# Patient Record
Sex: Male | Born: 1980 | Hispanic: No | Marital: Single | Smoking: Never smoker
Health system: Southern US, Community
[De-identification: ages and names within clinical notes are randomized; demographics above are authoritative.]

## PROBLEM LIST (undated history)

## (undated) DIAGNOSIS — F419 Anxiety disorder, unspecified: Secondary | ICD-10-CM

## (undated) DIAGNOSIS — F447 Conversion disorder with mixed symptom presentation: Secondary | ICD-10-CM

## (undated) DIAGNOSIS — S060X9A Concussion with loss of consciousness of unspecified duration, initial encounter: Secondary | ICD-10-CM

## (undated) DIAGNOSIS — E559 Vitamin D deficiency, unspecified: Secondary | ICD-10-CM

## (undated) DIAGNOSIS — F32A Depression, unspecified: Secondary | ICD-10-CM

## (undated) DIAGNOSIS — Z789 Other specified health status: Secondary | ICD-10-CM

## (undated) HISTORY — DX: Conversion disorder with mixed symptom presentation: F44.7

## (undated) HISTORY — DX: Vitamin D deficiency, unspecified: E55.9

## (undated) HISTORY — DX: Anxiety disorder, unspecified: F41.9

## (undated) HISTORY — DX: Depression, unspecified: F32.A

## (undated) HISTORY — PX: NO PAST SURGERIES: SHX2092

---
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
## 2019-01-07 ENCOUNTER — Emergency Department (HOSPITAL_COMMUNITY)
Admission: EM | Admit: 2019-01-07 | Discharge: 2019-01-07 | Disposition: A | Discharge status: Home / Self Care | Payer: BC Managed Care – PPO | Attending: Emergency Medicine | Admitting: Emergency Medicine

## 2019-01-07 ENCOUNTER — Other Ambulatory Visit: Payer: Self-pay

## 2019-01-07 ENCOUNTER — Emergency Department (HOSPITAL_COMMUNITY): Payer: BC Managed Care – PPO

## 2019-01-07 ENCOUNTER — Encounter (HOSPITAL_COMMUNITY): Payer: Self-pay

## 2019-01-07 DIAGNOSIS — Y929 Unspecified place or not applicable: Secondary | ICD-10-CM | POA: Diagnosis not present

## 2019-01-07 DIAGNOSIS — M546 Pain in thoracic spine: Secondary | ICD-10-CM | POA: Insufficient documentation

## 2019-01-07 DIAGNOSIS — R109 Unspecified abdominal pain: Secondary | ICD-10-CM | POA: Insufficient documentation

## 2019-01-07 DIAGNOSIS — T07XXXA Unspecified multiple injuries, initial encounter: Secondary | ICD-10-CM | POA: Diagnosis present

## 2019-01-07 DIAGNOSIS — S20419A Abrasion of unspecified back wall of thorax, initial encounter: Secondary | ICD-10-CM | POA: Insufficient documentation

## 2019-01-07 DIAGNOSIS — S8012XA Contusion of left lower leg, initial encounter: Secondary | ICD-10-CM | POA: Insufficient documentation

## 2019-01-07 DIAGNOSIS — S7001XA Contusion of right hip, initial encounter: Secondary | ICD-10-CM

## 2019-01-07 DIAGNOSIS — S0101XA Laceration without foreign body of scalp, initial encounter: Secondary | ICD-10-CM

## 2019-01-07 DIAGNOSIS — R51 Headache: Secondary | ICD-10-CM | POA: Diagnosis not present

## 2019-01-07 DIAGNOSIS — Y939 Activity, unspecified: Secondary | ICD-10-CM | POA: Insufficient documentation

## 2019-01-07 DIAGNOSIS — Y999 Unspecified external cause status: Secondary | ICD-10-CM | POA: Insufficient documentation

## 2019-01-07 DIAGNOSIS — Z23 Encounter for immunization: Secondary | ICD-10-CM | POA: Diagnosis not present

## 2019-01-07 HISTORY — DX: Other specified health status: Z78.9

## 2019-01-07 LAB — LACTIC ACID, PLASMA: Lactic Acid, Venous: 2.3 mmol/L (ref 0.5–1.9)

## 2019-01-07 LAB — I-STAT CHEM 8, ED
BUN: 14 mg/dL (ref 6–20)
Calcium, Ion: 1.14 mmol/L — ABNORMAL LOW (ref 1.15–1.40)
Chloride: 103 mmol/L (ref 98–111)
Creatinine, Ser: 1.1 mg/dL (ref 0.61–1.24)
Glucose, Bld: 109 mg/dL — ABNORMAL HIGH (ref 70–99)
HCT: 42 % (ref 39.0–52.0)
Hemoglobin: 14.3 g/dL (ref 13.0–17.0)
Potassium: 3.3 mmol/L — ABNORMAL LOW (ref 3.5–5.1)
Sodium: 138 mmol/L (ref 135–145)
TCO2: 27 mmol/L (ref 22–32)

## 2019-01-07 LAB — COMPREHENSIVE METABOLIC PANEL
ALT: 18 U/L (ref 0–44)
AST: 20 U/L (ref 15–41)
Albumin: 3.8 g/dL (ref 3.5–5.0)
Alkaline Phosphatase: 46 U/L (ref 38–126)
Anion gap: 10 (ref 5–15)
BUN: 12 mg/dL (ref 6–20)
CO2: 24 mmol/L (ref 22–32)
Calcium: 9.2 mg/dL (ref 8.9–10.3)
Chloride: 103 mmol/L (ref 98–111)
Creatinine, Ser: 0.94 mg/dL (ref 0.61–1.24)
GFR calc Af Amer: >60 mL/min (ref >60)
GFR calc non Af Amer: >60 mL/min (ref >60)
Glucose, Bld: 111 mg/dL — ABNORMAL HIGH (ref 70–99)
Potassium: 3.3 mmol/L — ABNORMAL LOW (ref 3.5–5.1)
Sodium: 137 mmol/L (ref 135–145)
Total Bilirubin: 0.9 mg/dL (ref 0.3–1.2)
Total Protein: 6.8 g/dL (ref 6.5–8.1)

## 2019-01-07 LAB — CBC
HCT: 42.1 % (ref 39.0–52.0)
Hemoglobin: 14.2 g/dL (ref 13.0–17.0)
MCH: 31.2 pg (ref 26.0–34.0)
MCHC: 33.7 g/dL (ref 30.0–36.0)
MCV: 92.5 fL (ref 80.0–100.0)
Platelets: 200 10*3/uL (ref 150–400)
RBC: 4.55 MIL/uL (ref 4.22–5.81)
RDW: 12.1 % (ref 11.5–15.5)
WBC: 6.3 10*3/uL (ref 4.0–10.5)
nRBC: 0 % (ref 0.0–0.2)

## 2019-01-07 LAB — ETHANOL: Alcohol, Ethyl (B): <10 mg/dL (ref <10)

## 2019-01-07 LAB — PROTIME-INR
INR: 1 (ref 0.8–1.2)
Prothrombin Time: 12.9 seconds (ref 11.4–15.2)

## 2019-01-07 MED ORDER — CEPHALEXIN 500 MG PO CAPS: 500.0000 mg | ORAL_CAPSULE | Freq: Three times a day (TID) | ORAL | 0 refills | Status: DC

## 2019-01-07 MED ORDER — TETANUS-DIPHTH-ACELL PERTUSSIS 5-2.5-18.5 LF-MCG/0.5 IM SUSP
0.5000 mL | Freq: Once | INTRAMUSCULAR | Status: AC
  Administered 2019-01-07: 0.5 mL via INTRAMUSCULAR
  Filled 2019-01-07: qty 0.5

## 2019-01-07 MED ORDER — IOHEXOL 300 MG/ML  SOLN
100.0000 mL | Freq: Once | INTRAMUSCULAR | Status: AC | PRN
  Administered 2019-01-07: 100 mL via INTRAVENOUS

## 2019-01-07 MED ORDER — IBUPROFEN 600 MG PO TABS: 600.0000 mg | ORAL_TABLET | Freq: Four times a day (QID) | ORAL | 0 refills | Status: DC | PRN

## 2019-01-07 MED ORDER — ONDANSETRON HCL 4 MG PO TABS: 4.0000 mg | ORAL_TABLET | Freq: Three times a day (TID) | ORAL | 0 refills | Status: DC | PRN

## 2019-01-07 MED ORDER — CEFAZOLIN SODIUM-DEXTROSE 2-4 GM/100ML-% IV SOLN
2.0000 g | Freq: Once | INTRAVENOUS | Status: AC
  Administered 2019-01-07: 2 g via INTRAVENOUS
  Filled 2019-01-07: qty 100

## 2019-01-07 MED ORDER — FENTANYL CITRATE (PF) 100 MCG/2ML IJ SOLN
50.0000 ug | Freq: Once | INTRAMUSCULAR | Status: AC
  Administered 2019-01-07: 50 ug via INTRAVENOUS
  Filled 2019-01-07: qty 2

## 2019-01-07 MED ORDER — SODIUM CHLORIDE 0.9 % IV BOLUS
1000.0000 mL | Freq: Once | INTRAVENOUS | Status: AC
  Administered 2019-01-07: 1000 mL via INTRAVENOUS

## 2019-01-07 MED ORDER — HYDROCODONE-ACETAMINOPHEN 5-325 MG PO TABS: 1.0000 | ORAL_TABLET | Freq: Four times a day (QID) | ORAL | 0 refills | Status: DC | PRN
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
## 2019-01-07 NOTE — Discharge Instructions (Addendum)
You have a laceration that was stapled. You need staple removal in a week   You have bruised hip and leg.   Take motrin 600 mg every 6 hrs for pain   Take vicodin for severe pain. You can take some zofran if you are nauseated   See your doctor  Return to ER if you have worse headaches, vomiting, trouble walking
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
## 2019-01-08 ENCOUNTER — Encounter (HOSPITAL_COMMUNITY): Payer: Self-pay

## 2019-01-13 ENCOUNTER — Emergency Department (HOSPITAL_COMMUNITY): Payer: BC Managed Care – PPO

## 2019-01-13 ENCOUNTER — Emergency Department (HOSPITAL_COMMUNITY)
Admission: EM | Admit: 2019-01-13 | Discharge: 2019-01-13 | Disposition: A | Discharge status: Home / Self Care | Payer: BC Managed Care – PPO | Attending: Emergency Medicine | Admitting: Emergency Medicine

## 2019-01-13 ENCOUNTER — Other Ambulatory Visit: Payer: Self-pay

## 2019-01-13 ENCOUNTER — Encounter (HOSPITAL_COMMUNITY): Payer: Self-pay

## 2019-01-13 DIAGNOSIS — M62838 Other muscle spasm: Secondary | ICD-10-CM | POA: Insufficient documentation

## 2019-01-13 DIAGNOSIS — S0101XD Laceration without foreign body of scalp, subsequent encounter: Secondary | ICD-10-CM | POA: Diagnosis not present

## 2019-01-13 DIAGNOSIS — M549 Dorsalgia, unspecified: Secondary | ICD-10-CM | POA: Diagnosis not present

## 2019-01-13 DIAGNOSIS — R51 Headache: Secondary | ICD-10-CM | POA: Diagnosis not present

## 2019-01-13 DIAGNOSIS — M25551 Pain in right hip: Secondary | ICD-10-CM | POA: Insufficient documentation

## 2019-01-13 DIAGNOSIS — S70212D Abrasion, left hip, subsequent encounter: Secondary | ICD-10-CM | POA: Diagnosis not present

## 2019-01-13 DIAGNOSIS — M542 Cervicalgia: Secondary | ICD-10-CM | POA: Diagnosis not present

## 2019-01-13 LAB — BASIC METABOLIC PANEL
Anion gap: 10 (ref 5–15)
BUN: 12 mg/dL (ref 6–20)
CO2: 26 mmol/L (ref 22–32)
Calcium: 9.5 mg/dL (ref 8.9–10.3)
Chloride: 99 mmol/L (ref 98–111)
Creatinine, Ser: 0.7 mg/dL (ref 0.61–1.24)
GFR calc Af Amer: >60 mL/min (ref >60)
GFR calc non Af Amer: >60 mL/min (ref >60)
Glucose, Bld: 129 mg/dL — ABNORMAL HIGH (ref 70–99)
Potassium: 3.5 mmol/L (ref 3.5–5.1)
Sodium: 135 mmol/L (ref 135–145)

## 2019-01-13 LAB — CBC
HCT: 41.3 % (ref 39.0–52.0)
Hemoglobin: 14.3 g/dL (ref 13.0–17.0)
MCH: 31.1 pg (ref 26.0–34.0)
MCHC: 34.6 g/dL (ref 30.0–36.0)
MCV: 89.8 fL (ref 80.0–100.0)
Platelets: 221 10*3/uL (ref 150–400)
RBC: 4.6 MIL/uL (ref 4.22–5.81)
RDW: 12.1 % (ref 11.5–15.5)
WBC: 5.8 10*3/uL (ref 4.0–10.5)
nRBC: 0 % (ref 0.0–0.2)

## 2019-01-13 MED ORDER — MELOXICAM 7.5 MG PO TABS: 7.5000 mg | ORAL_TABLET | Freq: Every day | ORAL | 0 refills | Status: AC

## 2019-01-13 MED ORDER — DIAZEPAM 5 MG/ML IJ SOLN: 5.0000 mg | Freq: Once | INTRAMUSCULAR | Status: DC

## 2019-01-13 MED ORDER — DIAZEPAM 5 MG PO TABS
5.0000 mg | ORAL_TABLET | Freq: Once | ORAL | Status: AC
  Administered 2019-01-13: 5 mg via ORAL
  Filled 2019-01-13: qty 1

## 2019-01-13 MED ORDER — SODIUM CHLORIDE 0.9% FLUSH: 3.0000 mL | Freq: Once | INTRAVENOUS | Status: DC

## 2019-01-13 MED ORDER — DIAZEPAM 5 MG PO TABS: 5.0000 mg | ORAL_TABLET | Freq: Two times a day (BID) | ORAL | 0 refills | Status: DC | PRN

## 2019-01-13 MED ORDER — HYDROCODONE-ACETAMINOPHEN 5-325 MG PO TABS
1.0000 | ORAL_TABLET | Freq: Once | ORAL | Status: AC
  Administered 2019-01-13: 1 via ORAL
  Filled 2019-01-13: qty 1
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
## 2019-01-13 NOTE — ED Notes (Signed)
Discharge instructions discussed with pt. Pt Verbalized understanding. Pt. Requesting work note.

## 2019-01-13 NOTE — Discharge Instructions (Addendum)
Follow up with your doctor for further care. Your CT scans from your first visit and from today are reassuring.  Take Valium as needed as prescribed for body aches and muscle spasm. Do NOT take Valium if driving or operating machinery.  Take Meloxicam as needed as prescribed for body pain.

## 2019-01-13 NOTE — ED Notes (Signed)
Work note provided for return to work 01/16/2019, light duty. PA informed to talk with pt.

## 2019-01-26 ENCOUNTER — Telehealth: Payer: Self-pay | Admitting: Family Medicine

## 2019-01-26 NOTE — Telephone Encounter (Signed)
LVM to schedule an appt per CRM

## 2019-01-30 NOTE — Telephone Encounter (Signed)
Pt calling back. Please call back as soon as possible.

## 2019-02-04 DIAGNOSIS — S060XAA Concussion with loss of consciousness status unknown, initial encounter: Secondary | ICD-10-CM

## 2019-02-04 DIAGNOSIS — S060X9A Concussion with loss of consciousness of unspecified duration, initial encounter: Secondary | ICD-10-CM

## 2019-02-04 HISTORY — DX: Concussion with loss of consciousness of unspecified duration, initial encounter: S06.0X9A

## 2019-02-04 HISTORY — DX: Concussion with loss of consciousness status unknown, initial encounter: S06.0XAA

## 2019-02-05 ENCOUNTER — Emergency Department (HOSPITAL_COMMUNITY)
Admission: EM | Admit: 2019-02-05 | Discharge: 2019-02-07 | Disposition: A | Discharge status: Home / Self Care | Payer: BC Managed Care – PPO | Attending: Emergency Medicine | Admitting: Emergency Medicine

## 2019-02-05 ENCOUNTER — Other Ambulatory Visit: Payer: Self-pay

## 2019-02-05 ENCOUNTER — Encounter (HOSPITAL_COMMUNITY): Payer: Self-pay | Admitting: Emergency Medicine

## 2019-02-05 ENCOUNTER — Emergency Department (HOSPITAL_COMMUNITY): Payer: BC Managed Care – PPO

## 2019-02-05 DIAGNOSIS — F322 Major depressive disorder, single episode, severe without psychotic features: Secondary | ICD-10-CM | POA: Insufficient documentation

## 2019-02-05 DIAGNOSIS — H538 Other visual disturbances: Secondary | ICD-10-CM | POA: Diagnosis not present

## 2019-02-05 DIAGNOSIS — R51 Headache: Secondary | ICD-10-CM | POA: Insufficient documentation

## 2019-02-05 DIAGNOSIS — R52 Pain, unspecified: Secondary | ICD-10-CM

## 2019-02-05 DIAGNOSIS — F0781 Postconcussional syndrome: Secondary | ICD-10-CM | POA: Insufficient documentation

## 2019-02-05 DIAGNOSIS — Z79899 Other long term (current) drug therapy: Secondary | ICD-10-CM | POA: Insufficient documentation

## 2019-02-05 DIAGNOSIS — F329 Major depressive disorder, single episode, unspecified: Secondary | ICD-10-CM

## 2019-02-05 DIAGNOSIS — M7918 Myalgia, other site: Secondary | ICD-10-CM | POA: Diagnosis not present

## 2019-02-05 DIAGNOSIS — R42 Dizziness and giddiness: Secondary | ICD-10-CM | POA: Diagnosis not present

## 2019-02-05 DIAGNOSIS — F32A Depression, unspecified: Secondary | ICD-10-CM

## 2019-02-05 LAB — RAPID URINE DRUG SCREEN, HOSP PERFORMED
Amphetamines: NOT DETECTED
Barbiturates: NOT DETECTED
Benzodiazepines: NOT DETECTED
Cocaine: NOT DETECTED
Opiates: NOT DETECTED
Tetrahydrocannabinol: NOT DETECTED

## 2019-02-05 LAB — COMPREHENSIVE METABOLIC PANEL
ALT: 17 U/L (ref 0–44)
AST: 19 U/L (ref 15–41)
Albumin: 4.1 g/dL (ref 3.5–5.0)
Alkaline Phosphatase: 41 U/L (ref 38–126)
Anion gap: 11 (ref 5–15)
BUN: 12 mg/dL (ref 6–20)
CO2: 24 mmol/L (ref 22–32)
Calcium: 9.1 mg/dL (ref 8.9–10.3)
Chloride: 102 mmol/L (ref 98–111)
Creatinine, Ser: 0.97 mg/dL (ref 0.61–1.24)
GFR calc Af Amer: >60 mL/min (ref >60)
GFR calc non Af Amer: >60 mL/min (ref >60)
Glucose, Bld: 92 mg/dL (ref 70–99)
Potassium: 3.4 mmol/L — ABNORMAL LOW (ref 3.5–5.1)
Sodium: 137 mmol/L (ref 135–145)
Total Bilirubin: 1.7 mg/dL — ABNORMAL HIGH (ref 0.3–1.2)
Total Protein: 7.4 g/dL (ref 6.5–8.1)

## 2019-02-05 LAB — CBC WITH DIFFERENTIAL/PLATELET
Abs Immature Granulocytes: 0 10*3/uL (ref 0.00–0.07)
Basophils Absolute: 0.1 10*3/uL (ref 0.0–0.1)
Basophils Relative: 1 %
Eosinophils Absolute: 0.1 10*3/uL (ref 0.0–0.5)
Eosinophils Relative: 2 %
HCT: 44.3 % (ref 39.0–52.0)
Hemoglobin: 15.2 g/dL (ref 13.0–17.0)
Immature Granulocytes: 0 %
Lymphocytes Relative: 34 %
Lymphs Abs: 1.5 10*3/uL (ref 0.7–4.0)
MCH: 31.1 pg (ref 26.0–34.0)
MCHC: 34.3 g/dL (ref 30.0–36.0)
MCV: 90.6 fL (ref 80.0–100.0)
Monocytes Absolute: 0.5 10*3/uL (ref 0.1–1.0)
Monocytes Relative: 10 %
Neutro Abs: 2.4 10*3/uL (ref 1.7–7.7)
Neutrophils Relative %: 53 %
Platelets: 201 10*3/uL (ref 150–400)
RBC: 4.89 MIL/uL (ref 4.22–5.81)
RDW: 12.1 % (ref 11.5–15.5)
WBC: 4.5 10*3/uL (ref 4.0–10.5)
nRBC: 0 % (ref 0.0–0.2)

## 2019-02-05 LAB — URINALYSIS, ROUTINE W REFLEX MICROSCOPIC
Bacteria, UA: NONE SEEN
Bilirubin Urine: NEGATIVE
Glucose, UA: NEGATIVE mg/dL
Ketones, ur: 20 mg/dL — AB
Leukocytes,Ua: NEGATIVE
Nitrite: NEGATIVE
Protein, ur: NEGATIVE mg/dL
Specific Gravity, Urine: 1.018 (ref 1.005–1.030)
pH: 6 (ref 5.0–8.0)

## 2019-02-05 LAB — CK: Total CK: 136 U/L (ref 49–397)

## 2019-02-05 LAB — TSH: TSH: 0.869 u[IU]/mL (ref 0.350–4.500)

## 2019-02-05 MED ORDER — KETOROLAC TROMETHAMINE 15 MG/ML IJ SOLN
15.0000 mg | Freq: Once | INTRAMUSCULAR | Status: AC
  Administered 2019-02-05: 13:00:00 15 mg via INTRAVENOUS
  Filled 2019-02-05: qty 1

## 2019-02-05 MED ORDER — DIAZEPAM 5 MG PO TABS
5.0000 mg | ORAL_TABLET | Freq: Two times a day (BID) | ORAL | Status: DC | PRN
  Administered 2019-02-05 – 2019-02-06 (×2): 5 mg via ORAL
  Filled 2019-02-05 (×2): qty 1

## 2019-02-05 MED ORDER — ONDANSETRON HCL 4 MG PO TABS
4.0000 mg | ORAL_TABLET | Freq: Once | ORAL | Status: AC
  Administered 2019-02-05: 4 mg via ORAL
  Filled 2019-02-05: qty 1
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
## 2019-02-05 NOTE — Discharge Instructions (Addendum)
It was our pleasure to provide your ER care today - we hope that you feel better.  Take acetaminophen or ibuprofen as need for pain.   Follow up with primary care doctor, psychiatry, and neurology in the coming week.   Return to ER if worse, new symptoms, new pain, fevers, trouble breathing, worsening depression or thoughts of self harm, other concern.

## 2019-02-05 NOTE — ED Notes (Signed)
Gave pt water for PO challenge  
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
## 2019-02-06 MED ORDER — OXYCODONE-ACETAMINOPHEN 5-325 MG PO TABS
1.0000 | ORAL_TABLET | Freq: Once | ORAL | Status: AC
  Administered 2019-02-06: 1 via ORAL
  Filled 2019-02-06: qty 1

## 2019-02-06 MED ORDER — POTASSIUM CHLORIDE CRYS ER 20 MEQ PO TBCR
40.0000 meq | EXTENDED_RELEASE_TABLET | Freq: Once | ORAL | Status: AC
  Administered 2019-02-06: 40 meq via ORAL
  Filled 2019-02-06: qty 2

## 2019-02-06 NOTE — ED Notes (Signed)
Case manager at [employer]

## 2019-02-06 NOTE — ED Notes (Signed)
Breakfast tray ordered 

## 2019-02-06 NOTE — Progress Notes (Signed)
Pt. meets criteria for inpatient treatment per Mordecai Maes, NP.  No appropriate beds available at [facility]. Referred out to the following hospitals:  [facility]  [facility]  [place]     CSW will continue to follow for Placement.  Areatha Keas. Judi Cong, LCSW

## 2019-02-06 NOTE — Progress Notes (Addendum)
12:50pm: Per Wilhemena Durie, PA inpatient psych placement is being pursued for this patient.  8:20am: CSW spoke with Velora Heckler of [employer] to discuss this patient's current need for Medicaid to pay for placement into an alternative family living (AFL) home. Crystal stated stated that due to this patient being in the ED that he is not eligible to be screened for any Medicaid or financial program that [facility] offers. Crystal stated if the patient were to qualify for straight Medicaid and not emergency, that it could take up to 90 days for coverage to begin.  CSW will continue working on safe discharge plan for patient.  Madilyn Fireman, MSW, LCSW-A Clinical Social Worker [facility] [phone]

## 2019-02-06 NOTE — ED Notes (Signed)
Per [facility] patient will either have to be IVC'd or leave AMA. Dr Ashok Cordia to evaluate pt to determine next steps

## 2019-02-06 NOTE — ED Notes (Signed)
Dinner tray arrived 

## 2019-02-06 NOTE — ED Notes (Signed)
[place] Education officer, museum updated on pt's disposition plan

## 2019-02-06 NOTE — ED Notes (Signed)
Breakfast tray arrived  
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
## 2019-02-06 NOTE — ED Notes (Signed)
Pt's "pork free" breakfast tray arrived.

## 2019-02-06 NOTE — ED Provider Notes (Signed)
Daily Rounding.  Please see previous provider for full H&P.  In short,  Shawn Edwards is a 38 y.o. male presents for headache and depression. No acute events overnight.   Physical Exam  BP 122/77 (BP Location: Right Arm)   Pulse 91   Temp 98.6 F (37 C) (Oral)   Resp 16   SpO2 98%   Physical Exam Vitals signs and nursing note reviewed.  Constitutional:      General: He is not in acute distress.    Appearance: He is well-developed.  HENT:     Head: Normocephalic and atraumatic.  Eyes:     General: No scleral icterus.       Right eye: No discharge.        Left eye: No discharge.     Conjunctiva/sclera: Conjunctivae normal.  Pulmonary:     Effort: Pulmonary effort is normal.  Neurological:     Mental Status: He is alert.    ED Course/Procedures     Procedures  MDM  Current plan is for inpatient behavioral health treatment. Placement is pending.        Darlin Drop [place], [place] 02/06/19 1233    Isla Pence, MD 02/06/19 1241

## 2019-02-06 NOTE — ED Notes (Signed)
Pt stated earlier that he "does not like pork or [place]". Pt's second lunch tray arrived.

## 2019-02-06 NOTE — ED Notes (Signed)
PT with pt.

## 2019-02-06 NOTE — ED Notes (Signed)
"  Pork free" lunch tray ordered
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
## 2019-02-06 NOTE — ED Notes (Signed)
TTS and translator machine at pt's bedside.
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
## 2019-02-06 NOTE — ED Notes (Signed)
Pt expressing desire to this RN and physical therapist that he may not want to stay, as he feels he like he will be better at home with his neighbors. Contacting [facility] to determine whether pt would meet criteria for IVC if he didn't want to stay

## 2019-02-06 NOTE — ED Notes (Signed)
Dinner tray ordered. Pt asked for chicken or beef (no pork or [place]).

## 2019-02-08 ENCOUNTER — Other Ambulatory Visit: Payer: Self-pay

## 2019-02-08 ENCOUNTER — Ambulatory Visit: Payer: BC Managed Care – PPO | Admitting: Neurology

## 2019-02-08 ENCOUNTER — Encounter: Payer: Self-pay | Admitting: Neurology

## 2019-02-08 VITALS — BP 128/88 | HR 96 | Temp 97.1°F

## 2019-02-08 DIAGNOSIS — F0781 Postconcussional syndrome: Secondary | ICD-10-CM | POA: Diagnosis not present

## 2019-02-08 MED ORDER — AMITRIPTYLINE HCL 25 MG PO TABS: 25.0000 mg | ORAL_TABLET | Freq: Every day | ORAL | 3 refills | Status: DC

## 2019-02-08 MED ORDER — ONDANSETRON HCL 4 MG PO TABS: 4.0000 mg | ORAL_TABLET | Freq: Three times a day (TID) | ORAL | 3 refills | Status: DC | PRN

## 2019-02-08 MED ORDER — IBUPROFEN 600 MG PO TABS: 600.0000 mg | ORAL_TABLET | Freq: Four times a day (QID) | ORAL | 2 refills | Status: DC | PRN

## 2019-02-08 MED ORDER — DIAZEPAM 5 MG PO TABS: 5.0000 mg | ORAL_TABLET | Freq: Two times a day (BID) | ORAL | 2 refills | Status: DC | PRN

## 2019-02-08 NOTE — Patient Instructions (Addendum)
- Rest is important in concussion recovery. Recommend shortened work days, working from home if he can, taking frequent breaks. No strenuous activity, limiting computer and reading time. - Continue heating pad and muscle relaxers prn for muscular relief - Will start amitriptyline at night. This may help with his headaches and insomnia as well as help with mood. - Concussion clinic - we will refer - He is [facility], I will refill his meds until he can see them - He is following up with [facility] for behavioral health services - Referred to Home [facility] Care for physical therapy and social services - Follow up in 3 months here with neurology    Post-Concussion Syndrome  A concussion is a brain injury from a direct hit (blow) to your head or body. This blow causes your brain to shake quickly back and forth inside your skull. This can damage brain cells and cause chemical changes in your brain. Concussions are usually not life-threatening but can cause several serious symptoms. Post-concussion syndrome is when symptoms that occur after a concussion last longer than normal. These symptoms can last from weeks to months. What are the causes? The cause of this condition is not known. It can happen whether your head injury was mild or severe. What increases the risk? You are more likely to develop this condition if:  You are male.  You are a child, teen, or young adult.  You had a past head injury.  You have a history of headaches.  You have depression or anxiety. What are the signs or symptoms? Physical symptoms  Headaches.  Tiredness.  Dizziness.  Weakness.  Blurry vision.  Sensitivity to light.  Hearing difficulties. Mental and emotional symptoms  Memory difficulties.  Difficulty with concentration.  Difficulty sleeping or staying asleep.  Feeling irritable.  Anxiety or depression.  Difficulty learning new things. How is this  diagnosed? This condition may be diagnosed based on:  Your symptoms.  A description of your injury.  Your medical history. Your health care provider may order other tests such as:  Brain function tests (neurological testing).  CT scan. How is this treated? Treatment for this condition may depend on your symptoms. Symptoms usually go away on their own over time. Treatments may include:  Medicines for headaches.  Resting your brain and body for a few days after your injury.  Rehabilitation therapy, such as: ? Physical or occupational therapy. This may include exercises to help with balance and dizziness. ? Mental health counseling. ? Speech therapy. ? Vision therapy. A brain and eye specialist can recommend treatments for vision problems. Follow these instructions at home: Medicines  Take over-the-counter and prescription medicines only as told by your health care provider.  Avoid opioid prescription pain medicines when recovering from a concussion. Activity  Limit your mental activities for the first few days after your injury, such as: ? Homework or job-related work. ? Complex thinking. ? Watching TV, and using a computer or phone. ? Playing memory games and puzzles. ? Gradually return to your normal activity level. If a certain activity brings on your symptoms, stop or slow down until you can do the activity without it triggering your symptoms.  Limit physical activity, such as exercise or sports, for the first few days after a concussion. Gradually return to normal activity as told by your health care provider. ? If a certain activity brings on your symptoms, stop or slow down until you can do the activity  without it triggering your symptoms.  Rest. Rest helps your brain heal. Make sure you: ? Get plenty of sleep at night. Most adults should get at least 7-9 hours of sleep each night. ? Rest during the day. Take naps or rest breaks when you feel tired.  Do not do  high-risk activities that could cause a second concussion, such as riding a bike or playing sports. Having another concussion before the first one has healed can be dangerous. General instructions  Do not drink alcohol until your health care provider says you can.  Keep track of the frequency and the severity of your symptoms. Give this information to your health care provider.  Keep all follow-up visits as directed by your health care provider. This is important. Contact a health care provider if:  Your symptoms do not improve.  You have another injury. Get help right away if you:  Have a severe or worsening headache.  Are confused.  Have trouble staying awake.  Pass out.  Vomit.  Have weakness or numbness in any part of your body.  Have a seizure.  Have trouble speaking. Summary  Post-concussion syndrome is when symptoms that occur after a concussion last longer than normal.  Symptoms usually go away on their own over time. Depending on your symptoms, you may need treatment, such as medicines or rehabilitation therapy.  Rest your brain and body for a few days after your injury. Gradually return to activities, as told by your health care provider.  Get plenty of sleep, and avoid alcohol and opioid pain medicines while recovering from a concussion. This information is not intended to replace advice given to you by your health care provider. Make sure you discuss any questions you have with your health care provider. Document Released: 01/02/2002 Document Revised: 05/05/2018 Document Reviewed: 08/17/2017 Elsevier Patient Education  [place].  Amitriptyline tablets What is this medicine? AMITRIPTYLINE (a mee TRIP ti leen) is used to treat depression. This medicine may be used for other purposes; ask your health care provider or pharmacist if you have questions. COMMON BRAND NAME(S): Elavil, Vanatrip What should I tell my health care provider before I take this  medicine? They need to know if you have any of these conditions:  an alcohol problem  asthma, difficulty breathing  bipolar disorder or schizophrenia  difficulty passing urine, prostate trouble  glaucoma  heart disease or previous heart attack  liver disease  over active thyroid  seizures  thoughts or plans of suicide, a previous suicide attempt, or family history of suicide attempt  an unusual or allergic reaction to amitriptyline, other medicines, foods, dyes, or preservatives  pregnant or trying to get pregnant  breast-feeding How should I use this medicine? Take this medicine by mouth with a drink of water. Follow the directions on the prescription label. You can take the tablets with or without food. Take your medicine at regular intervals. Do not take it more often than directed. Do not stop taking this medicine suddenly except upon the advice of your doctor. Stopping this medicine too quickly may cause serious side effects or your condition may worsen. A special MedGuide will be given to you by the pharmacist with each prescription and refill. Be sure to read this information carefully each time. Talk to your pediatrician regarding the use of this medicine in children. Special care may be needed. Overdosage: If you think you have taken too much of this medicine contact a poison control center or emergency room at  once. NOTE: This medicine is only for you. Do not share this medicine with others. What if I miss a dose? If you miss a dose, take it as soon as you can. If it is almost time for your next dose, take only that dose. Do not take double or extra doses. What may interact with this medicine? Do not take this medicine with any of the following medications:  arsenic trioxide  certain medicines used to regulate abnormal heartbeat or to treat other heart conditions  cisapride  droperidol  halofantrine  linezolid  MAOIs like Carbex, Eldepryl, Marplan, Nardil,  and Parnate  methylene blue  other medicines for mental depression  phenothiazines like perphenazine, thioridazine and chlorpromazine  pimozide  probucol  procarbazine  sparfloxacin  St. John's Wort This medicine may also interact with the following medications:  atropine and related drugs like hyoscyamine, scopolamine, tolterodine and others  barbiturate medicines for inducing sleep or treating seizures, like phenobarbital  cimetidine  disulfiram  ethchlorvynol  thyroid hormones such as levothyroxine  ziprasidone This list may not describe all possible interactions. Give your health care provider a list of all the medicines, herbs, non-prescription drugs, or dietary supplements you use. Also tell them if you smoke, drink alcohol, or use illegal drugs. Some items may interact with your medicine. What should I watch for while using this medicine? Tell your doctor if your symptoms do not get better or if they get worse. Visit your doctor or health care professional for regular checks on your progress. Because it may take several weeks to see the full effects of this medicine, it is important to continue your treatment as prescribed by your doctor. Patients and their families should watch out for new or worsening thoughts of suicide or depression. Also watch out for sudden changes in feelings such as feeling anxious, agitated, panicky, irritable, hostile, aggressive, impulsive, severely restless, overly excited and hyperactive, or not being able to sleep. If this happens, especially at the beginning of treatment or after a change in dose, call your health care professional. Bonita QuinYou may get drowsy or dizzy. Do not drive, use machinery, or do anything that needs mental alertness until you know how this medicine affects you. Do not stand or sit up quickly, especially if you are an older patient. This reduces the risk of dizzy or fainting spells. Alcohol may interfere with the effect of this  medicine. Avoid alcoholic drinks. Do not treat yourself for coughs, colds, or allergies without asking your doctor or health care professional for advice. Some ingredients can increase possible side effects. Your mouth may get dry. Chewing sugarless gum or sucking hard candy, and drinking plenty of water will help. Contact your doctor if the problem does not go away or is severe. This medicine may cause dry eyes and blurred vision. If you wear contact lenses you may feel some discomfort. Lubricating drops may help. See your eye doctor if the problem does not go away or is severe. This medicine can cause constipation. Try to have a bowel movement at least every 2 to 3 days. If you do not have a bowel movement for 3 days, call your doctor or health care professional. This medicine can make you more sensitive to the sun. Keep out of the sun. If you cannot avoid being in the sun, wear protective clothing and use sunscreen. Do not use sun lamps or tanning beds/booths. What side effects may I notice from receiving this medicine? Side effects that you should report to  your doctor or health care professional as soon as possible:  allergic reactions like skin rash, itching or hives, swelling of the face, lips, or tongue  anxious  breathing problems  changes in vision  confusion  elevated mood, decreased need for sleep, racing thoughts, impulsive behavior  eye pain  fast, irregular heartbeat  feeling faint or lightheaded, falls  feeling agitated, angry, or irritable  fever with increased sweating  hallucination, loss of contact with reality  seizures  stiff muscles  suicidal thoughts or other mood changes  tingling, pain, or numbness in the feet or hands  trouble passing urine or change in the amount of urine  trouble sleeping  unusually weak or tired  vomiting  yellowing of the eyes or skin Side effects that usually do not require medical attention (report to your doctor or  health care professional if they continue or are bothersome):  change in sex drive or performance  change in appetite or weight  constipation  dizziness  dry mouth  nausea  tired  tremors  upset stomach This list may not describe all possible side effects. Call your doctor for medical advice about side effects. You may report side effects to FDA at 1-800-FDA-1088. Where should I keep my medicine? Keep out of the reach of children. Store at room temperature between 20 and 25 degrees C (68 and 77 degrees F). Throw away any unused medicine after the expiration date. NOTE: This sheet is a summary. It may not cover all possible information. If you have questions about this medicine, talk to your doctor, pharmacist, or health care provider.  2020 Elsevier/Gold Standard (2018-07-05 13:04:32)
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
## 2019-02-09 ENCOUNTER — Telehealth: Payer: Self-pay
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
## 2019-02-14 ENCOUNTER — Other Ambulatory Visit: Payer: Self-pay

## 2019-02-14 ENCOUNTER — Encounter: Payer: Self-pay | Admitting: Registered Nurse

## 2019-02-14 ENCOUNTER — Ambulatory Visit (INDEPENDENT_AMBULATORY_CARE_PROVIDER_SITE_OTHER): Payer: BC Managed Care – PPO | Admitting: Registered Nurse

## 2019-02-14 VITALS — BP 120/82 | HR 98 | Temp 98.0°F | Resp 18

## 2019-02-14 DIAGNOSIS — F0781 Postconcussional syndrome: Secondary | ICD-10-CM

## 2019-02-14 DIAGNOSIS — K08119 Complete loss of teeth due to trauma, unspecified class: Secondary | ICD-10-CM

## 2019-02-14 MED ORDER — AMITRIPTYLINE HCL 25 MG PO TABS: 50.0000 mg | ORAL_TABLET | Freq: Every day | ORAL | 3 refills | Status: DC

## 2019-02-14 NOTE — Patient Instructions (Addendum)
   :                     .       50     .      .              .           .   If you have lab work done today you will be contacted with your lab results within the next 2 weeks.  If you have not heard from [place] then please contact us. The fastest way to get your results is to register for My Chart.   IF you received an x-ray today, you will receive an invoice from [facility]. Please contact [facility] at [phone] with questions or concerns regarding your invoice.   IF you received labwork today, you will receive an invoice from [facility]. Please contact [facility] at [phone] with questions or concerns regarding your invoice.   Our billing staff will not be able to assist you with questions regarding bills from these companies.  You will be contacted with the lab results as soon as they are available. The fastest way to get your results is to activate your My Chart account. Instructions are located on the last page of this paperwork. If you have not heard from [place] regarding the results in 2 weeks, please contact this office.
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
## 2019-02-15 ENCOUNTER — Telehealth: Payer: Self-pay | Admitting: Registered Nurse

## 2019-02-15 NOTE — Telephone Encounter (Signed)
General/Other - medication management  Please call the patient back. He is sleeping too much and would like a different medication. Please call

## 2019-02-17 NOTE — Telephone Encounter (Signed)
He should be taking this medication just before bedtime - I would also recommend that he not take it within 2 hours of taking his Valium. Additionally, there can be an adjustment period to a dose change for tricyclics - I'm hoping he can tolerate this until he sees me again on 03/14/19, otherwise, pain management clinic and neurology will be able to suggest alternatives when he continues to see them. If you could call to let him know, that would be great, thank you! Kathrin Ruddy, NP

## 2019-02-17 NOTE — Telephone Encounter (Signed)
Patient is requesting a different medication. Please advise

## 2019-02-17 NOTE — Telephone Encounter (Signed)
Left a msg for patient to return call. Please give patient below msg

## 2019-02-27 ENCOUNTER — Encounter: Payer: Self-pay | Admitting: Emergency Medicine

## 2019-02-27 NOTE — Telephone Encounter (Signed)
Letter has been mail. We were unable to reach patient by phone.

## 2019-03-07 ENCOUNTER — Encounter: Payer: Self-pay | Admitting: Registered Nurse

## 2019-03-07 ENCOUNTER — Other Ambulatory Visit: Payer: Self-pay

## 2019-03-07 ENCOUNTER — Ambulatory Visit (INDEPENDENT_AMBULATORY_CARE_PROVIDER_SITE_OTHER): Payer: BC Managed Care – PPO | Admitting: Registered Nurse

## 2019-03-07 VITALS — BP 126/86 | HR 112 | Temp 98.5°F | Resp 16

## 2019-03-07 DIAGNOSIS — F32A Depression, unspecified: Secondary | ICD-10-CM

## 2019-03-07 DIAGNOSIS — M62838 Other muscle spasm: Secondary | ICD-10-CM

## 2019-03-07 DIAGNOSIS — F0781 Postconcussional syndrome: Secondary | ICD-10-CM

## 2019-03-07 DIAGNOSIS — F329 Major depressive disorder, single episode, unspecified: Secondary | ICD-10-CM

## 2019-03-07 MED ORDER — CYCLOBENZAPRINE HCL 5 MG PO TABS: 10.0000 mg | ORAL_TABLET | Freq: Three times a day (TID) | ORAL | 2 refills | Status: DC | PRN

## 2019-03-07 MED ORDER — LORAZEPAM 0.5 MG PO TABS: 0.5000 mg | ORAL_TABLET | Freq: Two times a day (BID) | ORAL | 1 refills | Status: DC | PRN

## 2019-03-07 MED ORDER — AMITRIPTYLINE HCL 25 MG PO TABS: 50.0000 mg | ORAL_TABLET | Freq: Every day | ORAL | 3 refills | Status: DC

## 2019-03-07 NOTE — Patient Instructions (Signed)
° ° ° °  If you have lab work done today you will be contacted with your lab results within the next 2 weeks.  If you have not heard from us then please contact us. The fastest way to get your results is to register for My Chart. ° ° °IF you received an x-ray today, you will receive an invoice from [facility]. Please contact  Radiology at [phone] with questions or concerns regarding your invoice.  ° °IF you received labwork today, you will receive an invoice from [facility]. Please contact [facility] at [phone] with questions or concerns regarding your invoice.  ° °Our billing staff will not be able to assist you with questions regarding bills from these companies. ° °You will be contacted with the lab results as soon as they are available. The fastest way to get your results is to activate your My Chart account. Instructions are located on the last page of this paperwork. If you have not heard from us regarding the results in 2 weeks, please contact this office. °  ° ° ° °
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
## 2019-03-14 ENCOUNTER — Other Ambulatory Visit: Payer: Self-pay

## 2019-03-14 ENCOUNTER — Encounter: Payer: Self-pay | Admitting: Registered Nurse

## 2019-03-14 ENCOUNTER — Ambulatory Visit: Payer: BC Managed Care – PPO | Admitting: Registered Nurse

## 2019-03-14 VITALS — BP 108/62 | HR 80 | Temp 98.0°F | Resp 18

## 2019-03-14 DIAGNOSIS — F329 Major depressive disorder, single episode, unspecified: Secondary | ICD-10-CM

## 2019-03-14 DIAGNOSIS — F32A Depression, unspecified: Secondary | ICD-10-CM

## 2019-03-14 DIAGNOSIS — H539 Unspecified visual disturbance: Secondary | ICD-10-CM

## 2019-03-14 DIAGNOSIS — K08119 Complete loss of teeth due to trauma, unspecified class: Secondary | ICD-10-CM

## 2019-03-14 NOTE — Patient Instructions (Signed)
° ° ° °  If you have lab work done today you will be contacted with your lab results within the next 2 weeks.  If you have not heard from us then please contact us. The fastest way to get your results is to register for My Chart. ° ° °IF you received an x-ray today, you will receive an invoice from [facility]. Please contact  Radiology at [phone] with questions or concerns regarding your invoice.  ° °IF you received labwork today, you will receive an invoice from [facility]. Please contact [facility] at [phone] with questions or concerns regarding your invoice.  ° °Our billing staff will not be able to assist you with questions regarding bills from these companies. ° °You will be contacted with the lab results as soon as they are available. The fastest way to get your results is to activate your My Chart account. Instructions are located on the last page of this paperwork. If you have not heard from us regarding the results in 2 weeks, please contact this office. °  ° ° ° °
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
## 2019-03-15 ENCOUNTER — Other Ambulatory Visit: Payer: Self-pay | Admitting: Registered Nurse

## 2019-03-15 DIAGNOSIS — F0781 Postconcussional syndrome: Secondary | ICD-10-CM

## 2019-03-29 ENCOUNTER — Telehealth: Payer: Self-pay

## 2019-03-29 NOTE — Telephone Encounter (Signed)
Attempted to contact patient but his voicemail is not set up in order to leave a message.

## 2019-04-05 ENCOUNTER — Other Ambulatory Visit: Payer: Self-pay | Admitting: Registered Nurse

## 2019-04-05 DIAGNOSIS — G8929 Other chronic pain: Secondary | ICD-10-CM

## 2019-04-05 DIAGNOSIS — F32A Depression, unspecified: Secondary | ICD-10-CM

## 2019-04-05 DIAGNOSIS — F329 Major depressive disorder, single episode, unspecified: Secondary | ICD-10-CM

## 2019-04-10 ENCOUNTER — Encounter: Payer: Self-pay | Admitting: Registered Nurse

## 2019-04-10 ENCOUNTER — Telehealth: Payer: Self-pay

## 2019-04-10 ENCOUNTER — Ambulatory Visit (INDEPENDENT_AMBULATORY_CARE_PROVIDER_SITE_OTHER): Payer: BC Managed Care – PPO | Admitting: Registered Nurse

## 2019-04-10 ENCOUNTER — Other Ambulatory Visit: Payer: Self-pay

## 2019-04-10 VITALS — BP 122/82 | HR 89 | Temp 97.9°F | Resp 16

## 2019-04-10 DIAGNOSIS — M62838 Other muscle spasm: Secondary | ICD-10-CM

## 2019-04-10 DIAGNOSIS — F32A Depression, unspecified: Secondary | ICD-10-CM

## 2019-04-10 DIAGNOSIS — F329 Major depressive disorder, single episode, unspecified: Secondary | ICD-10-CM

## 2019-04-10 MED ORDER — SERTRALINE HCL 25 MG PO TABS: 25.0000 mg | ORAL_TABLET | Freq: Every day | ORAL | 0 refills | Status: DC

## 2019-04-10 MED ORDER — HYDROCODONE-ACETAMINOPHEN 5-325 MG PO TABS: 1.0000 | ORAL_TABLET | Freq: Four times a day (QID) | ORAL | 0 refills | Status: DC | PRN

## 2019-04-10 MED ORDER — ALPRAZOLAM 0.5 MG PO TABS: 0.5000 mg | ORAL_TABLET | Freq: Every evening | ORAL | 0 refills | Status: DC | PRN

## 2019-04-10 NOTE — Telephone Encounter (Signed)
Left message for patient to call back to discuss appointment in [facility].
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
## 2019-04-10 NOTE — Patient Instructions (Signed)
° ° ° °  If you have lab work done today you will be contacted with your lab results within the next 2 weeks.  If you have not heard from us then please contact us. The fastest way to get your results is to register for My Chart. ° ° °IF you received an x-ray today, you will receive an invoice from [facility]. Please contact [facility] at [phone] with questions or concerns regarding your invoice.  ° °IF you received labwork today, you will receive an invoice from [facility]. Please contact [facility] at [phone] with questions or concerns regarding your invoice.  ° °Our billing staff will not be able to assist you with questions regarding bills from these companies. ° °You will be contacted with the lab results as soon as they are available. The fastest way to get your results is to activate your My Chart account. Instructions are located on the last page of this paperwork. If you have not heard from us regarding the results in 2 weeks, please contact this office. °  ° ° ° °

## 2019-04-11 ENCOUNTER — Telehealth: Payer: Self-pay
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
## 2019-04-28 ENCOUNTER — Telehealth: Payer: Self-pay | Admitting: Registered Nurse

## 2019-04-28 NOTE — Telephone Encounter (Signed)
P[LEASE CALL PT REGARDING REFERRAL . [phone] / HE IS CONFUSED .

## 2019-05-03 NOTE — Telephone Encounter (Signed)
Spoke with pt and he states he still has not heard anything back from referrals about Dental,PT or Sport Med for appointments? Please advise. Is another referral needed for these services?

## 2019-05-08 ENCOUNTER — Ambulatory Visit (INDEPENDENT_AMBULATORY_CARE_PROVIDER_SITE_OTHER): Payer: BC Managed Care – PPO | Admitting: Registered Nurse

## 2019-05-08 ENCOUNTER — Ambulatory Visit (INDEPENDENT_AMBULATORY_CARE_PROVIDER_SITE_OTHER): Payer: BC Managed Care – PPO

## 2019-05-08 ENCOUNTER — Other Ambulatory Visit: Payer: Self-pay

## 2019-05-08 ENCOUNTER — Encounter: Payer: Self-pay | Admitting: Registered Nurse

## 2019-05-08 VITALS — BP 118/80 | HR 76 | Temp 97.8°F

## 2019-05-08 DIAGNOSIS — R202 Paresthesia of skin: Secondary | ICD-10-CM

## 2019-05-08 DIAGNOSIS — F32A Depression, unspecified: Secondary | ICD-10-CM

## 2019-05-08 DIAGNOSIS — F329 Major depressive disorder, single episode, unspecified: Secondary | ICD-10-CM

## 2019-05-08 DIAGNOSIS — R200 Anesthesia of skin: Secondary | ICD-10-CM

## 2019-05-08 DIAGNOSIS — K08119 Complete loss of teeth due to trauma, unspecified class: Secondary | ICD-10-CM

## 2019-05-08 DIAGNOSIS — F0781 Postconcussional syndrome: Secondary | ICD-10-CM

## 2019-05-08 DIAGNOSIS — M62838 Other muscle spasm: Secondary | ICD-10-CM

## 2019-05-08 MED ORDER — HYDROCODONE-ACETAMINOPHEN 5-325 MG PO TABS: 1.0000 | ORAL_TABLET | Freq: Four times a day (QID) | ORAL | 0 refills | Status: DC | PRN

## 2019-05-08 MED ORDER — ALPRAZOLAM 0.5 MG PO TABS: 0.5000 mg | ORAL_TABLET | Freq: Every evening | ORAL | 0 refills | Status: DC | PRN

## 2019-05-08 MED ORDER — SERTRALINE HCL 50 MG PO TABS: 50.0000 mg | ORAL_TABLET | Freq: Every day | ORAL | 0 refills | Status: DC

## 2019-05-08 MED ORDER — MELOXICAM 7.5 MG PO TABS: 7.5000 mg | ORAL_TABLET | Freq: Every day | ORAL | 1 refills | Status: DC

## 2019-05-08 MED ORDER — CYCLOBENZAPRINE HCL 5 MG PO TABS: 10.0000 mg | ORAL_TABLET | Freq: Three times a day (TID) | ORAL | 2 refills | Status: DC | PRN

## 2019-05-08 NOTE — Patient Instructions (Signed)
° ° ° °  If you have lab work done today you will be contacted with your lab results within the next 2 weeks.  If you have not heard from us then please contact us. The fastest way to get your results is to register for My Chart. ° ° °IF you received an x-ray today, you will receive an invoice from [facility]. Please contact [facility] at [phone] with questions or concerns regarding your invoice.  ° °IF you received labwork today, you will receive an invoice from [facility]. Please contact [facility] at [phone] with questions or concerns regarding your invoice.  ° °Our billing staff will not be able to assist you with questions regarding bills from these companies. ° °You will be contacted with the lab results as soon as they are available. The fastest way to get your results is to activate your My Chart account. Instructions are located on the last page of this paperwork. If you have not heard from us regarding the results in 2 weeks, please contact this office. °  ° ° ° °
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
## 2019-05-10 ENCOUNTER — Ambulatory Visit: Payer: BC Managed Care – PPO | Admitting: Family Medicine

## 2019-05-10 ENCOUNTER — Encounter: Payer: Self-pay | Admitting: Family Medicine

## 2019-05-10 ENCOUNTER — Other Ambulatory Visit: Payer: Self-pay

## 2019-05-10 VITALS — BP 128/98 | HR 79 | Temp 98.0°F | Ht 67.32 in

## 2019-05-10 DIAGNOSIS — F0781 Postconcussional syndrome: Secondary | ICD-10-CM

## 2019-05-10 DIAGNOSIS — R200 Anesthesia of skin: Secondary | ICD-10-CM | POA: Diagnosis not present

## 2019-05-10 DIAGNOSIS — F329 Major depressive disorder, single episode, unspecified: Secondary | ICD-10-CM

## 2019-05-10 DIAGNOSIS — F419 Anxiety disorder, unspecified: Secondary | ICD-10-CM | POA: Diagnosis not present
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
## 2019-05-10 NOTE — Patient Instructions (Addendum)
  We need to have you seen by concussion specialist  Please call Dr Georgie Chard office at [phone] to schedule appt  Continue follow up with PCP and psychiatry  Follow up as needed  Post-Concussion Syndrome  Post-concussion syndrome is when symptoms last longer than normal after a head injury. What are the signs or symptoms? After a head injury, you may:  Have headaches.  Feel tired.  Feel dizzy.  Feel weak.  Have trouble seeing.  Have trouble in bright lights.  Have trouble hearing.  Not be able to remember things.  Not be able to focus.  Have trouble sleeping.  Have mood swings.  Have trouble learning new things. These can last from weeks to months. Follow these instructions at home: Medicines  Take all medicines only as told by your doctor.  Do not take prescription pain medicines. Activity  Limit activities as told by your doctor. This includes: ? Homework. ? Job-related work. ? Thinking. ? Watching TV. ? Using a computer or phone. ? Puzzles. ? Exercise. ? Sports.  Slowly return to your normal activity as told by your doctor.  Stop an activity if you have symptoms.  Do not do anything that may cause you to get injured again. General instructions  Rest. Try to: ? Sleep 7-9 hours each night. ? Take naps or breaks when you feel tired during the day.  Do not drink alcohol until your doctor says that you can.  Keep track of your symptoms.  Keep all follow-up visits as told by your doctor. This is important. Contact a doctor if:  You do not improve.  You get worse.  You have another injury. Get help right away if:  You have a very bad headache.  You feel confused.  You feel very sleepy.  You pass out (faint).  You throw up (vomit).  You feel weak in any part of your body.  You feel numb in any part of your body.  You start shaking (have a seizure).  You have trouble talking. Summary  Post-concussion syndrome is when  symptoms last longer than normal after a head injury.  Limit all activity after your injury. Gradually return to normal activity as told by your doctor.  Rest, do not drink alcohol, and avoid prescription pain medicines after a concussion.  Call your doctor if your symptoms get worse. This information is not intended to replace advice given to you by your health care provider. Make sure you discuss any questions you have with your health care provider. Document Released: 08/20/2004 Document Revised: 05/05/2018 Document Reviewed: 08/17/2017 Elsevier Patient Education  2020 [employer].

## 2019-06-16 ENCOUNTER — Ambulatory Visit: Payer: BC Managed Care – PPO | Admitting: Neurology

## 2019-06-19 ENCOUNTER — Ambulatory Visit: Payer: BC Managed Care – PPO | Admitting: Registered Nurse

## 2019-06-20 ENCOUNTER — Other Ambulatory Visit: Payer: Self-pay

## 2019-06-20 ENCOUNTER — Telehealth (INDEPENDENT_AMBULATORY_CARE_PROVIDER_SITE_OTHER): Payer: BC Managed Care – PPO | Admitting: Registered Nurse

## 2019-06-20 DIAGNOSIS — K591 Functional diarrhea: Secondary | ICD-10-CM

## 2019-06-20 DIAGNOSIS — K08119 Complete loss of teeth due to trauma, unspecified class: Secondary | ICD-10-CM

## 2019-06-20 DIAGNOSIS — Z789 Other specified health status: Secondary | ICD-10-CM

## 2019-06-20 NOTE — Progress Notes (Signed)
Spoke with pt and he informed me that he has been having Diarrhea x 3 days and now having fatigue. Pt states he would like something to help with fatigue and sleep.

## 2019-06-21 MED ORDER — LOPERAMIDE HCL 2 MG PO TABS: 2.0000 mg | ORAL_TABLET | Freq: Four times a day (QID) | ORAL | 0 refills | Status: DC | PRN
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
## 2019-06-26 ENCOUNTER — Telehealth: Payer: Self-pay | Admitting: Registered Nurse

## 2019-06-26 NOTE — Telephone Encounter (Signed)
Copied from [place] [id number]. Topic: General - Other >> Jun 26, 2019 10:55 AM Carolyn Stare wrote: The req failed to the pharmacy please resend to pharmacy loperamide (IMODIUM A-D) 2 MG tablet, pt said there was also something for toxic that was suppose to be called in

## 2019-06-28 ENCOUNTER — Telehealth (INDEPENDENT_AMBULATORY_CARE_PROVIDER_SITE_OTHER): Payer: BC Managed Care – PPO | Admitting: Registered Nurse

## 2019-06-28 ENCOUNTER — Encounter: Payer: Self-pay | Admitting: Registered Nurse

## 2019-06-28 ENCOUNTER — Other Ambulatory Visit: Payer: Self-pay

## 2019-06-28 DIAGNOSIS — F329 Major depressive disorder, single episode, unspecified: Secondary | ICD-10-CM

## 2019-06-28 DIAGNOSIS — K591 Functional diarrhea: Secondary | ICD-10-CM | POA: Diagnosis not present

## 2019-06-28 DIAGNOSIS — M62838 Other muscle spasm: Secondary | ICD-10-CM

## 2019-06-28 DIAGNOSIS — F32A Depression, unspecified: Secondary | ICD-10-CM

## 2019-06-28 DIAGNOSIS — F0781 Postconcussional syndrome: Secondary | ICD-10-CM

## 2019-06-28 MED ORDER — LOPERAMIDE HCL 2 MG PO TABS: 2.0000 mg | ORAL_TABLET | Freq: Four times a day (QID) | ORAL | 0 refills | Status: DC | PRN

## 2019-06-28 MED ORDER — CYCLOBENZAPRINE HCL 5 MG PO TABS: 10.0000 mg | ORAL_TABLET | Freq: Three times a day (TID) | ORAL | 2 refills | Status: DC | PRN

## 2019-06-28 MED ORDER — AMITRIPTYLINE HCL 25 MG PO TABS: 50.0000 mg | ORAL_TABLET | Freq: Every day | ORAL | 3 refills | Status: DC

## 2019-06-28 MED ORDER — LORAZEPAM 0.5 MG PO TABS: 0.5000 mg | ORAL_TABLET | Freq: Two times a day (BID) | ORAL | 1 refills | Status: DC | PRN
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
## 2019-07-11 ENCOUNTER — Ambulatory Visit: Payer: BC Managed Care – PPO | Admitting: Physical Therapy

## 2019-07-18 ENCOUNTER — Encounter: Payer: Self-pay | Admitting: Neurology

## 2019-07-24 ENCOUNTER — Ambulatory Visit: Payer: BC Managed Care – PPO | Admitting: Physical Therapy

## 2019-07-24 ENCOUNTER — Telehealth: Payer: BC Managed Care – PPO | Admitting: Neurology

## 2019-07-31 ENCOUNTER — Telehealth: Payer: BC Managed Care – PPO | Admitting: Neurology
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
## 2019-08-04 ENCOUNTER — Ambulatory Visit: Payer: BC Managed Care – PPO | Admitting: Physical Therapy

## 2019-08-04 ENCOUNTER — Ambulatory Visit: Payer: BC Managed Care – PPO | Admitting: Neurology

## 2019-08-07 ENCOUNTER — Ambulatory Visit: Payer: BC Managed Care – PPO | Admitting: Registered Nurse
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
## 2019-08-11 ENCOUNTER — Ambulatory Visit: Payer: BC Managed Care – PPO | Admitting: Neurology

## 2019-08-11 ENCOUNTER — Other Ambulatory Visit: Payer: Self-pay

## 2019-08-16 ENCOUNTER — Ambulatory Visit (INDEPENDENT_AMBULATORY_CARE_PROVIDER_SITE_OTHER): Payer: BC Managed Care – PPO | Admitting: Registered Nurse

## 2019-08-16 ENCOUNTER — Other Ambulatory Visit: Payer: Self-pay

## 2019-08-16 ENCOUNTER — Encounter: Payer: Self-pay | Admitting: Registered Nurse

## 2019-08-16 VITALS — BP 116/72 | HR 83 | Temp 97.7°F | Resp 16 | Ht 67.0 in

## 2019-08-16 DIAGNOSIS — F329 Major depressive disorder, single episode, unspecified: Secondary | ICD-10-CM

## 2019-08-16 DIAGNOSIS — R112 Nausea with vomiting, unspecified: Secondary | ICD-10-CM

## 2019-08-16 DIAGNOSIS — G479 Sleep disorder, unspecified: Secondary | ICD-10-CM

## 2019-08-16 DIAGNOSIS — F32A Depression, unspecified: Secondary | ICD-10-CM

## 2019-08-16 DIAGNOSIS — F0781 Postconcussional syndrome: Secondary | ICD-10-CM | POA: Diagnosis not present

## 2019-08-16 MED ORDER — AMITRIPTYLINE HCL 50 MG PO TABS: 50.0000 mg | ORAL_TABLET | Freq: Every day | ORAL | 0 refills | Status: DC

## 2019-08-16 MED ORDER — ONDANSETRON HCL 4 MG PO TABS: 4.0000 mg | ORAL_TABLET | Freq: Three times a day (TID) | ORAL | 0 refills | Status: DC | PRN

## 2019-08-16 MED ORDER — ALPRAZOLAM 0.5 MG PO TABS: 0.5000 mg | ORAL_TABLET | Freq: Every evening | ORAL | 0 refills | Status: DC | PRN

## 2019-08-16 MED ORDER — SERTRALINE HCL 50 MG PO TABS: 50.0000 mg | ORAL_TABLET | Freq: Every day | ORAL | 3 refills | Status: DC

## 2019-08-16 NOTE — Patient Instructions (Addendum)
  Mr Kemmerer -   I am sorry for your loss. I am hoping you feel better soon.  I have reached out to four doctors to see who would be best to help you. I'll let you know what I find.  Please take the AMITRIPTYLINE every night.  Please take the SERTRALINE every morning.  Please take the [facility] before therapy or if you feel nauseous.  Please take the ALPRAZOLAM as needed for anxiety or trouble sleeping.  Please keep an eye out for phone calls and emails in the coming weeks.  I'll get you those letters to your e-mail.   If you have lab work done today you will be contacted with your lab results within the next 2 weeks.  If you have not heard from [place] then please contact us. The fastest way to get your results is to register for My Chart.   IF you received an x-ray today, you will receive an invoice from [facility]. Please contact [facility] at [phone] with questions or concerns regarding your invoice.   IF you received labwork today, you will receive an invoice from [facility]. Please contact [facility] at [phone] with questions or concerns regarding your invoice.   Our billing staff will not be able to assist you with questions regarding bills from these companies.  You will be contacted with the lab results as soon as they are available. The fastest way to get your results is to activate your My Chart account. Instructions are located on the last page of this paperwork. If you have not heard from [place] regarding the results in 2 weeks, please contact this office.

## 2019-08-18 ENCOUNTER — Encounter: Payer: Self-pay | Admitting: Registered Nurse
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
## 2019-08-24 ENCOUNTER — Ambulatory Visit: Payer: BC Managed Care – PPO | Admitting: Physical Therapy

## 2019-08-31 ENCOUNTER — Ambulatory Visit: Payer: BC Managed Care – PPO | Admitting: Physical Therapy

## 2019-08-31 ENCOUNTER — Telehealth: Payer: Self-pay | Admitting: Registered Nurse

## 2019-08-31 NOTE — Telephone Encounter (Signed)
Shawn Edwards with [facility] called. Pt was referred back in December patient has since scheduled 5 Consecutive appts and not shown.. Pt came in today to be seen [name] said she was willing to schedule another appt/ do you want him in Orthopedics or Neurological Rehab ..because nuerological would treat more of the Dizziness. Please email or call [name] [phone] page Shawn Edwards

## 2019-09-01 ENCOUNTER — Other Ambulatory Visit: Payer: Self-pay | Admitting: Registered Nurse

## 2019-09-01 DIAGNOSIS — F0781 Postconcussional syndrome: Secondary | ICD-10-CM

## 2019-09-01 DIAGNOSIS — R42 Dizziness and giddiness: Secondary | ICD-10-CM

## 2019-09-01 NOTE — Telephone Encounter (Signed)
Hi I have send you an e-mail per request. The pt is to be on both orthopedic and neurological rehab.   Thanks for your time and consideration.

## 2019-09-01 NOTE — Telephone Encounter (Signed)
Please advise to message from Dora from outpatient rehab.  She is asking if pt should be seen in Orthopedics or Neurological Rehab since the cc is Dizziness.

## 2019-09-01 NOTE — Telephone Encounter (Signed)
He likely needs a little bit of both - I'm working to get him set up with neuropsych testing and concussion specialists at this time as well.  Thank you  Jari Sportsman, NP

## 2019-09-11 ENCOUNTER — Ambulatory Visit: Payer: BC Managed Care – PPO

## 2019-09-13 ENCOUNTER — Encounter: Payer: Self-pay | Admitting: Psychology

## 2019-09-15 ENCOUNTER — Telehealth: Payer: Self-pay | Admitting: Registered Nurse

## 2019-09-15 ENCOUNTER — Other Ambulatory Visit: Payer: Self-pay

## 2019-09-15 ENCOUNTER — Telehealth: Payer: BC Managed Care – PPO | Admitting: Registered Nurse

## 2019-09-15 NOTE — Telephone Encounter (Signed)
Pt would like to pick up his medical records on 2/22 when he comes in for his appt.

## 2019-09-15 NOTE — Telephone Encounter (Signed)
I have records ready for pt to pick up on 09/18/19. He will need to fill a release out. I called pt and let him know.

## 2019-09-18 ENCOUNTER — Other Ambulatory Visit: Payer: Self-pay

## 2019-09-18 ENCOUNTER — Ambulatory Visit (INDEPENDENT_AMBULATORY_CARE_PROVIDER_SITE_OTHER): Payer: BC Managed Care – PPO | Admitting: Registered Nurse

## 2019-09-18 ENCOUNTER — Encounter: Payer: Self-pay | Admitting: Registered Nurse

## 2019-09-18 VITALS — BP 123/79 | HR 94 | Temp 98.0°F | Ht 67.0 in

## 2019-09-18 DIAGNOSIS — F329 Major depressive disorder, single episode, unspecified: Secondary | ICD-10-CM

## 2019-09-18 DIAGNOSIS — R430 Anosmia: Secondary | ICD-10-CM

## 2019-09-18 DIAGNOSIS — R112 Nausea with vomiting, unspecified: Secondary | ICD-10-CM

## 2019-09-18 DIAGNOSIS — F32A Depression, unspecified: Secondary | ICD-10-CM

## 2019-09-18 DIAGNOSIS — R42 Dizziness and giddiness: Secondary | ICD-10-CM | POA: Diagnosis not present

## 2019-09-18 DIAGNOSIS — Z13228 Encounter for screening for other metabolic disorders: Secondary | ICD-10-CM

## 2019-09-18 DIAGNOSIS — F0781 Postconcussional syndrome: Secondary | ICD-10-CM

## 2019-09-18 DIAGNOSIS — Z1329 Encounter for screening for other suspected endocrine disorder: Secondary | ICD-10-CM | POA: Diagnosis not present

## 2019-09-18 DIAGNOSIS — G479 Sleep disorder, unspecified: Secondary | ICD-10-CM

## 2019-09-18 DIAGNOSIS — Z13220 Encounter for screening for lipoid disorders: Secondary | ICD-10-CM

## 2019-09-18 DIAGNOSIS — Z130 Encounter for screening for diseases of the blood and blood-forming organs and certain disorders involving the immune mechanism: Secondary | ICD-10-CM

## 2019-09-18 DIAGNOSIS — L210 Seborrhea capitis: Secondary | ICD-10-CM

## 2019-09-18 MED ORDER — ONDANSETRON HCL 4 MG PO TABS: 4.0000 mg | ORAL_TABLET | Freq: Three times a day (TID) | ORAL | 0 refills | Status: DC | PRN

## 2019-09-18 MED ORDER — SERTRALINE HCL 100 MG PO TABS: 100.0000 mg | ORAL_TABLET | Freq: Every day | ORAL | 3 refills | Status: DC

## 2019-09-18 MED ORDER — AMITRIPTYLINE HCL 50 MG PO TABS: 50.0000 mg | ORAL_TABLET | Freq: Every day | ORAL | 0 refills | Status: DC

## 2019-09-18 NOTE — Patient Instructions (Signed)
° ° ° °  If you have lab work done today you will be contacted with your lab results within the next 2 weeks.  If you have not heard from us then please contact us. The fastest way to get your results is to register for My Chart. ° ° °IF you received an x-ray today, you will receive an invoice from [facility]. Please contact [facility] at [phone] with questions or concerns regarding your invoice.  ° °IF you received labwork today, you will receive an invoice from [facility]. Please contact [facility] at [phone] with questions or concerns regarding your invoice.  ° °Our billing staff will not be able to assist you with questions regarding bills from these companies. ° °You will be contacted with the lab results as soon as they are available. The fastest way to get your results is to activate your My Chart account. Instructions are located on the last page of this paperwork. If you have not heard from us regarding the results in 2 weeks, please contact this office. °  ° ° ° °

## 2019-09-19 ENCOUNTER — Encounter: Payer: Self-pay | Admitting: Radiology

## 2019-09-19 ENCOUNTER — Encounter: Payer: Self-pay | Admitting: Registered Nurse

## 2019-09-19 LAB — CBC WITH DIFFERENTIAL/PLATELET
Basophils Absolute: 0.1 10*3/uL (ref 0.0–0.2)
Basos: 1 %
EOS (ABSOLUTE): 0.1 10*3/uL (ref 0.0–0.4)
Eos: 2 %
Hematocrit: 44.1 % (ref 37.5–51.0)
Hemoglobin: 15.8 g/dL (ref 13.0–17.7)
Immature Grans (Abs): 0 10*3/uL (ref 0.0–0.1)
Immature Granulocytes: 0 %
Lymphocytes Absolute: 2.2 10*3/uL (ref 0.7–3.1)
Lymphs: 42 %
MCH: 32.2 pg (ref 26.6–33.0)
MCHC: 35.8 g/dL — ABNORMAL HIGH (ref 31.5–35.7)
MCV: 90 fL (ref 79–97)
Monocytes Absolute: 0.5 10*3/uL (ref 0.1–0.9)
Monocytes: 10 %
Neutrophils Absolute: 2.4 10*3/uL (ref 1.4–7.0)
Neutrophils: 45 %
Platelets: 216 10*3/uL (ref 150–450)
RBC: 4.91 x10E6/uL (ref 4.14–5.80)
RDW: 12.5 % (ref 11.6–15.4)
WBC: 5.3 10*3/uL (ref 3.4–10.8)

## 2019-09-19 LAB — LIPID PANEL
Chol/HDL Ratio: 3.1 ratio (ref 0.0–5.0)
Cholesterol, Total: 209 mg/dL — ABNORMAL HIGH (ref 100–199)
HDL: 68 mg/dL (ref >39)
LDL Chol Calc (NIH): 117 mg/dL — ABNORMAL HIGH (ref 0–99)
Triglycerides: 136 mg/dL (ref 0–149)
VLDL Cholesterol Cal: 24 mg/dL (ref 5–40)

## 2019-09-19 LAB — COMPREHENSIVE METABOLIC PANEL
ALT: 30 IU/L (ref 0–44)
AST: 21 IU/L (ref 0–40)
Albumin/Globulin Ratio: 1.8 (ref 1.2–2.2)
Albumin: 4.6 g/dL (ref 4.0–5.0)
Alkaline Phosphatase: 53 IU/L (ref 39–117)
BUN/Creatinine Ratio: 18 (ref 9–20)
BUN: 14 mg/dL (ref 6–24)
Bilirubin Total: 0.5 mg/dL (ref 0.0–1.2)
CO2: 24 mmol/L (ref 20–29)
Calcium: 9.5 mg/dL (ref 8.7–10.2)
Chloride: 102 mmol/L (ref 96–106)
Creatinine, Ser: 0.79 mg/dL (ref 0.76–1.27)
GFR calc Af Amer: 130 mL/min/{1.73_m2} (ref >59)
GFR calc non Af Amer: 112 mL/min/{1.73_m2} (ref >59)
Globulin, Total: 2.5 g/dL (ref 1.5–4.5)
Glucose: 97 mg/dL (ref 65–99)
Potassium: 3.8 mmol/L (ref 3.5–5.2)
Sodium: 140 mmol/L (ref 134–144)
Total Protein: 7.1 g/dL (ref 6.0–8.5)

## 2019-09-19 LAB — HEMOGLOBIN A1C
Est. average glucose Bld gHb Est-mCnc: 105 mg/dL
Hgb A1c MFr Bld: 5.3 % (ref 4.8–5.6)

## 2019-09-19 LAB — TSH: TSH: 2.19 u[IU]/mL (ref 0.450–4.500)

## 2019-09-19 LAB — NOVEL CORONAVIRUS, NAA: SARS-CoV-2, NAA: NOT DETECTED

## 2019-09-19 MED ORDER — KETOCONAZOLE 2 % EX SHAM: 1.0000 "application " | MEDICATED_SHAMPOO | CUTANEOUS | 0 refills | Status: DC
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
## 2019-09-19 NOTE — Progress Notes (Signed)
Good afternoon,  If we could send Mr. Hensch a normal results letter, that would be great.  Thank you  Jari Sportsman, NP

## 2019-09-20 ENCOUNTER — Encounter: Payer: Self-pay | Admitting: Physical Therapy

## 2019-09-20 ENCOUNTER — Other Ambulatory Visit: Payer: Self-pay

## 2019-09-20 ENCOUNTER — Telehealth: Payer: Self-pay | Admitting: Physical Therapy

## 2019-09-20 ENCOUNTER — Ambulatory Visit: Payer: BC Managed Care – PPO | Attending: Registered Nurse | Admitting: Physical Therapy

## 2019-09-20 DIAGNOSIS — R2689 Other abnormalities of gait and mobility: Secondary | ICD-10-CM | POA: Insufficient documentation

## 2019-09-20 DIAGNOSIS — M6281 Muscle weakness (generalized): Secondary | ICD-10-CM | POA: Diagnosis present

## 2019-09-20 DIAGNOSIS — R209 Unspecified disturbances of skin sensation: Secondary | ICD-10-CM | POA: Diagnosis present

## 2019-09-20 DIAGNOSIS — R208 Other disturbances of skin sensation: Secondary | ICD-10-CM

## 2019-09-20 DIAGNOSIS — M542 Cervicalgia: Secondary | ICD-10-CM | POA: Diagnosis present

## 2019-09-20 DIAGNOSIS — R278 Other lack of coordination: Secondary | ICD-10-CM | POA: Insufficient documentation

## 2019-09-20 DIAGNOSIS — R42 Dizziness and giddiness: Secondary | ICD-10-CM | POA: Diagnosis present

## 2019-09-20 DIAGNOSIS — R2681 Unsteadiness on feet: Secondary | ICD-10-CM | POA: Insufficient documentation
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
## 2019-09-20 NOTE — Telephone Encounter (Signed)
Absolutely - we've been working on setting up neuropsych testing for him and finally have a good concussion specialist that he will be able to see as well.  I'll gladly place those orders. I hope you found him to be as pleasant of a patient as I have.  Thank you  Jari Sportsman, NP

## 2019-09-20 NOTE — Telephone Encounter (Signed)
Hello Shawn Edwards, I performed Shawn Edwards's PT evaluation today for post-concussive syndrome.  I believe he would also benefit from a speech therapy evaluation for cognitive changes as well as occupation al therapy due to LUE weakness, impaired coordination and difficulty with ADL.  If you agree would you please enter two orders into Epic for Speech eval and treat and OT eval and treat?  Also, the cane he is using right now is a friends.  In order to get him his own, could you also put in a DME order for a straight cane?  Thank you and please let me know if you have any questions.  Dierdre Highman, PT, DPT 09/20/19    5:10 PM

## 2019-09-21 ENCOUNTER — Telehealth: Payer: Self-pay | Admitting: Family Medicine

## 2019-09-21 NOTE — Telephone Encounter (Signed)
[facility]  On March the 9 th at 8:30 . Patient needs to arrive at 8:00 am.  Patient will see Dr. Lucretia Roers Burn .

## 2019-09-21 NOTE — Telephone Encounter (Signed)
Patient has his apt with [facility]  On March the 9 th at 8:30 . Patient needs to arrive at 8:00 am.  Patient will see Dr. Lucretia Roers Burn .   [address]  [place] [address]

## 2019-09-22 ENCOUNTER — Ambulatory Visit: Payer: BC Managed Care – PPO | Admitting: Physical Therapy

## 2019-09-22 ENCOUNTER — Encounter: Payer: Self-pay | Admitting: Physical Therapy

## 2019-09-22 ENCOUNTER — Other Ambulatory Visit: Payer: Self-pay

## 2019-09-22 ENCOUNTER — Encounter: Payer: BC Managed Care – PPO | Admitting: Physical Therapy

## 2019-09-22 ENCOUNTER — Other Ambulatory Visit: Payer: Self-pay | Admitting: Registered Nurse

## 2019-09-22 DIAGNOSIS — R208 Other disturbances of skin sensation: Secondary | ICD-10-CM

## 2019-09-22 DIAGNOSIS — R2689 Other abnormalities of gait and mobility: Secondary | ICD-10-CM

## 2019-09-22 DIAGNOSIS — R278 Other lack of coordination: Secondary | ICD-10-CM

## 2019-09-22 DIAGNOSIS — M542 Cervicalgia: Secondary | ICD-10-CM

## 2019-09-22 DIAGNOSIS — R42 Dizziness and giddiness: Secondary | ICD-10-CM | POA: Diagnosis not present

## 2019-09-22 DIAGNOSIS — M6281 Muscle weakness (generalized): Secondary | ICD-10-CM

## 2019-09-22 DIAGNOSIS — R2681 Unsteadiness on feet: Secondary | ICD-10-CM

## 2019-09-22 DIAGNOSIS — F0781 Postconcussional syndrome: Secondary | ICD-10-CM
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
## 2019-09-22 NOTE — Telephone Encounter (Signed)
Just placed the orders!  Have a great weekend. Don't hesitate to reach out if there's anything else I can do for you!  Jari Sportsman, NP

## 2019-09-25 ENCOUNTER — Ambulatory Visit: Payer: BC Managed Care – PPO | Attending: Registered Nurse | Admitting: Physical Therapy

## 2019-09-25 ENCOUNTER — Other Ambulatory Visit: Payer: Self-pay

## 2019-09-25 DIAGNOSIS — M25512 Pain in left shoulder: Secondary | ICD-10-CM | POA: Diagnosis present

## 2019-09-25 DIAGNOSIS — R41841 Cognitive communication deficit: Secondary | ICD-10-CM | POA: Insufficient documentation

## 2019-09-25 DIAGNOSIS — G8929 Other chronic pain: Secondary | ICD-10-CM | POA: Insufficient documentation

## 2019-09-25 DIAGNOSIS — R278 Other lack of coordination: Secondary | ICD-10-CM | POA: Insufficient documentation

## 2019-09-25 DIAGNOSIS — R42 Dizziness and giddiness: Secondary | ICD-10-CM | POA: Insufficient documentation

## 2019-09-25 DIAGNOSIS — M6281 Muscle weakness (generalized): Secondary | ICD-10-CM | POA: Diagnosis present

## 2019-09-25 DIAGNOSIS — R4701 Aphasia: Secondary | ICD-10-CM | POA: Diagnosis present

## 2019-09-25 DIAGNOSIS — R2689 Other abnormalities of gait and mobility: Secondary | ICD-10-CM | POA: Insufficient documentation

## 2019-09-25 DIAGNOSIS — R2681 Unsteadiness on feet: Secondary | ICD-10-CM | POA: Insufficient documentation

## 2019-09-25 DIAGNOSIS — R208 Other disturbances of skin sensation: Secondary | ICD-10-CM

## 2019-09-25 DIAGNOSIS — R209 Unspecified disturbances of skin sensation: Secondary | ICD-10-CM | POA: Insufficient documentation

## 2019-09-25 DIAGNOSIS — R41840 Attention and concentration deficit: Secondary | ICD-10-CM | POA: Diagnosis present

## 2019-09-25 DIAGNOSIS — M542 Cervicalgia: Secondary | ICD-10-CM | POA: Diagnosis present

## 2019-09-25 DIAGNOSIS — R41842 Visuospatial deficit: Secondary | ICD-10-CM | POA: Diagnosis present

## 2019-09-26 ENCOUNTER — Encounter: Payer: Self-pay | Admitting: Registered Nurse

## 2019-09-26 ENCOUNTER — Telehealth: Payer: Self-pay | Admitting: Registered Nurse

## 2019-09-26 NOTE — Telephone Encounter (Signed)
Letter has been sent  Thank you  Jari Sportsman, NP

## 2019-09-26 NOTE — Telephone Encounter (Signed)
Please Advise
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
## 2019-09-26 NOTE — Telephone Encounter (Signed)
PATIENT IS CALLING BACK REGARDING A LETTER HE SENT TO PROVIDERS EMAIL  NEEDS TO TALK TO NURSE OR PROVIDER ASAP.

## 2019-09-27 ENCOUNTER — Ambulatory Visit: Payer: BC Managed Care – PPO | Admitting: Occupational Therapy

## 2019-09-27 ENCOUNTER — Other Ambulatory Visit: Payer: Self-pay

## 2019-09-27 ENCOUNTER — Encounter: Payer: Self-pay | Admitting: Occupational Therapy

## 2019-09-27 DIAGNOSIS — R2681 Unsteadiness on feet: Secondary | ICD-10-CM

## 2019-09-27 DIAGNOSIS — R41842 Visuospatial deficit: Secondary | ICD-10-CM

## 2019-09-27 DIAGNOSIS — G8929 Other chronic pain: Secondary | ICD-10-CM

## 2019-09-27 DIAGNOSIS — R208 Other disturbances of skin sensation: Secondary | ICD-10-CM

## 2019-09-27 DIAGNOSIS — M25512 Pain in left shoulder: Secondary | ICD-10-CM

## 2019-09-27 DIAGNOSIS — M6281 Muscle weakness (generalized): Secondary | ICD-10-CM

## 2019-09-27 DIAGNOSIS — R42 Dizziness and giddiness: Secondary | ICD-10-CM | POA: Diagnosis not present

## 2019-09-27 DIAGNOSIS — R41840 Attention and concentration deficit: Secondary | ICD-10-CM
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
## 2019-10-02 ENCOUNTER — Encounter: Payer: Self-pay | Admitting: Physical Therapy

## 2019-10-02 ENCOUNTER — Other Ambulatory Visit: Payer: Self-pay

## 2019-10-02 ENCOUNTER — Telehealth: Payer: Self-pay

## 2019-10-02 ENCOUNTER — Ambulatory Visit: Payer: BC Managed Care – PPO | Admitting: Physical Therapy

## 2019-10-02 DIAGNOSIS — M542 Cervicalgia: Secondary | ICD-10-CM

## 2019-10-02 DIAGNOSIS — R2681 Unsteadiness on feet: Secondary | ICD-10-CM

## 2019-10-02 DIAGNOSIS — R2689 Other abnormalities of gait and mobility: Secondary | ICD-10-CM

## 2019-10-02 DIAGNOSIS — R208 Other disturbances of skin sensation: Secondary | ICD-10-CM

## 2019-10-02 DIAGNOSIS — R42 Dizziness and giddiness: Secondary | ICD-10-CM | POA: Diagnosis not present

## 2019-10-02 DIAGNOSIS — R278 Other lack of coordination: Secondary | ICD-10-CM

## 2019-10-02 DIAGNOSIS — M6281 Muscle weakness (generalized): Secondary | ICD-10-CM
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
## 2019-10-03 ENCOUNTER — Other Ambulatory Visit: Payer: Self-pay

## 2019-10-03 ENCOUNTER — Ambulatory Visit (INDEPENDENT_AMBULATORY_CARE_PROVIDER_SITE_OTHER): Payer: BC Managed Care – PPO | Admitting: Registered Nurse

## 2019-10-03 ENCOUNTER — Encounter: Payer: Self-pay | Admitting: Registered Nurse

## 2019-10-03 VITALS — BP 148/88 | HR 83 | Temp 98.1°F | Ht 67.0 in

## 2019-10-03 DIAGNOSIS — F0781 Postconcussional syndrome: Secondary | ICD-10-CM | POA: Diagnosis not present

## 2019-10-03 NOTE — Progress Notes (Signed)
Established Patient Office Visit  Subjective:  Patient ID: Shawn Edwards, male    DOB: Nov 15, 1980  Age: 39 y.o. MRN: [id number]  CC:  Chief Complaint  Patient presents with  . Dizziness    patient states that he does not like what the neurologist did at his appointment this morning. per patient he also still can not taste or smell but was negative for covid 19. feelings like he can only drink not eat    HPI Shawn Edwards presents for specialist follow up  Was seen by [facility] this morning and was extremely displeased with the service he received over there. He did not feel that the provider listened to his concerns and states that he felt rushed and worthless. He is tearful in the room today.  He is interested in continuing to look for a neurologist that will be able to provide help - we are hoping to get him over to [facility].  He has been following with PT-rehab for a few weeks now and is very happy with how well that's going. He likes the specialists he has seen over there and is extremely grateful for their care.  Additionally, he needs further documentation regarding his hearings about his residency in the [place] - I do not feel he is quite ready to handle a court date, as such, I will provide him with documentation  Past Medical History:  Diagnosis Date  . Concussion 02/04/2019  . Known health problems: none     Past Surgical History:  Procedure Laterality Date  . NO PAST SURGERIES      No family history on file.  Social History   Socioeconomic History  . Marital status: Single    Spouse name: Not on file  . Number of children: 0  . Years of education: Not on file  . Highest education level: Associate degree: academic program  Occupational History  . Not on file  Tobacco Use  . Smoking status: Never Smoker  . Smokeless tobacco: Never Used  Substance and Sexual Activity  . Alcohol use: Never  . Drug use: Never  . Sexual activity: Not on file  Other  Topics Concern  . Not on file  Social History Narrative   Lives alone   Right handed   Caffeine: sometimes soda   Social Determinants of Health   Financial Resource Strain:   . Difficulty of Paying Living Expenses: Not on file  Food Insecurity:   . Worried About Programme researcher, broadcasting/film/video in the Last Year: Not on file  . Ran Out of Food in the Last Year: Not on file  Transportation Needs:   . Lack of Transportation (Medical): Not on file  . Lack of Transportation (Non-Medical): Not on file  Physical Activity:   . Days of Exercise per Week: Not on file  . Minutes of Exercise per Session: Not on file  Stress:   . Feeling of Stress : Not on file  Social Connections:   . Frequency of Communication with Friends and Family: Not on file  . Frequency of Social Gatherings with Friends and Family: Not on file  . Attends Religious Services: Not on file  . Active Member of Clubs or Organizations: Not on file  . Attends Banker Meetings: Not on file  . Marital Status: Not on file  Intimate Partner Violence:   . Fear of Current or Ex-Partner: Not on file  . Emotionally Abused: Not on file  . Physically Abused: Not  on file  . Sexually Abused: Not on file    Outpatient Medications Prior to Visit  Medication Sig Dispense Refill  . ALPRAZolam (XANAX) 0.5 MG tablet Take 1 tablet (0.5 mg total) by mouth at bedtime as needed for anxiety. 60 tablet 0  . amitriptyline (ELAVIL) 50 MG tablet Take 1 tablet (50 mg total) by mouth at bedtime. 90 tablet 0  . cyclobenzaprine (FLEXERIL) 5 MG tablet Take 2 tablets (10 mg total) by mouth 3 (three) times daily as needed for muscle spasms. 30 tablet 2  . HYDROcodone-acetaminophen (NORCO/VICODIN) 5-325 MG tablet Take 1 tablet by mouth every 6 (six) hours as needed for moderate pain. 30 tablet 0  . ibuprofen (ADVIL) 600 MG tablet Take 1 tablet (600 mg total) by mouth every 6 (six) hours as needed. 30 tablet 2  . ketoconazole (NIZORAL) 2 % shampoo Apply 1  application topically 2 (two) times a week. 120 mL 0  . loperamide (IMODIUM A-D) 2 MG tablet Take 1 tablet (2 mg total) by mouth 4 (four) times daily as needed for diarrhea or loose stools. 30 tablet 0  . LORazepam (ATIVAN) 0.5 MG tablet Take 1 tablet (0.5 mg total) by mouth 2 (two) times daily as needed for anxiety. 30 tablet 1  . ondansetron (ZOFRAN) 4 MG tablet Take 1 tablet (4 mg total) by mouth every 8 (eight) hours as needed for nausea or vomiting. 20 tablet 0  . sertraline (ZOLOFT) 100 MG tablet Take 1 tablet (100 mg total) by mouth daily. 90 tablet 3   No facility-administered medications prior to visit.    Allergies  Allergen Reactions  . Pork-Derived Products     ROS Review of Systems  Constitutional: Positive for fatigue. Negative for activity change, appetite change, chills, diaphoresis, fever and unexpected weight change.  Eyes: Positive for visual disturbance. Negative for photophobia, pain, discharge, redness and itching.  Cardiovascular: Negative.   Gastrointestinal: Negative.   Endocrine: Negative.   Musculoskeletal: Negative.   Skin: Negative.   Allergic/Immunologic: Negative.   Neurological: Positive for dizziness, speech difficulty, weakness, light-headedness, numbness and headaches.  Hematological: Negative.   Psychiatric/Behavioral: Positive for confusion, decreased concentration, dysphoric mood and sleep disturbance. The patient is nervous/anxious.   All other systems reviewed and are negative.     Objective:    Physical Exam  Constitutional: He is oriented to person, place, and time. He appears well-developed and well-nourished. No distress.  Cardiovascular: Normal rate and regular rhythm.  Pulmonary/Chest: Effort normal. No respiratory distress.  Neurological: He is alert and oriented to person, place, and time.  Skin: Skin is warm and dry. No rash noted. He is not diaphoretic. No erythema. No pallor.  Psychiatric: He has a normal mood and affect. His  behavior is normal. Judgment and thought content normal.  Nursing note and vitals reviewed.   BP (!) 148/88   Pulse 83   Temp 98.1 F (36.7 C) (Temporal)   Ht 5\' 7"  (1.702 m)   SpO2 97%   BMI 23.82 kg/m  Wt Readings from Last 3 Encounters:  01/13/19 152 lb 1.9 oz (69 kg)  01/07/19 149 lb 14.6 oz (68 kg)     Health Maintenance Due  Topic Date Due  . HIV Screening  07/10/1994    There are no preventive care reminders to display for this patient.  Lab Results  Component Value Date   TSH 2.190 09/18/2019   Lab Results  Component Value Date   WBC 5.3 09/18/2019   HGB 15.8  09/18/2019   HCT 44.1 09/18/2019   MCV 90 09/18/2019   PLT 216 09/18/2019   Lab Results  Component Value Date   NA 140 09/18/2019   K 3.8 09/18/2019   CO2 24 09/18/2019   GLUCOSE 97 09/18/2019   BUN 14 09/18/2019   CREATININE 0.79 09/18/2019   BILITOT 0.5 09/18/2019   ALKPHOS 53 09/18/2019   AST 21 09/18/2019   ALT 30 09/18/2019   PROT 7.1 09/18/2019   ALBUMIN 4.6 09/18/2019   CALCIUM 9.5 09/18/2019   ANIONGAP 11 02/05/2019   Lab Results  Component Value Date   CHOL 209 (H) 09/18/2019   Lab Results  Component Value Date   HDL 68 09/18/2019   Lab Results  Component Value Date   LDLCALC 117 (H) 09/18/2019   Lab Results  Component Value Date   TRIG 136 09/18/2019   Lab Results  Component Value Date   CHOLHDL 3.1 09/18/2019   Lab Results  Component Value Date   HGBA1C 5.3 09/18/2019      Assessment & Plan:   Problem List Items Addressed This Visit      Nervous and Auditory   Post concussive syndrome - Primary   Relevant Orders   Ambulatory referral to Neurology      No orders of the defined types were placed in this encounter.   Follow-up: No follow-ups on file.   PLAN  Urgent referral to [facility]  Filled out forms per pt request  Continue follow up q 4 weeks  Patient encouraged to call clinic with any questions, comments, or concerns.  Janeece Agee, NP

## 2019-10-03 NOTE — Patient Instructions (Signed)
° ° ° °  If you have lab work done today you will be contacted with your lab results within the next 2 weeks.  If you have not heard from us then please contact us. The fastest way to get your results is to register for My Chart. ° ° °IF you received an x-ray today, you will receive an invoice from [facility]. Please contact [facility] at [phone] with questions or concerns regarding your invoice.  ° °IF you received labwork today, you will receive an invoice from [facility]. Please contact [facility] at [phone] with questions or concerns regarding your invoice.  ° °Our billing staff will not be able to assist you with questions regarding bills from these companies. ° °You will be contacted with the lab results as soon as they are available. The fastest way to get your results is to activate your My Chart account. Instructions are located on the last page of this paperwork. If you have not heard from us regarding the results in 2 weeks, please contact this office. °  ° ° ° °

## 2019-10-04 ENCOUNTER — Ambulatory Visit: Payer: BC Managed Care – PPO | Admitting: Occupational Therapy

## 2019-10-04 ENCOUNTER — Encounter: Payer: Self-pay | Admitting: Occupational Therapy

## 2019-10-04 DIAGNOSIS — R2681 Unsteadiness on feet: Secondary | ICD-10-CM

## 2019-10-04 DIAGNOSIS — R41840 Attention and concentration deficit: Secondary | ICD-10-CM

## 2019-10-04 DIAGNOSIS — R278 Other lack of coordination: Secondary | ICD-10-CM

## 2019-10-04 DIAGNOSIS — R41842 Visuospatial deficit: Secondary | ICD-10-CM

## 2019-10-04 DIAGNOSIS — M6281 Muscle weakness (generalized): Secondary | ICD-10-CM

## 2019-10-04 DIAGNOSIS — M25512 Pain in left shoulder: Secondary | ICD-10-CM

## 2019-10-04 DIAGNOSIS — R42 Dizziness and giddiness: Secondary | ICD-10-CM | POA: Diagnosis not present

## 2019-10-04 DIAGNOSIS — R208 Other disturbances of skin sensation: Secondary | ICD-10-CM

## 2019-10-04 DIAGNOSIS — G8929 Other chronic pain: Secondary | ICD-10-CM
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
## 2019-10-05 ENCOUNTER — Ambulatory Visit: Payer: BC Managed Care – PPO | Admitting: Physical Therapy

## 2019-10-05 ENCOUNTER — Ambulatory Visit: Payer: BC Managed Care – PPO

## 2019-10-05 ENCOUNTER — Other Ambulatory Visit: Payer: Self-pay

## 2019-10-05 ENCOUNTER — Encounter: Payer: Self-pay | Admitting: Physical Therapy

## 2019-10-05 DIAGNOSIS — R42 Dizziness and giddiness: Secondary | ICD-10-CM

## 2019-10-05 DIAGNOSIS — R2689 Other abnormalities of gait and mobility: Secondary | ICD-10-CM

## 2019-10-05 DIAGNOSIS — M6281 Muscle weakness (generalized): Secondary | ICD-10-CM

## 2019-10-05 DIAGNOSIS — M542 Cervicalgia: Secondary | ICD-10-CM

## 2019-10-05 DIAGNOSIS — R278 Other lack of coordination: Secondary | ICD-10-CM

## 2019-10-05 DIAGNOSIS — R41841 Cognitive communication deficit: Secondary | ICD-10-CM

## 2019-10-05 DIAGNOSIS — R2681 Unsteadiness on feet: Secondary | ICD-10-CM
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
## 2019-10-06 ENCOUNTER — Ambulatory Visit: Payer: BC Managed Care – PPO | Admitting: Registered Nurse
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
## 2019-10-09 ENCOUNTER — Ambulatory Visit: Payer: BC Managed Care – PPO | Admitting: Physical Therapy

## 2019-10-09 ENCOUNTER — Other Ambulatory Visit: Payer: Self-pay

## 2019-10-09 ENCOUNTER — Encounter: Payer: BC Managed Care – PPO | Admitting: Physical Therapy

## 2019-10-09 DIAGNOSIS — M542 Cervicalgia: Secondary | ICD-10-CM

## 2019-10-09 DIAGNOSIS — R42 Dizziness and giddiness: Secondary | ICD-10-CM

## 2019-10-09 DIAGNOSIS — M6281 Muscle weakness (generalized): Secondary | ICD-10-CM

## 2019-10-09 DIAGNOSIS — R2689 Other abnormalities of gait and mobility: Secondary | ICD-10-CM

## 2019-10-09 DIAGNOSIS — R2681 Unsteadiness on feet: Secondary | ICD-10-CM

## 2019-10-09 DIAGNOSIS — R278 Other lack of coordination: Secondary | ICD-10-CM
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
## 2019-10-11 ENCOUNTER — Ambulatory Visit: Payer: BC Managed Care – PPO | Admitting: Occupational Therapy

## 2019-10-11 ENCOUNTER — Encounter: Payer: Self-pay | Admitting: Occupational Therapy

## 2019-10-11 ENCOUNTER — Other Ambulatory Visit: Payer: Self-pay

## 2019-10-11 DIAGNOSIS — R208 Other disturbances of skin sensation: Secondary | ICD-10-CM

## 2019-10-11 DIAGNOSIS — R278 Other lack of coordination: Secondary | ICD-10-CM

## 2019-10-11 DIAGNOSIS — G8929 Other chronic pain: Secondary | ICD-10-CM

## 2019-10-11 DIAGNOSIS — R2681 Unsteadiness on feet: Secondary | ICD-10-CM

## 2019-10-11 DIAGNOSIS — M25512 Pain in left shoulder: Secondary | ICD-10-CM

## 2019-10-11 DIAGNOSIS — R42 Dizziness and giddiness: Secondary | ICD-10-CM | POA: Diagnosis not present

## 2019-10-11 DIAGNOSIS — M6281 Muscle weakness (generalized): Secondary | ICD-10-CM

## 2019-10-11 DIAGNOSIS — R41840 Attention and concentration deficit: Secondary | ICD-10-CM

## 2019-10-11 DIAGNOSIS — R41842 Visuospatial deficit: Secondary | ICD-10-CM
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
## 2019-10-13 ENCOUNTER — Ambulatory Visit: Payer: BC Managed Care – PPO | Admitting: Physical Therapy

## 2019-10-13 ENCOUNTER — Other Ambulatory Visit: Payer: Self-pay

## 2019-10-13 DIAGNOSIS — M542 Cervicalgia: Secondary | ICD-10-CM

## 2019-10-13 DIAGNOSIS — R2689 Other abnormalities of gait and mobility: Secondary | ICD-10-CM

## 2019-10-13 DIAGNOSIS — R2681 Unsteadiness on feet: Secondary | ICD-10-CM

## 2019-10-13 DIAGNOSIS — M6281 Muscle weakness (generalized): Secondary | ICD-10-CM

## 2019-10-13 DIAGNOSIS — R42 Dizziness and giddiness: Secondary | ICD-10-CM | POA: Diagnosis not present
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
## 2019-10-16 ENCOUNTER — Ambulatory Visit: Payer: BC Managed Care – PPO | Admitting: Registered Nurse

## 2019-10-17 ENCOUNTER — Ambulatory Visit: Payer: BC Managed Care – PPO | Admitting: Occupational Therapy

## 2019-10-17 ENCOUNTER — Encounter: Payer: Self-pay | Admitting: Occupational Therapy

## 2019-10-17 ENCOUNTER — Other Ambulatory Visit: Payer: Self-pay

## 2019-10-17 DIAGNOSIS — R2681 Unsteadiness on feet: Secondary | ICD-10-CM

## 2019-10-17 DIAGNOSIS — G8929 Other chronic pain: Secondary | ICD-10-CM

## 2019-10-17 DIAGNOSIS — M6281 Muscle weakness (generalized): Secondary | ICD-10-CM

## 2019-10-17 DIAGNOSIS — R278 Other lack of coordination: Secondary | ICD-10-CM

## 2019-10-17 DIAGNOSIS — R41842 Visuospatial deficit: Secondary | ICD-10-CM

## 2019-10-17 DIAGNOSIS — R42 Dizziness and giddiness: Secondary | ICD-10-CM | POA: Diagnosis not present

## 2019-10-17 DIAGNOSIS — R208 Other disturbances of skin sensation: Secondary | ICD-10-CM

## 2019-10-17 DIAGNOSIS — R41840 Attention and concentration deficit: Secondary | ICD-10-CM
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
## 2019-10-18 ENCOUNTER — Ambulatory Visit: Payer: BC Managed Care – PPO

## 2019-10-18 ENCOUNTER — Ambulatory Visit: Payer: BC Managed Care – PPO | Admitting: Physical Therapy

## 2019-10-18 DIAGNOSIS — M6281 Muscle weakness (generalized): Secondary | ICD-10-CM

## 2019-10-18 DIAGNOSIS — M542 Cervicalgia: Secondary | ICD-10-CM

## 2019-10-18 DIAGNOSIS — R42 Dizziness and giddiness: Secondary | ICD-10-CM

## 2019-10-18 DIAGNOSIS — R2681 Unsteadiness on feet: Secondary | ICD-10-CM

## 2019-10-18 DIAGNOSIS — R278 Other lack of coordination: Secondary | ICD-10-CM

## 2019-10-18 DIAGNOSIS — R208 Other disturbances of skin sensation: Secondary | ICD-10-CM

## 2019-10-18 DIAGNOSIS — R2689 Other abnormalities of gait and mobility: Secondary | ICD-10-CM
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
## 2019-10-20 ENCOUNTER — Ambulatory Visit: Payer: BC Managed Care – PPO | Admitting: Physical Therapy

## 2019-10-20 ENCOUNTER — Other Ambulatory Visit: Payer: Self-pay | Admitting: Registered Nurse

## 2019-10-20 ENCOUNTER — Telehealth: Payer: Self-pay | Admitting: Physical Therapy

## 2019-10-20 ENCOUNTER — Ambulatory Visit: Payer: BC Managed Care – PPO

## 2019-10-20 ENCOUNTER — Other Ambulatory Visit: Payer: Self-pay

## 2019-10-20 VITALS — BP 141/86 | HR 89

## 2019-10-20 DIAGNOSIS — R42 Dizziness and giddiness: Secondary | ICD-10-CM

## 2019-10-20 DIAGNOSIS — R208 Other disturbances of skin sensation: Secondary | ICD-10-CM

## 2019-10-20 DIAGNOSIS — R2689 Other abnormalities of gait and mobility: Secondary | ICD-10-CM

## 2019-10-20 DIAGNOSIS — R278 Other lack of coordination: Secondary | ICD-10-CM

## 2019-10-20 DIAGNOSIS — R41841 Cognitive communication deficit: Secondary | ICD-10-CM

## 2019-10-20 DIAGNOSIS — M542 Cervicalgia: Secondary | ICD-10-CM

## 2019-10-20 DIAGNOSIS — R4701 Aphasia: Secondary | ICD-10-CM

## 2019-10-20 DIAGNOSIS — M6281 Muscle weakness (generalized): Secondary | ICD-10-CM

## 2019-10-20 DIAGNOSIS — R2681 Unsteadiness on feet: Secondary | ICD-10-CM

## 2019-10-20 MED ORDER — MECLIZINE HCL 12.5 MG PO TABS: 12.5000 mg | ORAL_TABLET | Freq: Three times a day (TID) | ORAL | 0 refills | Status: DC | PRN

## 2019-10-20 NOTE — Telephone Encounter (Signed)
Hello Shawn Edwards, Shawn Edwards is still having severe dizziness and it is continuing to limit how much be can participate in therapy and his mobility at home.  Most of the sessions he spends with eyes closed and when I have attempted to perform a vestibular evaluation or treatments it ends with him becoming very nauseous and vomiting once.    I was wondering what your thoughts would be on prescribing Meclizine as needed for days when he is coming to therapy.  Do you think it would be indicated or do you think it would be helpful for improving his activity tolerance in therapy?  Please let me know what your thoughts are.  Thank you for your help! Dierdre Highman, PT, DPT 10/20/19    2:46 PM
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
## 2019-10-20 NOTE — Telephone Encounter (Signed)
I can absolutely do that. I'll make sure he takes it before coming in for therapy. He is continuing to work on finding a neurologist - unfortunately we haven't had luck through [facility] or [facility] - we're hoping [facility] will be the answer. Once that gets underway, I'm hoping for some more improvement on that front.  Thank you  Rich
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
## 2019-10-23 ENCOUNTER — Ambulatory Visit: Payer: BC Managed Care – PPO | Admitting: Occupational Therapy

## 2019-10-23 ENCOUNTER — Other Ambulatory Visit: Payer: Self-pay | Admitting: Registered Nurse

## 2019-10-23 ENCOUNTER — Telehealth: Payer: Self-pay | Admitting: Occupational Therapy

## 2019-10-23 ENCOUNTER — Encounter: Payer: Self-pay | Admitting: Occupational Therapy

## 2019-10-23 ENCOUNTER — Other Ambulatory Visit: Payer: Self-pay

## 2019-10-23 DIAGNOSIS — H8190 Unspecified disorder of vestibular function, unspecified ear: Secondary | ICD-10-CM

## 2019-10-23 DIAGNOSIS — R2681 Unsteadiness on feet: Secondary | ICD-10-CM

## 2019-10-23 DIAGNOSIS — M6281 Muscle weakness (generalized): Secondary | ICD-10-CM

## 2019-10-23 DIAGNOSIS — G8929 Other chronic pain: Secondary | ICD-10-CM

## 2019-10-23 DIAGNOSIS — R41840 Attention and concentration deficit: Secondary | ICD-10-CM

## 2019-10-23 DIAGNOSIS — R208 Other disturbances of skin sensation: Secondary | ICD-10-CM

## 2019-10-23 DIAGNOSIS — R278 Other lack of coordination: Secondary | ICD-10-CM

## 2019-10-23 DIAGNOSIS — H818X9 Other disorders of vestibular function, unspecified ear: Secondary | ICD-10-CM

## 2019-10-23 DIAGNOSIS — M25512 Pain in left shoulder: Secondary | ICD-10-CM

## 2019-10-23 DIAGNOSIS — R41842 Visuospatial deficit: Secondary | ICD-10-CM

## 2019-10-23 DIAGNOSIS — R42 Dizziness and giddiness: Secondary | ICD-10-CM | POA: Diagnosis not present
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
## 2019-10-26 ENCOUNTER — Ambulatory Visit: Payer: BC Managed Care – PPO | Attending: Registered Nurse | Admitting: Physical Therapy

## 2019-10-26 ENCOUNTER — Encounter: Payer: Self-pay | Admitting: Physical Therapy

## 2019-10-26 ENCOUNTER — Ambulatory Visit: Payer: BC Managed Care – PPO | Admitting: Psychology

## 2019-10-26 ENCOUNTER — Other Ambulatory Visit: Payer: Self-pay

## 2019-10-26 VITALS — BP 132/98 | HR 71

## 2019-10-26 DIAGNOSIS — R4701 Aphasia: Secondary | ICD-10-CM | POA: Diagnosis present

## 2019-10-26 DIAGNOSIS — R209 Unspecified disturbances of skin sensation: Secondary | ICD-10-CM | POA: Insufficient documentation

## 2019-10-26 DIAGNOSIS — M6281 Muscle weakness (generalized): Secondary | ICD-10-CM | POA: Diagnosis present

## 2019-10-26 DIAGNOSIS — R42 Dizziness and giddiness: Secondary | ICD-10-CM | POA: Insufficient documentation

## 2019-10-26 DIAGNOSIS — R2681 Unsteadiness on feet: Secondary | ICD-10-CM | POA: Insufficient documentation

## 2019-10-26 DIAGNOSIS — R41840 Attention and concentration deficit: Secondary | ICD-10-CM | POA: Diagnosis present

## 2019-10-26 DIAGNOSIS — R2689 Other abnormalities of gait and mobility: Secondary | ICD-10-CM | POA: Diagnosis present

## 2019-10-26 DIAGNOSIS — R41841 Cognitive communication deficit: Secondary | ICD-10-CM | POA: Insufficient documentation

## 2019-10-26 DIAGNOSIS — M25512 Pain in left shoulder: Secondary | ICD-10-CM | POA: Insufficient documentation

## 2019-10-26 DIAGNOSIS — G8929 Other chronic pain: Secondary | ICD-10-CM | POA: Insufficient documentation

## 2019-10-26 DIAGNOSIS — R278 Other lack of coordination: Secondary | ICD-10-CM | POA: Insufficient documentation

## 2019-10-26 DIAGNOSIS — M542 Cervicalgia: Secondary | ICD-10-CM | POA: Insufficient documentation

## 2019-10-26 DIAGNOSIS — R41842 Visuospatial deficit: Secondary | ICD-10-CM | POA: Insufficient documentation
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
## 2019-10-31 ENCOUNTER — Ambulatory Visit: Payer: BC Managed Care – PPO | Admitting: Physical Therapy

## 2019-10-31 ENCOUNTER — Ambulatory Visit (INDEPENDENT_AMBULATORY_CARE_PROVIDER_SITE_OTHER): Payer: BC Managed Care – PPO | Admitting: Registered Nurse

## 2019-10-31 ENCOUNTER — Other Ambulatory Visit: Payer: Self-pay

## 2019-10-31 VITALS — BP 129/88 | HR 85 | Temp 98.2°F

## 2019-10-31 DIAGNOSIS — B351 Tinea unguium: Secondary | ICD-10-CM

## 2019-10-31 DIAGNOSIS — K047 Periapical abscess without sinus: Secondary | ICD-10-CM

## 2019-10-31 DIAGNOSIS — R278 Other lack of coordination: Secondary | ICD-10-CM

## 2019-10-31 DIAGNOSIS — M542 Cervicalgia: Secondary | ICD-10-CM

## 2019-10-31 DIAGNOSIS — R112 Nausea with vomiting, unspecified: Secondary | ICD-10-CM | POA: Diagnosis not present

## 2019-10-31 DIAGNOSIS — M6281 Muscle weakness (generalized): Secondary | ICD-10-CM | POA: Diagnosis not present

## 2019-10-31 DIAGNOSIS — R42 Dizziness and giddiness: Secondary | ICD-10-CM | POA: Diagnosis not present

## 2019-10-31 DIAGNOSIS — G44329 Chronic post-traumatic headache, not intractable: Secondary | ICD-10-CM | POA: Diagnosis not present

## 2019-10-31 DIAGNOSIS — R2681 Unsteadiness on feet: Secondary | ICD-10-CM

## 2019-10-31 DIAGNOSIS — R2689 Other abnormalities of gait and mobility: Secondary | ICD-10-CM

## 2019-10-31 MED ORDER — AMOXICILLIN-POT CLAVULANATE 875-125 MG PO TABS: 1.0000 | ORAL_TABLET | Freq: Two times a day (BID) | ORAL | 0 refills | Status: DC

## 2019-10-31 MED ORDER — BUTALBITAL-APAP-CAFFEINE 50-325-40 MG PO TABS: 1.0000 | ORAL_TABLET | Freq: Four times a day (QID) | ORAL | 0 refills | Status: DC | PRN

## 2019-10-31 MED ORDER — MECLIZINE HCL 12.5 MG PO TABS: 12.5000 mg | ORAL_TABLET | Freq: Three times a day (TID) | ORAL | 0 refills | Status: DC | PRN

## 2019-10-31 MED ORDER — ONDANSETRON HCL 4 MG PO TABS: 4.0000 mg | ORAL_TABLET | Freq: Three times a day (TID) | ORAL | 0 refills | Status: DC | PRN

## 2019-10-31 MED ORDER — TERBINAFINE HCL 250 MG PO TABS: 250.0000 mg | ORAL_TABLET | Freq: Every day | ORAL | 0 refills | Status: DC

## 2019-10-31 NOTE — Patient Instructions (Signed)
° ° ° °  If you have lab work done today you will be contacted with your lab results within the next 2 weeks.  If you have not heard from us then please contact us. The fastest way to get your results is to register for My Chart. ° ° °IF you received an x-ray today, you will receive an invoice from [facility]. Please contact [facility] at [phone] with questions or concerns regarding your invoice.  ° °IF you received labwork today, you will receive an invoice from [facility]. Please contact [facility] at [phone] with questions or concerns regarding your invoice.  ° °Our billing staff will not be able to assist you with questions regarding bills from these companies. ° °You will be contacted with the lab results as soon as they are available. The fastest way to get your results is to activate your My Chart account. Instructions are located on the last page of this paperwork. If you have not heard from us regarding the results in 2 weeks, please contact this office. °  ° ° ° °
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
## 2019-11-01 ENCOUNTER — Encounter: Payer: Self-pay | Admitting: Occupational Therapy

## 2019-11-01 ENCOUNTER — Ambulatory Visit: Payer: BC Managed Care – PPO | Admitting: Occupational Therapy

## 2019-11-01 DIAGNOSIS — R41842 Visuospatial deficit: Secondary | ICD-10-CM

## 2019-11-01 DIAGNOSIS — R278 Other lack of coordination: Secondary | ICD-10-CM

## 2019-11-01 DIAGNOSIS — R208 Other disturbances of skin sensation: Secondary | ICD-10-CM

## 2019-11-01 DIAGNOSIS — R2681 Unsteadiness on feet: Secondary | ICD-10-CM

## 2019-11-01 DIAGNOSIS — R41840 Attention and concentration deficit: Secondary | ICD-10-CM

## 2019-11-01 DIAGNOSIS — M6281 Muscle weakness (generalized): Secondary | ICD-10-CM | POA: Diagnosis not present

## 2019-11-01 DIAGNOSIS — M25512 Pain in left shoulder: Secondary | ICD-10-CM

## 2019-11-01 DIAGNOSIS — G8929 Other chronic pain: Secondary | ICD-10-CM
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
## 2019-11-03 ENCOUNTER — Ambulatory Visit: Payer: BC Managed Care – PPO

## 2019-11-03 ENCOUNTER — Other Ambulatory Visit: Payer: Self-pay

## 2019-11-03 DIAGNOSIS — R4701 Aphasia: Secondary | ICD-10-CM

## 2019-11-03 DIAGNOSIS — R41841 Cognitive communication deficit: Secondary | ICD-10-CM

## 2019-11-03 DIAGNOSIS — M6281 Muscle weakness (generalized): Secondary | ICD-10-CM | POA: Diagnosis not present
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
## 2019-11-06 ENCOUNTER — Ambulatory Visit: Payer: BC Managed Care – PPO | Admitting: Speech Pathology

## 2019-11-06 ENCOUNTER — Ambulatory Visit: Payer: BC Managed Care – PPO | Admitting: Physical Therapy

## 2019-11-06 ENCOUNTER — Other Ambulatory Visit: Payer: Self-pay

## 2019-11-06 DIAGNOSIS — R41841 Cognitive communication deficit: Secondary | ICD-10-CM

## 2019-11-06 DIAGNOSIS — M6281 Muscle weakness (generalized): Secondary | ICD-10-CM | POA: Diagnosis not present

## 2019-11-06 DIAGNOSIS — R2689 Other abnormalities of gait and mobility: Secondary | ICD-10-CM

## 2019-11-06 DIAGNOSIS — R4701 Aphasia: Secondary | ICD-10-CM

## 2019-11-06 NOTE — Therapy (Signed)
[facility] [address] [place], [place], [address] Phone: [phone]   Fax:  [phone]  Patient Details  Name: Shawn Edwards MRN: [id number] Date of Birth: Jun 04, 1981 Referring Provider:  Janeece Agee, NP  Encounter Date: 11/06/2019 Pt presents today with multiple concerns and questions about his upcoming court date.  Through interpreter pt states that his lawyer was not able to get his court date moved to later and that he will likely have to go to court in [place] on Thursday.  States that the lawyer said she received the letters from MD, PT too late.  Pt very anxious and concerned about going to court because he is not able to tolerate long distance travel, is not able to enter/exit the courthouse without assistance, has difficulty communicating during the hearing and is worried he will not be able to answer their questions due to his memory being impaired.  He is worried he will get dizzy and vomit again.  Pt is also very worried because he has no transportation or someone to assist him physically with mobility.  Pt asking if therapist has any information about [facility] transportation that would be able to take him and help him (ambulance).    Attempted to answer questions within therapist's scope of knowledge.  This therapist is unaware of any transportation system that assists citizens to/from court dates if they are not able to transport themselves.  Discussed with pt that ambulance would likely be very expensive.  Discussed use of [employer] or [employer] - pt is able to use these services but again, would not have physical assistance into or out of the court building and he is concerned about dizziness, nausea and vomiting with the longer car ride.  Discussed with pt that therapy (PT/OT/Speech) is only able to assist by providing documentation that objectively describes his current functional abilities and limitations to the lawyer.   Educated pt on process for obtaining further documentation (visit notes) as proof of physical limitations: sign release of information and then have a formal request sent to medical records.  Also provided pt with business card with PT, OT and speech therapists' names listed if lawyer has specific questions.  Encouraged pt to have more in depth conversation with lawyer about transportation and mobility barriers and discuss if there are any court transportation or assistance options.  Also discussed situation with speech therapist prior to Speech therapy session.    Due to amount of time spent answering questions and discussing with pt and interpreter, no exercises performed today; will perform at next visit.  Dierdre Highman, PT, DPT 11/06/19    3:29 PM       [facility] [address] [place], [place], [address] Phone: [phone]   Fax:  [phone]
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
## 2019-11-06 NOTE — Patient Instructions (Signed)
  Kallan is having trouble with memory, attention, processing language (spoken and written) and decision making  Below are some strategies to help him understand and process what is going on in court:  Speak slowly with pauses in between utterances   Give Yashua extra time to process information and questions  Repeat information and questions a few different ways to improve his understanding  Have Tywan repeat back what he thinks he has heard and what he is understanding  Reduce background noises and lights as much as possible  If Lunden is not understanding, have the interpreter write a key words 1 at a time in very large print - he can't focus on small print or long phrases/sentence  Provide all of the information in writing so Cypher can have a friend review it with him to help him remember  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

## 2019-11-07 ENCOUNTER — Ambulatory Visit: Payer: BC Managed Care – PPO | Admitting: Occupational Therapy

## 2019-11-07 DIAGNOSIS — M6281 Muscle weakness (generalized): Secondary | ICD-10-CM

## 2019-11-07 DIAGNOSIS — G8929 Other chronic pain: Secondary | ICD-10-CM

## 2019-11-07 DIAGNOSIS — R278 Other lack of coordination: Secondary | ICD-10-CM

## 2019-11-07 DIAGNOSIS — R2681 Unsteadiness on feet: Secondary | ICD-10-CM

## 2019-11-07 DIAGNOSIS — R208 Other disturbances of skin sensation: Secondary | ICD-10-CM

## 2019-11-07 DIAGNOSIS — R41842 Visuospatial deficit: Secondary | ICD-10-CM

## 2019-11-07 DIAGNOSIS — R41840 Attention and concentration deficit: Secondary | ICD-10-CM
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
## 2019-11-08 ENCOUNTER — Encounter: Payer: Self-pay | Admitting: Physical Therapy

## 2019-11-08 ENCOUNTER — Ambulatory Visit: Payer: BC Managed Care – PPO | Admitting: Physical Therapy

## 2019-11-08 ENCOUNTER — Other Ambulatory Visit: Payer: Self-pay

## 2019-11-08 DIAGNOSIS — M542 Cervicalgia: Secondary | ICD-10-CM

## 2019-11-08 DIAGNOSIS — R2689 Other abnormalities of gait and mobility: Secondary | ICD-10-CM

## 2019-11-08 DIAGNOSIS — R2681 Unsteadiness on feet: Secondary | ICD-10-CM

## 2019-11-08 DIAGNOSIS — M6281 Muscle weakness (generalized): Secondary | ICD-10-CM | POA: Diagnosis not present

## 2019-11-08 DIAGNOSIS — R42 Dizziness and giddiness: Secondary | ICD-10-CM
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
## 2019-11-10 ENCOUNTER — Ambulatory Visit: Payer: BC Managed Care – PPO

## 2019-11-10 ENCOUNTER — Other Ambulatory Visit: Payer: Self-pay

## 2019-11-10 DIAGNOSIS — R41841 Cognitive communication deficit: Secondary | ICD-10-CM

## 2019-11-10 DIAGNOSIS — M6281 Muscle weakness (generalized): Secondary | ICD-10-CM | POA: Diagnosis not present
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
## 2019-11-13 ENCOUNTER — Ambulatory Visit: Payer: BC Managed Care – PPO | Admitting: Physical Therapy

## 2019-11-13 ENCOUNTER — Other Ambulatory Visit: Payer: Self-pay

## 2019-11-13 DIAGNOSIS — M6281 Muscle weakness (generalized): Secondary | ICD-10-CM

## 2019-11-13 DIAGNOSIS — R2689 Other abnormalities of gait and mobility: Secondary | ICD-10-CM

## 2019-11-13 DIAGNOSIS — R42 Dizziness and giddiness: Secondary | ICD-10-CM

## 2019-11-13 DIAGNOSIS — M542 Cervicalgia: Secondary | ICD-10-CM

## 2019-11-13 DIAGNOSIS — R2681 Unsteadiness on feet: Secondary | ICD-10-CM

## 2019-11-14 ENCOUNTER — Ambulatory Visit: Payer: BC Managed Care – PPO

## 2019-11-14 ENCOUNTER — Encounter: Payer: Self-pay | Admitting: Physical Therapy

## 2019-11-14 DIAGNOSIS — R41841 Cognitive communication deficit: Secondary | ICD-10-CM

## 2019-11-14 DIAGNOSIS — R4701 Aphasia: Secondary | ICD-10-CM

## 2019-11-14 DIAGNOSIS — M6281 Muscle weakness (generalized): Secondary | ICD-10-CM | POA: Diagnosis not present
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
## 2019-11-15 ENCOUNTER — Ambulatory Visit: Payer: BC Managed Care – PPO | Admitting: Occupational Therapy

## 2019-11-17 ENCOUNTER — Ambulatory Visit: Payer: BC Managed Care – PPO

## 2019-11-17 ENCOUNTER — Ambulatory Visit: Payer: BC Managed Care – PPO | Admitting: Physical Therapy

## 2019-11-17 ENCOUNTER — Other Ambulatory Visit: Payer: Self-pay

## 2019-11-17 ENCOUNTER — Encounter: Payer: Self-pay | Admitting: Physical Therapy

## 2019-11-17 DIAGNOSIS — M6281 Muscle weakness (generalized): Secondary | ICD-10-CM | POA: Diagnosis not present

## 2019-11-17 DIAGNOSIS — R2681 Unsteadiness on feet: Secondary | ICD-10-CM

## 2019-11-17 DIAGNOSIS — M542 Cervicalgia: Secondary | ICD-10-CM

## 2019-11-17 DIAGNOSIS — R42 Dizziness and giddiness: Secondary | ICD-10-CM

## 2019-11-17 DIAGNOSIS — R4701 Aphasia: Secondary | ICD-10-CM

## 2019-11-17 DIAGNOSIS — R41841 Cognitive communication deficit: Secondary | ICD-10-CM

## 2019-11-17 DIAGNOSIS — R2689 Other abnormalities of gait and mobility: Secondary | ICD-10-CM
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
## 2019-11-17 NOTE — Patient Instructions (Signed)
Access Code: [email or web address] URL: [email or web address] Date: 11/17/2019 Prepared by: Bufford Lope  Exercises Supine Hip Flexion - 1 x daily - 7 x weekly - 2 sets - 5 reps Beginner Bridge - 1 x daily - 7 x weekly - 2 sets - 5 reps Small Range Straight Leg Raise - 1 x daily - 7 x weekly - 2 sets - 5 reps
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
## 2019-11-20 ENCOUNTER — Ambulatory Visit: Payer: BC Managed Care – PPO | Admitting: Physical Therapy

## 2019-11-20 ENCOUNTER — Other Ambulatory Visit: Payer: Self-pay

## 2019-11-20 DIAGNOSIS — M6281 Muscle weakness (generalized): Secondary | ICD-10-CM

## 2019-11-20 DIAGNOSIS — R42 Dizziness and giddiness: Secondary | ICD-10-CM

## 2019-11-20 DIAGNOSIS — M542 Cervicalgia: Secondary | ICD-10-CM

## 2019-11-20 DIAGNOSIS — R2681 Unsteadiness on feet: Secondary | ICD-10-CM

## 2019-11-20 DIAGNOSIS — R2689 Other abnormalities of gait and mobility: Secondary | ICD-10-CM

## 2019-11-20 NOTE — Patient Instructions (Signed)
Access Code: [email or web address] URL: [email or web address] Date: 11/17/2019 Prepared by: Bufford Lope  Exercises Small Range Straight Leg Raise - 1 x daily - 7 x weekly - 2 sets - 5 reps Supine Hip Flexion - 1 x daily - 7 x weekly - 2 sets - 5 reps Beginner Bridge - 1 x daily - 7 x weekly - 2 sets - 5 reps Supine Hip Abduction - 1 x daily - 7 x weekly - 2 sets - 5 reps
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
## 2019-11-22 ENCOUNTER — Other Ambulatory Visit: Payer: Self-pay

## 2019-11-22 ENCOUNTER — Encounter: Payer: Self-pay | Admitting: Occupational Therapy

## 2019-11-22 ENCOUNTER — Ambulatory Visit: Payer: BC Managed Care – PPO | Admitting: Occupational Therapy

## 2019-11-22 ENCOUNTER — Encounter: Payer: Self-pay | Admitting: Speech Pathology

## 2019-11-22 ENCOUNTER — Ambulatory Visit: Payer: BC Managed Care – PPO | Admitting: Speech Pathology

## 2019-11-22 DIAGNOSIS — R278 Other lack of coordination: Secondary | ICD-10-CM

## 2019-11-22 DIAGNOSIS — R4701 Aphasia: Secondary | ICD-10-CM

## 2019-11-22 DIAGNOSIS — R2681 Unsteadiness on feet: Secondary | ICD-10-CM

## 2019-11-22 DIAGNOSIS — G8929 Other chronic pain: Secondary | ICD-10-CM

## 2019-11-22 DIAGNOSIS — M6281 Muscle weakness (generalized): Secondary | ICD-10-CM | POA: Diagnosis not present

## 2019-11-22 DIAGNOSIS — R41840 Attention and concentration deficit: Secondary | ICD-10-CM

## 2019-11-22 DIAGNOSIS — R41842 Visuospatial deficit: Secondary | ICD-10-CM

## 2019-11-22 DIAGNOSIS — R41841 Cognitive communication deficit: Secondary | ICD-10-CM
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
## 2019-11-22 NOTE — Patient Instructions (Signed)
   Dr. Naomie Dean     [phone]  neurologist
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
## 2019-11-23 ENCOUNTER — Encounter: Payer: Self-pay | Admitting: Registered Nurse

## 2019-11-23 NOTE — Progress Notes (Signed)
Established Patient Office Visit  Subjective:  Patient ID: Shawn Edwards, male    DOB: 10/28/1980  Age: 39 y.o. MRN: [id number]  CC:  Chief Complaint  Patient presents with  . Follow-up    x4 weeks. Pt stated that he feels the same from last visit. Dizzness, Trouble concertrating, bision, and weakness on left side.    HPI [name] presents for follow up on post-concussive symptoms Still present, but thinks they may be improving with therapy. He continues to follow up with them well. He has no new or urgent concerns, but does note that he continues to struggle with depression. The loss of his mother is weighing heavily on him, and to add on, he has an approaching court date to determine his eligibility to remain in the [place].  We have offered to get him set up with neurology at [facility] - this may be a better post-concussive specific clinic for him than anything we can offer in the [place] area, particularly because he had already been seen at [facility] and did not like his experience there and requested to not go back.  Does not need med refills at this time.  Past Medical History:  Diagnosis Date  . Concussion 02/04/2019  . Known health problems: none     Past Surgical History:  Procedure Laterality Date  . NO PAST SURGERIES      No family history on file.  Social History   Socioeconomic History  . Marital status: Single    Spouse name: Not on file  . Number of children: 0  . Years of education: Not on file  . Highest education level: Associate degree: academic program  Occupational History  . Not on file  Tobacco Use  . Smoking status: Never Smoker  . Smokeless tobacco: Never Used  Substance and Sexual Activity  . Alcohol use: Never  . Drug use: Never  . Sexual activity: Not on file  Other Topics Concern  . Not on file  Social History Narrative   Lives alone   Right handed   Caffeine: sometimes soda   Social Determinants of Health   Financial Resource Strain:    . Difficulty of Paying Living Expenses:   Food Insecurity:   . Worried About Programme researcher, broadcasting/film/video in the Last Year:   . Barista in the Last Year:   Transportation Needs:   . Freight forwarder (Medical):   Marland Kitchen Lack of Transportation (Non-Medical):   Physical Activity:   . Days of Exercise per Week:   . Minutes of Exercise per Session:   Stress:   . Feeling of Stress :   Social Connections:   . Frequency of Communication with Friends and Family:   . Frequency of Social Gatherings with Friends and Family:   . Attends Religious Services:   . Active Member of Clubs or Organizations:   . Attends Banker Meetings:   Marland Kitchen Marital Status:   Intimate Partner Violence:   . Fear of Current or Ex-Partner:   . Emotionally Abused:   Marland Kitchen Physically Abused:   . Sexually Abused:     Outpatient Medications Prior to Visit  Medication Sig Dispense Refill  . ALPRAZolam (XANAX) 0.5 MG tablet Take 1 tablet (0.5 mg total) by mouth at bedtime as needed for anxiety. 60 tablet 0  . amitriptyline (ELAVIL) 50 MG tablet Take 1 tablet (50 mg total) by mouth at bedtime. 90 tablet 0  . cyclobenzaprine (FLEXERIL) 5 MG tablet  Take 2 tablets (10 mg total) by mouth 3 (three) times daily as needed for muscle spasms. 30 tablet 2  . HYDROcodone-acetaminophen (NORCO/VICODIN) 5-325 MG tablet Take 1 tablet by mouth every 6 (six) hours as needed for moderate pain. 30 tablet 0  . ibuprofen (ADVIL) 600 MG tablet Take 1 tablet (600 mg total) by mouth every 6 (six) hours as needed. 30 tablet 2  . ketoconazole (NIZORAL) 2 % shampoo Apply 1 application topically 2 (two) times a week. 120 mL 0  . loperamide (IMODIUM A-D) 2 MG tablet Take 1 tablet (2 mg total) by mouth 4 (four) times daily as needed for diarrhea or loose stools. 30 tablet 0  . LORazepam (ATIVAN) 0.5 MG tablet Take 1 tablet (0.5 mg total) by mouth 2 (two) times daily as needed for anxiety. 30 tablet 1  . sertraline (ZOLOFT) 100 MG tablet Take 1  tablet (100 mg total) by mouth daily. 90 tablet 3  . meclizine (ANTIVERT) 12.5 MG tablet Take 1 tablet (12.5 mg total) by mouth 3 (three) times daily as needed for dizziness. 30 tablet 0  . ondansetron (ZOFRAN) 4 MG tablet Take 1 tablet (4 mg total) by mouth every 8 (eight) hours as needed for nausea or vomiting. 20 tablet 0   No facility-administered medications prior to visit.    Allergies  Allergen Reactions  . Pork-Derived Products     ROS Review of Systems  Constitutional: Negative.   HENT: Negative.   Eyes: Negative.   Respiratory: Negative.   Cardiovascular: Negative.   Gastrointestinal: Negative.   Endocrine: Negative.   Genitourinary: Negative.   Musculoskeletal: Negative.   Skin: Negative.   Allergic/Immunologic: Negative.   Neurological: Positive for dizziness, weakness, light-headedness and headaches.  Hematological: Negative.   Psychiatric/Behavioral: Positive for confusion, decreased concentration, dysphoric mood and sleep disturbance. Negative for agitation, behavioral problems, hallucinations, self-injury and suicidal ideas. The patient is nervous/anxious. The patient is not hyperactive.   All other systems reviewed and are negative.     Objective:    Physical Exam  Constitutional: He is oriented to person, place, and time. He appears well-developed and well-nourished. No distress.  Cardiovascular: Normal rate, regular rhythm, normal heart sounds and intact distal pulses. Exam reveals no gallop and no friction rub.  No murmur heard. Pulmonary/Chest: Effort normal and breath sounds normal. No respiratory distress. He has no wheezes. He has no rales. He exhibits no tenderness.  Neurological: He is alert and oriented to person, place, and time.  Skin: Skin is warm and dry. No rash noted. He is not diaphoretic. No erythema. No pallor.  Psychiatric: He has a normal mood and affect. His behavior is normal. Judgment and thought content normal.  Nursing note and  vitals reviewed.   BP 129/88 (BP Location: Right Arm, Patient Position: Sitting, Cuff Size: Normal)   Pulse 85   Temp 98.2 F (36.8 C) (Temporal)   SpO2 97%  Wt Readings from Last 3 Encounters:  01/13/19 152 lb 1.9 oz (69 kg)  01/07/19 149 lb 14.6 oz (68 kg)     Health Maintenance Due  Topic Date Due  . HIV Screening  Never done    There are no preventive care reminders to display for this patient.  Lab Results  Component Value Date   TSH 2.190 09/18/2019   Lab Results  Component Value Date   WBC 5.3 09/18/2019   HGB 15.8 09/18/2019   HCT 44.1 09/18/2019   MCV 90 09/18/2019   PLT 216 09/18/2019  Lab Results  Component Value Date   NA 140 09/18/2019   K 3.8 09/18/2019   CO2 24 09/18/2019   GLUCOSE 97 09/18/2019   BUN 14 09/18/2019   CREATININE 0.79 09/18/2019   BILITOT 0.5 09/18/2019   ALKPHOS 53 09/18/2019   AST 21 09/18/2019   ALT 30 09/18/2019   PROT 7.1 09/18/2019   ALBUMIN 4.6 09/18/2019   CALCIUM 9.5 09/18/2019   ANIONGAP 11 02/05/2019   Lab Results  Component Value Date   CHOL 209 (H) 09/18/2019   Lab Results  Component Value Date   HDL 68 09/18/2019   Lab Results  Component Value Date   LDLCALC 117 (H) 09/18/2019   Lab Results  Component Value Date   TRIG 136 09/18/2019   Lab Results  Component Value Date   CHOLHDL 3.1 09/18/2019   Lab Results  Component Value Date   HGBA1C 5.3 09/18/2019      Assessment & Plan:   Problem List Items Addressed This Visit    None    Visit Diagnoses    Onychomycosis    -  Primary   Relevant Medications   terbinafine (LAMISIL) 250 MG tablet   Non-intractable vomiting with nausea, unspecified vomiting type       Relevant Medications   ondansetron (ZOFRAN) 4 MG tablet   Dizziness       Relevant Medications   meclizine (ANTIVERT) 12.5 MG tablet   Chronic post-traumatic headache, not intractable       Relevant Medications   butalbital-acetaminophen-caffeine (FIORICET) 50-325-40 MG tablet    Dental infection       Relevant Medications   terbinafine (LAMISIL) 250 MG tablet   amoxicillin-clavulanate (AUGMENTIN) 875-125 MG tablet      Meds ordered this encounter  Medications  . ondansetron (ZOFRAN) 4 MG tablet    Sig: Take 1 tablet (4 mg total) by mouth every 8 (eight) hours as needed for nausea or vomiting.    Dispense:  20 tablet    Refill:  0    Order Specific Question:   Supervising Provider    Answer:   Collie Siad A [id number]  . meclizine (ANTIVERT) 12.5 MG tablet    Sig: Take 1 tablet (12.5 mg total) by mouth 3 (three) times daily as needed for dizziness.    Dispense:  30 tablet    Refill:  0    Order Specific Question:   Supervising Provider    Answer:   Collie Siad A [id number]  . butalbital-acetaminophen-caffeine (FIORICET) 50-325-40 MG tablet    Sig: Take 1-2 tablets by mouth every 6 (six) hours as needed for headache.    Dispense:  20 tablet    Refill:  0    Order Specific Question:   Supervising Provider    Answer:   Collie Siad A [id number]  . terbinafine (LAMISIL) 250 MG tablet    Sig: Take 1 tablet (250 mg total) by mouth daily.    Dispense:  84 tablet    Refill:  0    Order Specific Question:   Supervising Provider    Answer:   Collie Siad A [id number]  . amoxicillin-clavulanate (AUGMENTIN) 875-125 MG tablet    Sig: Take 1 tablet by mouth 2 (two) times daily.    Dispense:  20 tablet    Refill:  0    Order Specific Question:   Supervising Provider    Answer:   Doristine Bosworth [id number]    Follow-up: No follow-ups on file.   Plan  Medications as prescribed  Filled out new pt paperwork for [facility]  Continue with PT  Return in 4-8 weeks for follow up  Patient encouraged to call clinic with any questions, comments, or concerns.  Maximiano Coss, NP

## 2019-11-24 ENCOUNTER — Encounter: Payer: Self-pay | Admitting: Physical Therapy

## 2019-11-24 ENCOUNTER — Other Ambulatory Visit: Payer: Self-pay

## 2019-11-24 ENCOUNTER — Ambulatory Visit: Payer: BC Managed Care – PPO | Admitting: Speech Pathology

## 2019-11-24 ENCOUNTER — Encounter: Payer: Self-pay | Admitting: Speech Pathology

## 2019-11-24 ENCOUNTER — Ambulatory Visit: Payer: BC Managed Care – PPO | Admitting: Physical Therapy

## 2019-11-24 DIAGNOSIS — R42 Dizziness and giddiness: Secondary | ICD-10-CM

## 2019-11-24 DIAGNOSIS — M542 Cervicalgia: Secondary | ICD-10-CM

## 2019-11-24 DIAGNOSIS — R2689 Other abnormalities of gait and mobility: Secondary | ICD-10-CM

## 2019-11-24 DIAGNOSIS — M6281 Muscle weakness (generalized): Secondary | ICD-10-CM | POA: Diagnosis not present

## 2019-11-24 DIAGNOSIS — R41841 Cognitive communication deficit: Secondary | ICD-10-CM

## 2019-11-24 DIAGNOSIS — R4701 Aphasia: Secondary | ICD-10-CM

## 2019-11-24 DIAGNOSIS — R2681 Unsteadiness on feet: Secondary | ICD-10-CM
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
## 2019-11-27 ENCOUNTER — Ambulatory Visit: Payer: BC Managed Care – PPO | Attending: Registered Nurse | Admitting: Physical Therapy

## 2019-11-27 ENCOUNTER — Ambulatory Visit: Payer: BC Managed Care – PPO

## 2019-11-27 ENCOUNTER — Other Ambulatory Visit: Payer: Self-pay

## 2019-11-27 DIAGNOSIS — M542 Cervicalgia: Secondary | ICD-10-CM | POA: Insufficient documentation

## 2019-11-27 DIAGNOSIS — G8929 Other chronic pain: Secondary | ICD-10-CM | POA: Insufficient documentation

## 2019-11-27 DIAGNOSIS — R42 Dizziness and giddiness: Secondary | ICD-10-CM | POA: Diagnosis present

## 2019-11-27 DIAGNOSIS — R209 Unspecified disturbances of skin sensation: Secondary | ICD-10-CM | POA: Insufficient documentation

## 2019-11-27 DIAGNOSIS — M6281 Muscle weakness (generalized): Secondary | ICD-10-CM | POA: Diagnosis present

## 2019-11-27 DIAGNOSIS — R2689 Other abnormalities of gait and mobility: Secondary | ICD-10-CM | POA: Insufficient documentation

## 2019-11-27 DIAGNOSIS — R2681 Unsteadiness on feet: Secondary | ICD-10-CM | POA: Diagnosis not present

## 2019-11-27 DIAGNOSIS — R41841 Cognitive communication deficit: Secondary | ICD-10-CM | POA: Diagnosis present

## 2019-11-27 DIAGNOSIS — R41842 Visuospatial deficit: Secondary | ICD-10-CM | POA: Insufficient documentation

## 2019-11-27 DIAGNOSIS — R278 Other lack of coordination: Secondary | ICD-10-CM | POA: Insufficient documentation

## 2019-11-27 DIAGNOSIS — M25512 Pain in left shoulder: Secondary | ICD-10-CM | POA: Insufficient documentation

## 2019-11-27 DIAGNOSIS — R4701 Aphasia: Secondary | ICD-10-CM | POA: Diagnosis present

## 2019-11-27 DIAGNOSIS — R41840 Attention and concentration deficit: Secondary | ICD-10-CM | POA: Insufficient documentation
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
## 2019-11-27 NOTE — Therapy (Signed)
[facility] [address] [place], [place], [address] Phone: [phone]   Fax:  [phone]  Patient Details  Name: Shawn Edwards MRN: [id number] Date of Birth: 21-May-1981 Referring Provider:  Janeece Agee, NP  Encounter Date: 11/27/2019     Speech Therapy (ST) Arrived/Cancel  Pt arrived for PT and speech therapy (ST) today but after PT,  he stated his headache pain was too strong/severe to participate in ST today.   If pt 1) body pain, 2) headache pain, 3) vertigo, 4) light sensitivity, and 5) mood do not show some sort of improvement by next week, SLP suggests putting ST on hold, or discharging pt until these these areas can be better managed in order to give pt best possible situation for participation in ST.  Currently, pt is (understandably) too distracted by these other issues to effectively participate in ST tasks.   [facility] ,MS, CCC-SLP  11/27/2019, 1:28 PM  [facility] [address] [place], [place], [address] Phone: [phone]   Fax:  [phone]

## 2019-11-28 ENCOUNTER — Encounter: Payer: Self-pay | Admitting: Registered Nurse

## 2019-11-28 ENCOUNTER — Ambulatory Visit (INDEPENDENT_AMBULATORY_CARE_PROVIDER_SITE_OTHER): Payer: BC Managed Care – PPO | Admitting: Registered Nurse

## 2019-11-28 ENCOUNTER — Other Ambulatory Visit: Payer: Self-pay

## 2019-11-28 VITALS — BP 117/80 | HR 72 | Temp 98.0°F | Resp 16 | Ht 67.0 in | Wt 152.0 lb

## 2019-11-28 DIAGNOSIS — R112 Nausea with vomiting, unspecified: Secondary | ICD-10-CM

## 2019-11-28 DIAGNOSIS — G44329 Chronic post-traumatic headache, not intractable: Secondary | ICD-10-CM

## 2019-11-28 DIAGNOSIS — F329 Major depressive disorder, single episode, unspecified: Secondary | ICD-10-CM | POA: Diagnosis not present

## 2019-11-28 DIAGNOSIS — F323 Major depressive disorder, single episode, severe with psychotic features: Secondary | ICD-10-CM

## 2019-11-28 DIAGNOSIS — M62838 Other muscle spasm: Secondary | ICD-10-CM

## 2019-11-28 DIAGNOSIS — R42 Dizziness and giddiness: Secondary | ICD-10-CM

## 2019-11-28 DIAGNOSIS — F32A Depression, unspecified: Secondary | ICD-10-CM

## 2019-11-28 DIAGNOSIS — F0781 Postconcussional syndrome: Secondary | ICD-10-CM

## 2019-11-28 DIAGNOSIS — G479 Sleep disorder, unspecified: Secondary | ICD-10-CM

## 2019-11-28 DIAGNOSIS — L210 Seborrhea capitis: Secondary | ICD-10-CM

## 2019-11-28 MED ORDER — CYCLOBENZAPRINE HCL 5 MG PO TABS: 10.0000 mg | ORAL_TABLET | Freq: Three times a day (TID) | ORAL | 2 refills | Status: DC | PRN

## 2019-11-28 MED ORDER — MECLIZINE HCL 25 MG PO CHEW: 25.0000 mg | CHEWABLE_TABLET | Freq: Three times a day (TID) | ORAL | 3 refills | Status: DC

## 2019-11-28 MED ORDER — BUTALBITAL-APAP-CAFFEINE 50-325-40 MG PO TABS: 1.0000 | ORAL_TABLET | Freq: Four times a day (QID) | ORAL | 0 refills | Status: DC | PRN

## 2019-11-28 MED ORDER — ALPRAZOLAM 0.5 MG PO TABS: 0.5000 mg | ORAL_TABLET | Freq: Every evening | ORAL | 0 refills | Status: DC | PRN

## 2019-11-28 MED ORDER — SERTRALINE HCL 100 MG PO TABS: 100.0000 mg | ORAL_TABLET | Freq: Every day | ORAL | 3 refills | Status: DC

## 2019-11-28 MED ORDER — ONDANSETRON HCL 4 MG PO TABS: 4.0000 mg | ORAL_TABLET | Freq: Three times a day (TID) | ORAL | 0 refills | Status: DC | PRN

## 2019-11-28 MED ORDER — AMITRIPTYLINE HCL 50 MG PO TABS: 50.0000 mg | ORAL_TABLET | Freq: Every day | ORAL | 0 refills | Status: DC

## 2019-11-28 MED ORDER — KETOCONAZOLE 2 % EX SHAM: 1.0000 "application " | MEDICATED_SHAMPOO | CUTANEOUS | 0 refills | Status: DC

## 2019-11-28 MED ORDER — SCOPOLAMINE 1 MG/3DAYS TD PT72: 1.0000 | MEDICATED_PATCH | TRANSDERMAL | 12 refills | Status: DC

## 2019-11-28 MED ORDER — BUPROPION HCL ER (XL) 300 MG PO TB24: 300.0000 mg | ORAL_TABLET | Freq: Every day | ORAL | 1 refills | Status: DC

## 2019-11-28 MED ORDER — IBUPROFEN 600 MG PO TABS: 600.0000 mg | ORAL_TABLET | Freq: Four times a day (QID) | ORAL | 2 refills | Status: DC | PRN

## 2019-11-29 ENCOUNTER — Encounter: Payer: Self-pay | Admitting: Family Medicine

## 2019-11-29 ENCOUNTER — Ambulatory Visit (INDEPENDENT_AMBULATORY_CARE_PROVIDER_SITE_OTHER): Payer: BC Managed Care – PPO | Admitting: Family Medicine

## 2019-11-29 ENCOUNTER — Encounter: Payer: Self-pay | Admitting: Physical Therapy

## 2019-11-29 ENCOUNTER — Ambulatory Visit: Payer: BC Managed Care – PPO | Admitting: Physical Therapy

## 2019-11-29 ENCOUNTER — Ambulatory Visit: Payer: BC Managed Care – PPO | Admitting: Speech Pathology

## 2019-11-29 VITALS — BP 142/78 | HR 76 | Temp 97.5°F | Ht 67.0 in

## 2019-11-29 DIAGNOSIS — R41841 Cognitive communication deficit: Secondary | ICD-10-CM

## 2019-11-29 DIAGNOSIS — R2681 Unsteadiness on feet: Secondary | ICD-10-CM

## 2019-11-29 DIAGNOSIS — R4701 Aphasia: Secondary | ICD-10-CM

## 2019-11-29 DIAGNOSIS — R42 Dizziness and giddiness: Secondary | ICD-10-CM | POA: Diagnosis not present

## 2019-11-29 DIAGNOSIS — F0781 Postconcussional syndrome: Secondary | ICD-10-CM | POA: Diagnosis not present

## 2019-11-29 DIAGNOSIS — R519 Headache, unspecified: Secondary | ICD-10-CM | POA: Diagnosis not present

## 2019-11-29 DIAGNOSIS — M542 Cervicalgia: Secondary | ICD-10-CM

## 2019-11-29 DIAGNOSIS — G8929 Other chronic pain: Secondary | ICD-10-CM

## 2019-11-29 DIAGNOSIS — R112 Nausea with vomiting, unspecified: Secondary | ICD-10-CM | POA: Diagnosis not present

## 2019-11-29 DIAGNOSIS — M6281 Muscle weakness (generalized): Secondary | ICD-10-CM

## 2019-11-29 DIAGNOSIS — R2689 Other abnormalities of gait and mobility: Secondary | ICD-10-CM
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
## 2019-11-29 NOTE — Telephone Encounter (Signed)
Patient has been seen at the Concussion clinic but we are not the referring doctor so we will have to request records through medical records.

## 2019-11-29 NOTE — Progress Notes (Addendum)
PATIENT: Shawn Edwards DOB: 1981/07/20  REASON FOR VISIT: follow up HISTORY FROM: patient  Chief Complaint  Patient presents with  . Follow-up    Rm 5, Interpreter in rm. headaches (could not participate in therapy due to head pain), , c/o dizziness, headache, memory loss, photo/phono sensitivity, off balance     HISTORY OF PRESENT ILLNESS: Today 12/05/19 Shawn Edwards is a 39 y.o. male here today for follow up. He reports continuation of multiple symptoms. He continues to have severe, persistant headaches, significant light sensitivity, regular vomiting, extreme dizziness, significant depression, double vision (not seen by ophthalmology). He was referred to neurophthalmology but states he does not have transportation. He was referred to [facility] in 01/2019 for second opinion related to concerns of post concussive syndrome. At my last visit with him, he had not been seen. I personally assisted with calling [facility] with his interpreter to get an appointment scheduled. He no showed/canceled three appointments. On 10/03/2019, he was seen by Dr Hope Pigeon, [facility] neurology. Exam reported to be largely functional. She reviewed imaging and felt it was inconsistent with his persistent and reportedly disabling symptoms. Elavil was increased to 75mg  daily. He has not increased dose as recommended. He reported to PCP that he was unhappy with his visit and was referred to [facility] neurology. Appt scheduled for 12/05/2019. He was also referred to neuropsychology for cognition concerns. Appt scheduled for 12/28/2019. He has continued PT/OT and ST. Participation in therapy seems   Primary care has continued management of multiple symptoms with Wellbutrin 300mg , Elavil 50mg , Flexeril 5mg , Zoloft 100mg , Xanax 0.5mg , Meclizine, Zofran, scopolamine and Fioricet. It is unclear if he is taking these medications daily as prescribed. He does not feel that medications are helping. He admits to worsening depression.  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 I am unable to see if he has an upcoming appointment.     REVIEW OF SYSTEMS: Out of a complete 14 system review of symptoms, the patient complains only of the following symptoms, headaches, dizziness, vomiting, pain, imbalance, tremor, and all other reviewed systems are negative.  ALLERGIES: Allergies  Allergen Reactions  . Pork-Derived Products     HOME MEDICATIONS: Outpatient Medications Prior to Visit  Medication Sig Dispense Refill  . ALPRAZolam (XANAX) 0.5 MG tablet Take 1 tablet (0.5 mg total) by mouth at bedtime as needed for anxiety. 60 tablet 0  . amitriptyline (ELAVIL) 50 MG tablet Take 1 tablet (50 mg total) by mouth at bedtime. 90 tablet 0  . buPROPion (WELLBUTRIN XL) 300 MG 24 hr tablet Take 1 tablet (300 mg total) by mouth daily. 90 tablet 1  . butalbital-acetaminophen-caffeine (FIORICET) 50-325-40 MG tablet Take 1-2 tablets by mouth every 6 (six) hours as needed for headache. 20 tablet 0  . cyclobenzaprine (FLEXERIL) 5 MG tablet Take 2 tablets (10 mg total) by mouth 3 (three) times daily as needed for muscle spasms. 30 tablet 2  . ibuprofen (ADVIL) 600 MG tablet Take 1 tablet (600 mg total) by mouth every 6 (six) hours as needed. 30 tablet 2  . ketoconazole (NIZORAL) 2 % shampoo Apply 1 application topically 2 (two) times a week. 120 mL 0  . Meclizine HCl 25 MG CHEW Chew 1 tablet (25 mg total) by mouth in the morning, at noon, and at bedtime. 90 tablet 3  . ondansetron (ZOFRAN) 4 MG tablet Take 1 tablet (4 mg total) by mouth every 8 (eight) hours as needed for nausea or vomiting.  20 tablet 0  . scopolamine (TRANSDERM-SCOP, 1.5 MG,) 1 MG/3DAYS Place 1 patch (1.5 mg total) onto the skin every 3 (three) days. 10 patch 12  . sertraline (ZOLOFT) 100 MG tablet Take 1 tablet (100 mg total) by mouth daily. 90 tablet 3  . terbinafine (LAMISIL) 250 MG tablet Take 1 tablet (250 mg total) by mouth daily.  84 tablet 0   No facility-administered medications prior to visit.    PAST MEDICAL HISTORY: Past Medical History:  Diagnosis Date  . Concussion 02/04/2019  . Known health problems: none     PAST SURGICAL HISTORY: Past Surgical History:  Procedure Laterality Date  . NO PAST SURGERIES      FAMILY HISTORY: History reviewed. No pertinent family history.  SOCIAL HISTORY: Social History   Socioeconomic History  . Marital status: Single    Spouse name: Not on file  . Number of children: 0  . Years of education: Not on file  . Highest education level: Associate degree: academic program  Occupational History  . Not on file  Tobacco Use  . Smoking status: Never Smoker  . Smokeless tobacco: Never Used  Substance and Sexual Activity  . Alcohol use: Never  . Drug use: Never  . Sexual activity: Not on file  Other Topics Concern  . Not on file  Social History Narrative   Lives alone   Right handed   Caffeine: sometimes soda   Social Determinants of Health   Financial Resource Strain:   . Difficulty of Paying Living Expenses:   Food Insecurity:   . Worried About Programme researcher, broadcasting/film/video in the Last Year:   . Barista in the Last Year:   Transportation Needs:   . Freight forwarder (Medical):   Marland Kitchen Lack of Transportation (Non-Medical):   Physical Activity:   . Days of Exercise per Week:   . Minutes of Exercise per Session:   Stress:   . Feeling of Stress :   Social Connections:   . Frequency of Communication with Friends and Family:   . Frequency of Social Gatherings with Friends and Family:   . Attends Religious Services:   . Active Member of Clubs or Organizations:   . Attends Banker Meetings:   Marland Kitchen Marital Status:   Intimate Partner Violence:   . Fear of Current or Ex-Partner:   . Emotionally Abused:   Marland Kitchen Physically Abused:   . Sexually Abused:       PHYSICAL EXAM  Vitals:   11/29/19 1442  BP: (!) 142/78  Pulse: 76  Temp: (!) 97.5 F  (36.4 C)  Height: 5\' 7"  (1.702 m)   Body mass index is 23.81 kg/m.  Generalized: Well developed, in no acute distress  Cardiology: normal rate and rhythm, no murmur noted Respiratory: clear to auscultation bilaterally  Neurological examination  Mentation: Alert oriented to time, place, history taking. Follows all commands speech and language fluent Cranial nerve II-XII: Left pupil reactive to light, right eye covered with safety goggle due to reported pain. Extraocular movements were full, visual field were full on confrontational test. Facial sensation and strength were normal. Uvula tongue midline. Head turning and shoulder shrug  were normal and symmetric. Motor: Unable to assess motor strength as patient continues to have breakaway weakness. Good symmetric motor tone is noted throughout.  Intermittent and distractible tremor noted of the right hand. Sensory: Sensory testing is intact to soft touch on all 4 extremities. No evidence of extinction is noted.  Coordination: Cerebellar testing reveals slow finger-nose-finger and heel-to-shin bilaterally.  Gait and station: patient reports being unable to walk. He presents in a wheelchair today. He reports arriving to PT via [place] transportation and walking into rehab.  Reflexes: Deep tendon reflexes are symmetric and normal bilaterally.   DIAGNOSTIC DATA (LABS, IMAGING, TESTING) - I reviewed patient records, labs, notes, testing and imaging myself where available.  No flowsheet data found.   Lab Results  Component Value Date   WBC 5.3 09/18/2019   HGB 15.8 09/18/2019   HCT 44.1 09/18/2019   MCV 90 09/18/2019   PLT 216 09/18/2019      Component Value Date/Time   NA 140 09/18/2019 1243   K 3.8 09/18/2019 1243   CL 102 09/18/2019 1243   CO2 24 09/18/2019 1243   GLUCOSE 97 09/18/2019 1243   GLUCOSE 92 02/05/2019 1320   BUN 14 09/18/2019 1243   CREATININE 0.79 09/18/2019 1243   CALCIUM 9.5 09/18/2019 1243   PROT 7.1 09/18/2019 1243    ALBUMIN 4.6 09/18/2019 1243   AST 21 09/18/2019 1243   ALT 30 09/18/2019 1243   ALKPHOS 53 09/18/2019 1243   BILITOT 0.5 09/18/2019 1243   GFRNONAA 112 09/18/2019 1243   GFRAA 130 09/18/2019 1243   Lab Results  Component Value Date   CHOL 209 (H) 09/18/2019   HDL 68 09/18/2019   LDLCALC 117 (H) 09/18/2019   TRIG 136 09/18/2019   CHOLHDL 3.1 09/18/2019   Lab Results  Component Value Date   HGBA1C 5.3 09/18/2019   No results found for: [id number] Lab Results  Component Value Date   TSH 2.190 09/18/2019       ASSESSMENT AND PLAN 39 y.o. year old male  has a past medical history of Concussion (02/04/2019) and Known health problems: none. here with     ICD-10-CM   1. Post concussion syndrome  F07.81   2. Dizziness  R42   3. Chronic intractable headache, unspecified headache type  R51.9    G89.29   4. Nausea and vomiting, intractability of vomiting not specified, unspecified vomiting type  R11.2     [place] presents today with continued concerns of intractable headaches, persistent and debilitating dizziness, severe pain, right tremor, persistent nausea and vomiting, imbalance and anxiety/depression.  I have attempted multiple times to get him seen with [facility], local neurologist for consult and evaluation for possible postconcussive syndrome.  He has either no showed or canceled 3 appointments.  With continued evaluation in the office and review of notes from [facility] neurology, I am concerned that symptoms may be much more closely linked to mental health disorder versus neurological condition.  He was recently referred to psychiatry but I do not see that a specific appointment has been scheduled.  I have encouraged him to continue close follow-up with his primary care and seek emergency medical attention for any worsening of depression.  We have discussed previous recommendation of increasing Elavil as he seems to be tolerating this fairly well.  Patient is repetitively  asking for a second opinion and is not interested in increasing current medications at this time.  He has an appointment scheduled with [facility] neurology on 5/11.  He also an appointment for consult and evaluation with neuropsychology in June.  I have recommended that he keep these appointments for continued evaluation of his concerns.  He was encouraged to stay well-hydrated and focus on healthy lifestyle habits.  He will continue physical, occupational and speech therapy as directed.  I have offered to send him to neuro-ophthalmology, however, he declines as he is not willing to travel to be seen.  I do not know of any local neuro-ophthalmologist but will reach out to my colleagues with any suggestions.  After discussion with Dr. Daisy Blossom, we do not feel that we have anything further to offer this patient.  Is persistent and debilitating symptoms do not align with previous and current work-up including imaging and neuro examinations.  Neuro exam today is largely functional.  BMI has stayed stable over the past year.  I am hopeful that with continued to push for psychiatry evaluation, he may be better served and symptoms may be better managed.  He verbalizes understanding and agreement.   No orders of the defined types were placed in this encounter.    No orders of the defined types were placed in this encounter.     I spent 60 minutes with the patient. 50% of this time was spent counseling and educating patient on plan of care and medications.    Shawnie Dapper, FNP-C 12/05/2019, 4:42 PM [facility] [address], [address] [place], [place] [address] [phone]  Made any corrections needed, and agree with history, physical, neuro exam,assessment and plan as stated.     Naomie Dean, MD [facility]   Made any corrections needed, and agree with history, physical, neuro exam,assessment and plan as stated.     Naomie Dean, MD [facility]

## 2019-11-29 NOTE — Patient Instructions (Addendum)
I recommend continuing with plan already set in place. You have an appt with neurology at [facility] on 12/05/2019 and an appt with neuropsychology on 12/28/2019. Please keep these appts.

## 2019-11-30 ENCOUNTER — Encounter: Payer: Self-pay | Admitting: Speech Pathology
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
## 2019-11-30 NOTE — Telephone Encounter (Signed)
No action required.

## 2019-12-04 ENCOUNTER — Encounter: Payer: Self-pay | Admitting: Speech Pathology

## 2019-12-04 ENCOUNTER — Other Ambulatory Visit: Payer: Self-pay

## 2019-12-04 ENCOUNTER — Ambulatory Visit: Payer: BC Managed Care – PPO | Admitting: Physical Therapy

## 2019-12-04 ENCOUNTER — Ambulatory Visit: Payer: BC Managed Care – PPO | Admitting: Speech Pathology

## 2019-12-04 ENCOUNTER — Encounter: Payer: Self-pay | Admitting: Registered Nurse

## 2019-12-04 DIAGNOSIS — M6281 Muscle weakness (generalized): Secondary | ICD-10-CM

## 2019-12-04 DIAGNOSIS — R2681 Unsteadiness on feet: Secondary | ICD-10-CM

## 2019-12-04 DIAGNOSIS — R42 Dizziness and giddiness: Secondary | ICD-10-CM

## 2019-12-04 DIAGNOSIS — R41841 Cognitive communication deficit: Secondary | ICD-10-CM

## 2019-12-04 DIAGNOSIS — R2689 Other abnormalities of gait and mobility: Secondary | ICD-10-CM

## 2019-12-04 DIAGNOSIS — M542 Cervicalgia: Secondary | ICD-10-CM
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
## 2019-12-04 NOTE — Progress Notes (Signed)
Established Patient Office Visit  Subjective:  Patient ID: Shawn Edwards, male    DOB: Jan 14, 1981  Age: 39 y.o. MRN: [id number]  CC:  Chief Complaint  Patient presents with   Follow-up    follow up on medical conditions symptoms are about the same with headaches and body pain    HPI Shawn Edwards presents for follow up on post-concussive symptoms  Shawn Edwards expresses more optimism today. Therapy is going well and he likes his team. He is looking forward to the neuro-psych consult coming up in June. He has been able to work with the courts to delay his dates.  He is still experiencing some vomiting during therapy that does not seem to be relieved with the meclizine 12.5mg  PO tid PRN. His therapist, Bufford Lope, has suggested scopalamine patches or ondansetron - we will provide these to Shawn Edwards, as well as an increased dose of meclizine.   Otherwise, he has some hesitation regarding traveling to [facility] for his neurologist appt at [facility]. We again discussed that given the limited resources at [facility] and his reluctance to return to [facility], this will be the best option for the severity and duration of his symptoms. We are optimistic that the scopalamine patches, ondansetron, and meclizine will provide more benefit for the nausea he gets when in vehicles. We have already assisted him in completing his new pt paperwork for this appt.  Otherwise, he is hoping to get an update medication list and clear instructions on medications. This will be provided to him.    Past Medical History:  Diagnosis Date   Concussion 02/04/2019   Known health problems: none     Past Surgical History:  Procedure Laterality Date   NO PAST SURGERIES      No family history on file.  Social History   Socioeconomic History   Marital status: Single    Spouse name: Not on file   Number of children: 0   Years of education: Not on file   Highest education level: Associate degree: academic program    Occupational History   Not on file  Tobacco Use   Smoking status: Never Smoker   Smokeless tobacco: Never Used  Substance and Sexual Activity   Alcohol use: Never   Drug use: Never   Sexual activity: Not on file  Other Topics Concern   Not on file  Social History Narrative   Lives alone   Right handed   Caffeine: sometimes soda   Social Determinants of Health   Financial Resource Strain:    Difficulty of Paying Living Expenses:   Food Insecurity:    Worried About Programme researcher, broadcasting/film/video in the Last Year:    Barista in the Last Year:   Transportation Needs:    Freight forwarder (Medical):    Lack of Transportation (Non-Medical):   Physical Activity:    Days of Exercise per Week:    Minutes of Exercise per Session:   Stress:    Feeling of Stress :   Social Connections:    Frequency of Communication with Friends and Family:    Frequency of Social Gatherings with Friends and Family:    Attends Religious Services:    Active Member of Clubs or Organizations:    Attends Banker Meetings:    Marital Status:   Intimate Partner Violence:    Fear of Current or Ex-Partner:    Emotionally Abused:    Physically Abused:    Sexually Abused:  Outpatient Medications Prior to Visit  Medication Sig Dispense Refill   terbinafine (LAMISIL) 250 MG tablet Take 1 tablet (250 mg total) by mouth daily. 84 tablet 0   ALPRAZolam (XANAX) 0.5 MG tablet Take 1 tablet (0.5 mg total) by mouth at bedtime as needed for anxiety. 60 tablet 0   amitriptyline (ELAVIL) 50 MG tablet Take 1 tablet (50 mg total) by mouth at bedtime. 90 tablet 0   amoxicillin-clavulanate (AUGMENTIN) 875-125 MG tablet Take 1 tablet by mouth 2 (two) times daily. 20 tablet 0   butalbital-acetaminophen-caffeine (FIORICET) 50-325-40 MG tablet Take 1-2 tablets by mouth every 6 (six) hours as needed for headache. 20 tablet 0   cyclobenzaprine (FLEXERIL) 5 MG tablet Take 2  tablets (10 mg total) by mouth 3 (three) times daily as needed for muscle spasms. 30 tablet 2   HYDROcodone-acetaminophen (NORCO/VICODIN) 5-325 MG tablet Take 1 tablet by mouth every 6 (six) hours as needed for moderate pain. 30 tablet 0   ibuprofen (ADVIL) 600 MG tablet Take 1 tablet (600 mg total) by mouth every 6 (six) hours as needed. 30 tablet 2   ketoconazole (NIZORAL) 2 % shampoo Apply 1 application topically 2 (two) times a week. 120 mL 0   loperamide (IMODIUM A-D) 2 MG tablet Take 1 tablet (2 mg total) by mouth 4 (four) times daily as needed for diarrhea or loose stools. 30 tablet 0   LORazepam (ATIVAN) 0.5 MG tablet Take 1 tablet (0.5 mg total) by mouth 2 (two) times daily as needed for anxiety. 30 tablet 1   meclizine (ANTIVERT) 12.5 MG tablet Take 1 tablet (12.5 mg total) by mouth 3 (three) times daily as needed for dizziness. 30 tablet 0   ondansetron (ZOFRAN) 4 MG tablet Take 1 tablet (4 mg total) by mouth every 8 (eight) hours as needed for nausea or vomiting. 20 tablet 0   sertraline (ZOLOFT) 100 MG tablet Take 1 tablet (100 mg total) by mouth daily. 90 tablet 3   No facility-administered medications prior to visit.    Allergies  Allergen Reactions   Pork-Derived Products     ROS Review of Systems  Constitutional: Positive for fatigue. Negative for activity change, appetite change, chills, diaphoresis, fever and unexpected weight change.  HENT: Negative.   Eyes: Negative.   Respiratory: Negative.   Cardiovascular: Negative.   Gastrointestinal: Negative.   Endocrine: Negative.   Genitourinary: Negative.   Musculoskeletal: Negative.   Skin: Negative.   Allergic/Immunologic: Negative.   Neurological: Positive for dizziness, weakness and headaches. Negative for tremors, seizures, syncope, facial asymmetry, speech difficulty, light-headedness and numbness.  Hematological: Negative.   Psychiatric/Behavioral: Positive for dysphoric mood and sleep disturbance.  Negative for agitation, behavioral problems, confusion, decreased concentration, hallucinations, self-injury and suicidal ideas. The patient is nervous/anxious. The patient is not hyperactive.   All other systems reviewed and are negative.     Objective:    Physical Exam  Constitutional: He is oriented to person, place, and time. He appears well-developed and well-nourished. No distress.  Cardiovascular: Normal rate and regular rhythm.  Pulmonary/Chest: Effort normal. No respiratory distress.  Neurological: He is alert and oriented to person, place, and time. No cranial nerve deficit. He exhibits abnormal muscle tone.  Skin: Skin is warm and dry. No rash noted. He is not diaphoretic. No erythema. No pallor.  Psychiatric: He has a normal mood and affect. His behavior is normal. Judgment and thought content normal.  Nursing note and vitals reviewed.   BP 117/80  Pulse 72    Temp 98 F (36.7 C) (Temporal)    Resp 16    Ht 5\' 7"  (1.702 m)    Wt 152 lb (68.9 kg)    SpO2 98%    BMI 23.81 kg/m  Wt Readings from Last 3 Encounters:  11/28/19 152 lb (68.9 kg)  01/13/19 152 lb 1.9 oz (69 kg)  01/07/19 149 lb 14.6 oz (68 kg)     Health Maintenance Due  Topic Date Due   HIV Screening  Never done    There are no preventive care reminders to display for this patient.  Lab Results  Component Value Date   TSH 2.190 09/18/2019   Lab Results  Component Value Date   WBC 5.3 09/18/2019   HGB 15.8 09/18/2019   HCT 44.1 09/18/2019   MCV 90 09/18/2019   PLT 216 09/18/2019   Lab Results  Component Value Date   NA 140 09/18/2019   K 3.8 09/18/2019   CO2 24 09/18/2019   GLUCOSE 97 09/18/2019   BUN 14 09/18/2019   CREATININE 0.79 09/18/2019   BILITOT 0.5 09/18/2019   ALKPHOS 53 09/18/2019   AST 21 09/18/2019   ALT 30 09/18/2019   PROT 7.1 09/18/2019   ALBUMIN 4.6 09/18/2019   CALCIUM 9.5 09/18/2019   ANIONGAP 11 02/05/2019   Lab Results  Component Value Date   CHOL 209 (H)  09/18/2019   Lab Results  Component Value Date   HDL 68 09/18/2019   Lab Results  Component Value Date   LDLCALC 117 (H) 09/18/2019   Lab Results  Component Value Date   TRIG 136 09/18/2019   Lab Results  Component Value Date   CHOLHDL 3.1 09/18/2019   Lab Results  Component Value Date   HGBA1C 5.3 09/18/2019      Assessment & Plan:   Problem List Items Addressed This Visit      Nervous and Auditory   Post concussive syndrome   Relevant Medications   buPROPion (WELLBUTRIN XL) 300 MG 24 hr tablet   amitriptyline (ELAVIL) 50 MG tablet   cyclobenzaprine (FLEXERIL) 5 MG tablet   sertraline (ZOLOFT) 100 MG tablet   ALPRAZolam (XANAX) 0.5 MG tablet   Other Relevant Orders   Ambulatory referral to Neurology    Other Visit Diagnoses    Dizziness    -  Primary   Relevant Medications   scopolamine (TRANSDERM-SCOP, 1.5 MG,) 1 MG/3DAYS   Meclizine HCl 25 MG CHEW   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)       Relevant Medications   buPROPion (WELLBUTRIN XL) 300 MG 24 hr tablet   amitriptyline (ELAVIL) 50 MG tablet   sertraline (ZOLOFT) 100 MG tablet   ALPRAZolam (XANAX) 0.5 MG tablet   Other Relevant Orders   Ambulatory referral to Psychiatry   Depression, unspecified depression type       Relevant Medications   buPROPion (WELLBUTRIN XL) 300 MG 24 hr tablet   amitriptyline (ELAVIL) 50 MG tablet   sertraline (ZOLOFT) 100 MG tablet   ALPRAZolam (XANAX) 0.5 MG tablet   Sleep disturbance       Relevant Medications   amitriptyline (ELAVIL) 50 MG tablet   Chronic post-traumatic headache, not intractable       Relevant Medications   buPROPion (WELLBUTRIN XL) 300 MG 24 hr tablet   amitriptyline (ELAVIL) 50 MG tablet   butalbital-acetaminophen-caffeine (FIORICET) 50-325-40 MG tablet   cyclobenzaprine (FLEXERIL) 5 MG  tablet   ibuprofen (ADVIL) 600 MG tablet   sertraline (ZOLOFT) 100 MG tablet   Muscle spasm        Relevant Medications   cyclobenzaprine (FLEXERIL) 5 MG tablet   Dandruff in adult       Relevant Medications   ketoconazole (NIZORAL) 2 % shampoo   Non-intractable vomiting with nausea, unspecified vomiting type       Relevant Medications   ondansetron (ZOFRAN) 4 MG tablet      Meds ordered this encounter  Medications   buPROPion (WELLBUTRIN XL) 300 MG 24 hr tablet    Sig: Take 1 tablet (300 mg total) by mouth daily.    Dispense:  90 tablet    Refill:  1    Order Specific Question:   Supervising Provider    Answer:   Doristine Bosworth [id number]   scopolamine (TRANSDERM-SCOP, 1.5 MG,) 1 MG/3DAYS    Sig: Place 1 patch (1.5 mg total) onto the skin every 3 (three) days.    Dispense:  10 patch    Refill:  12    Order Specific Question:   Supervising Provider    Answer:   Doristine Bosworth [[id number]]   Meclizine HCl 25 MG CHEW    Sig: Chew 1 tablet (25 mg total) by mouth in the morning, at noon, and at bedtime.    Dispense:  90 tablet    Refill:  3    Order Specific Question:   Supervising Provider    Answer:   Doristine Bosworth [[id number]]   DISCONTD: ALPRAZolam (XANAX) 0.5 MG tablet    Sig: Take 1 tablet (0.5 mg total) by mouth at bedtime as needed for anxiety.    Dispense:  60 tablet    Refill:  0    Order Specific Question:   Supervising Provider    Answer:   Doristine Bosworth [id number]   amitriptyline (ELAVIL) 50 MG tablet    Sig: Take 1 tablet (50 mg total) by mouth at bedtime.    Dispense:  90 tablet    Refill:  0    Order Specific Question:   Supervising Provider    Answer:   Doristine Bosworth [id number]   butalbital-acetaminophen-caffeine (FIORICET) 50-325-40 MG tablet    Sig: Take 1-2 tablets by mouth every 6 (six) hours as needed for headache.    Dispense:  20 tablet    Refill:  0    Order Specific Question:   Supervising Provider    Answer:   Collie Siad A [[id number]]   cyclobenzaprine (FLEXERIL) 5 MG tablet    Sig: Take 2 tablets (10 mg total) by mouth 3  (three) times daily as needed for muscle spasms.    Dispense:  30 tablet    Refill:  2    Order Specific Question:   Supervising Provider    Answer:   Doristine Bosworth [id number]   ibuprofen (ADVIL) 600 MG tablet    Sig: Take 1 tablet (600 mg total) by mouth every 6 (six) hours as needed.    Dispense:  30 tablet    Refill:  2    This is a 30-day supply do not fill early    Order Specific Question:   Supervising Provider    Answer:   Doristine Bosworth [[id number]]   ketoconazole (NIZORAL) 2 % shampoo    Sig: Apply 1 application topically 2 (two) times a week.    Dispense:  120 mL    Refill:  0  Order Specific Question:   Supervising Provider    Answer:   Doristine Bosworth [[id number]]   sertraline (ZOLOFT) 100 MG tablet    Sig: Take 1 tablet (100 mg total) by mouth daily.    Dispense:  90 tablet    Refill:  3    Order Specific Question:   Supervising Provider    Answer:   Collie Siad A [[id number]]   ondansetron (ZOFRAN) 4 MG tablet    Sig: Take 1 tablet (4 mg total) by mouth every 8 (eight) hours as needed for nausea or vomiting.    Dispense:  20 tablet    Refill:  0    Order Specific Question:   Supervising Provider    Answer:   Doristine Bosworth [[id number]]   ALPRAZolam (XANAX) 0.5 MG tablet    Sig: Take 1 tablet (0.5 mg total) by mouth at bedtime as needed for anxiety.    Dispense:  60 tablet    Refill:  0    Order Specific Question:   Supervising Provider    Answer:   Doristine Bosworth [id number]    Follow-up: No follow-ups on file.   PLAN  Refill zoloft, ondansetron, alprazolam, nizoral shampoo, flexeril  Will try initiating wellbutrin , scopalamine, increasing meclizine.  Refer to psychiatry as he has a reliable translator for his moroccan dialect at this time  Refer to neurology to continue to explore local options for his complex case  Patient encouraged to call clinic with any questions, comments, or concerns.  Janeece Agee, NP

## 2019-12-05 ENCOUNTER — Telehealth: Payer: Self-pay | Admitting: Physical Therapy

## 2019-12-05 ENCOUNTER — Telehealth: Payer: Self-pay | Admitting: Registered Nurse

## 2019-12-05 ENCOUNTER — Encounter: Payer: Self-pay | Admitting: Family Medicine

## 2019-12-05 ENCOUNTER — Ambulatory Visit: Payer: BC Managed Care – PPO | Admitting: Occupational Therapy

## 2019-12-05 ENCOUNTER — Encounter: Payer: Self-pay | Admitting: Occupational Therapy

## 2019-12-05 DIAGNOSIS — R2681 Unsteadiness on feet: Secondary | ICD-10-CM | POA: Diagnosis not present

## 2019-12-05 DIAGNOSIS — R208 Other disturbances of skin sensation: Secondary | ICD-10-CM

## 2019-12-05 DIAGNOSIS — R41842 Visuospatial deficit: Secondary | ICD-10-CM

## 2019-12-05 DIAGNOSIS — R41840 Attention and concentration deficit: Secondary | ICD-10-CM

## 2019-12-05 DIAGNOSIS — M6281 Muscle weakness (generalized): Secondary | ICD-10-CM

## 2019-12-05 DIAGNOSIS — M25512 Pain in left shoulder: Secondary | ICD-10-CM

## 2019-12-05 DIAGNOSIS — R278 Other lack of coordination: Secondary | ICD-10-CM
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
## 2019-12-05 NOTE — Telephone Encounter (Signed)
Patient reports having a message from [facility] but unable to understand the message.  Pt attempted to call back but only got voicemail.  Therapist listened to voicemail on patient's phone and also attempted to contact Behavioral Health to set up initial appointment with patient's permission and patient in the treatment room with interpreter.  No answer, PT left voicemail with request for return call.  Dierdre Highman, PT, DPT 12/05/19    3:32 PM
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
## 2019-12-05 NOTE — Telephone Encounter (Signed)
Patient has referral to Dr. Karleen Hampshire for assessment of visual impairments.  Pt reports he has not been contacted by Dr. Jilda Roche office to set up initial appointment.  With patient's permission, PT contacted [facility] and spoke with administrative assistant who confirmed pt does have an appointment on May 17th at 8:45AM.  Discussed options for interpreter services - can only offer patient interpreter that speaks [place] form of Arabic which interpreter states is completely different than Arabic spoken in [place].  [facility] does not offer other options for interpreter services and advised pt to bring a relative or friend.  Pt to have friend accompany him to appointment for translation.    All appointment information provided to pt in English and then translated to Arabic by interpreter.    Pt verbalized understanding of appointment on Monday.  Dierdre Highman, PT, DPT 12/05/19    3:38 PM

## 2019-12-05 NOTE — Telephone Encounter (Signed)
Nikki with [facility]/ has a Dealer Nerve Conduction study/ but , what area of body and what DX? Needs to be  more specific.  Please put a note in epic or call [phone]

## 2019-12-05 NOTE — Telephone Encounter (Signed)
Pt saw you for some neurological issues along with motion sickness. Please advise the Dx code and location of the nerve study so that the Neuro team can schedule this

## 2019-12-06 NOTE — Telephone Encounter (Signed)
Spoke with Lowella Bandy from [facility], she is still having trouble understanding why he need this and where her phone number is [phone] she knows to expect your call

## 2019-12-06 NOTE — Telephone Encounter (Signed)
The diagnosis code I would like to use would be: G81. 94, Hemiplegia, unspecified affecting left nondominant side  Thank you  Jari Sportsman, NP

## 2019-12-07 ENCOUNTER — Ambulatory Visit: Payer: BC Managed Care – PPO | Admitting: Occupational Therapy

## 2019-12-07 ENCOUNTER — Ambulatory Visit: Payer: BC Managed Care – PPO | Admitting: Family Medicine

## 2019-12-07 ENCOUNTER — Other Ambulatory Visit: Payer: Self-pay

## 2019-12-07 ENCOUNTER — Ambulatory Visit: Payer: BC Managed Care – PPO

## 2019-12-07 VITALS — BP 120/84 | HR 86 | Ht 67.0 in

## 2019-12-07 DIAGNOSIS — F0781 Postconcussional syndrome: Secondary | ICD-10-CM | POA: Diagnosis not present

## 2019-12-07 MED ORDER — TRAZODONE HCL 50 MG PO TABS
50.0000 mg | ORAL_TABLET | Freq: Every day | ORAL | 1 refills | Status: DC
Start: 2019-12-07 — End: 2020-01-30

## 2019-12-07 MED ORDER — NORTRIPTYLINE HCL 75 MG PO CAPS: 75.0000 mg | ORAL_CAPSULE | Freq: Every day | ORAL | 1 refills | Status: DC

## 2019-12-07 NOTE — Patient Instructions (Addendum)
Thank you for coming in today.   I will talk with your team.    STOP amitriptyline.    Start trazodone for sleep at night.     Start Nortriptyline at night for headache.     Recheck with me in 2-4 weeks.
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
## 2019-12-08 ENCOUNTER — Encounter: Payer: Self-pay | Admitting: Physical Therapy

## 2019-12-08 ENCOUNTER — Ambulatory Visit: Payer: BC Managed Care – PPO | Admitting: Physical Therapy

## 2019-12-08 ENCOUNTER — Other Ambulatory Visit: Payer: Self-pay | Admitting: Registered Nurse

## 2019-12-08 DIAGNOSIS — R42 Dizziness and giddiness: Secondary | ICD-10-CM

## 2019-12-08 DIAGNOSIS — R2681 Unsteadiness on feet: Secondary | ICD-10-CM

## 2019-12-08 DIAGNOSIS — G44329 Chronic post-traumatic headache, not intractable: Secondary | ICD-10-CM

## 2019-12-08 DIAGNOSIS — R2689 Other abnormalities of gait and mobility: Secondary | ICD-10-CM

## 2019-12-08 DIAGNOSIS — M542 Cervicalgia: Secondary | ICD-10-CM

## 2019-12-08 DIAGNOSIS — M6281 Muscle weakness (generalized): Secondary | ICD-10-CM
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
## 2019-12-11 ENCOUNTER — Other Ambulatory Visit: Payer: Self-pay

## 2019-12-11 ENCOUNTER — Ambulatory Visit: Payer: BC Managed Care – PPO | Admitting: Physical Therapy

## 2019-12-11 DIAGNOSIS — R42 Dizziness and giddiness: Secondary | ICD-10-CM

## 2019-12-11 DIAGNOSIS — M6281 Muscle weakness (generalized): Secondary | ICD-10-CM

## 2019-12-11 DIAGNOSIS — M542 Cervicalgia: Secondary | ICD-10-CM

## 2019-12-11 DIAGNOSIS — R2681 Unsteadiness on feet: Secondary | ICD-10-CM

## 2019-12-11 DIAGNOSIS — R2689 Other abnormalities of gait and mobility: Secondary | ICD-10-CM

## 2019-12-11 NOTE — Patient Instructions (Signed)
Questions:  -Why are objects moving when I look at them or when I am concentrating? (reading or objects side to side and up and down).  Get tired and get a headache with focus.  -Get a note from the eye doctor and then explain what the doctor did and his recommendations; how will that change therapy?  -How will therapy work on the eyes?  -Meeting with concussion doctor and therapy - have eye doctor in meeting as well?

## 2019-12-12 ENCOUNTER — Ambulatory Visit: Payer: BC Managed Care – PPO | Admitting: Occupational Therapy

## 2019-12-12 ENCOUNTER — Encounter: Payer: Self-pay | Admitting: Occupational Therapy

## 2019-12-12 DIAGNOSIS — R278 Other lack of coordination: Secondary | ICD-10-CM

## 2019-12-12 DIAGNOSIS — R2681 Unsteadiness on feet: Secondary | ICD-10-CM | POA: Diagnosis not present

## 2019-12-12 DIAGNOSIS — R41842 Visuospatial deficit: Secondary | ICD-10-CM

## 2019-12-12 DIAGNOSIS — M25512 Pain in left shoulder: Secondary | ICD-10-CM

## 2019-12-12 DIAGNOSIS — R41840 Attention and concentration deficit: Secondary | ICD-10-CM

## 2019-12-12 DIAGNOSIS — M6281 Muscle weakness (generalized): Secondary | ICD-10-CM

## 2019-12-12 DIAGNOSIS — R208 Other disturbances of skin sensation: Secondary | ICD-10-CM
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
## 2019-12-13 ENCOUNTER — Encounter: Payer: Self-pay | Admitting: Physical Therapy

## 2019-12-13 ENCOUNTER — Ambulatory Visit: Payer: BC Managed Care – PPO | Admitting: Physical Therapy

## 2019-12-13 ENCOUNTER — Other Ambulatory Visit: Payer: Self-pay

## 2019-12-13 DIAGNOSIS — R42 Dizziness and giddiness: Secondary | ICD-10-CM

## 2019-12-13 DIAGNOSIS — M542 Cervicalgia: Secondary | ICD-10-CM

## 2019-12-13 DIAGNOSIS — R2681 Unsteadiness on feet: Secondary | ICD-10-CM | POA: Diagnosis not present

## 2019-12-13 DIAGNOSIS — R2689 Other abnormalities of gait and mobility: Secondary | ICD-10-CM

## 2019-12-13 DIAGNOSIS — M6281 Muscle weakness (generalized): Secondary | ICD-10-CM

## 2019-12-13 NOTE — Addendum Note (Signed)
Addended byNicholas Lose, Amirrah Quigley L on: 12/13/2019 03:30 PM   Modules accepted: Orders
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
## 2019-12-14 ENCOUNTER — Encounter: Payer: Self-pay | Admitting: Occupational Therapy

## 2019-12-14 ENCOUNTER — Ambulatory Visit: Payer: BC Managed Care – PPO | Admitting: Occupational Therapy

## 2019-12-14 DIAGNOSIS — R2681 Unsteadiness on feet: Secondary | ICD-10-CM | POA: Diagnosis not present

## 2019-12-14 DIAGNOSIS — M25512 Pain in left shoulder: Secondary | ICD-10-CM

## 2019-12-14 DIAGNOSIS — M6281 Muscle weakness (generalized): Secondary | ICD-10-CM

## 2019-12-14 DIAGNOSIS — R41840 Attention and concentration deficit: Secondary | ICD-10-CM

## 2019-12-14 DIAGNOSIS — R278 Other lack of coordination: Secondary | ICD-10-CM

## 2019-12-14 DIAGNOSIS — R41842 Visuospatial deficit: Secondary | ICD-10-CM

## 2019-12-14 DIAGNOSIS — R208 Other disturbances of skin sensation: Secondary | ICD-10-CM
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
## 2019-12-18 ENCOUNTER — Telehealth: Payer: Self-pay | Admitting: Registered Nurse

## 2019-12-18 ENCOUNTER — Encounter: Payer: Self-pay | Admitting: Physical Therapy

## 2019-12-18 ENCOUNTER — Other Ambulatory Visit: Payer: Self-pay

## 2019-12-18 ENCOUNTER — Ambulatory Visit: Payer: BC Managed Care – PPO | Admitting: Physical Therapy

## 2019-12-18 DIAGNOSIS — R42 Dizziness and giddiness: Secondary | ICD-10-CM

## 2019-12-18 DIAGNOSIS — M6281 Muscle weakness (generalized): Secondary | ICD-10-CM

## 2019-12-18 DIAGNOSIS — R2689 Other abnormalities of gait and mobility: Secondary | ICD-10-CM

## 2019-12-18 DIAGNOSIS — R2681 Unsteadiness on feet: Secondary | ICD-10-CM | POA: Diagnosis not present

## 2019-12-18 DIAGNOSIS — M542 Cervicalgia: Secondary | ICD-10-CM
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
## 2019-12-18 NOTE — Telephone Encounter (Signed)
Returned call to [phone] spoke w/ Verne Carrow. She just wanted to let us know that they had been looking for a certain type of Arabic interpreter and are in the process of trying to get in contact w/ patient to have him scheduled.

## 2019-12-18 NOTE — Telephone Encounter (Signed)
Hortencia Pilar at [facility] with [facility] [phone] requesting to speak with clinical staff in regard to patient need for psychiatry. States they have a referral. I do not see it. Please advise.

## 2019-12-20 ENCOUNTER — Ambulatory Visit: Payer: BC Managed Care – PPO | Admitting: Physical Therapy

## 2019-12-20 ENCOUNTER — Other Ambulatory Visit: Payer: Self-pay

## 2019-12-20 DIAGNOSIS — R2681 Unsteadiness on feet: Secondary | ICD-10-CM

## 2019-12-20 DIAGNOSIS — M6281 Muscle weakness (generalized): Secondary | ICD-10-CM

## 2019-12-20 DIAGNOSIS — R42 Dizziness and giddiness: Secondary | ICD-10-CM

## 2019-12-20 DIAGNOSIS — R2689 Other abnormalities of gait and mobility: Secondary | ICD-10-CM

## 2019-12-20 DIAGNOSIS — M542 Cervicalgia: Secondary | ICD-10-CM
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
## 2019-12-26 ENCOUNTER — Encounter: Payer: Self-pay | Admitting: Registered Nurse

## 2019-12-26 ENCOUNTER — Telehealth: Payer: Self-pay

## 2019-12-26 ENCOUNTER — Ambulatory Visit (INDEPENDENT_AMBULATORY_CARE_PROVIDER_SITE_OTHER): Payer: BC Managed Care – PPO | Admitting: Registered Nurse

## 2019-12-26 ENCOUNTER — Other Ambulatory Visit: Payer: Self-pay

## 2019-12-26 DIAGNOSIS — G44329 Chronic post-traumatic headache, not intractable: Secondary | ICD-10-CM | POA: Diagnosis not present

## 2019-12-26 DIAGNOSIS — R42 Dizziness and giddiness: Secondary | ICD-10-CM

## 2019-12-26 NOTE — Telephone Encounter (Signed)
Pt was seen in the office on 12/26/2019 to see [facility]. I called his [facility] Clarity office and he gave them consent to release and speak to Aura Dials regarding his medical condition. I also gave them the office # and Fax to contact the office if there was anything else that they need from [place] to keep the lines of communiaton open with pt.

## 2019-12-26 NOTE — Patient Instructions (Signed)
° ° ° °  If you have lab work done today you will be contacted with your lab results within the next 2 weeks.  If you have not heard from us then please contact us. The fastest way to get your results is to register for My Chart. ° ° °IF you received an x-ray today, you will receive an invoice from [facility]. Please contact [facility] at [phone] with questions or concerns regarding your invoice.  ° °IF you received labwork today, you will receive an invoice from [facility]. Please contact [facility] at [phone] with questions or concerns regarding your invoice.  ° °Our billing staff will not be able to assist you with questions regarding bills from these companies. ° °You will be contacted with the lab results as soon as they are available. The fastest way to get your results is to activate your My Chart account. Instructions are located on the last page of this paperwork. If you have not heard from us regarding the results in 2 weeks, please contact this office. °  ° ° ° °

## 2019-12-27 ENCOUNTER — Ambulatory Visit: Payer: BC Managed Care – PPO | Attending: Registered Nurse | Admitting: Occupational Therapy

## 2019-12-27 ENCOUNTER — Encounter: Payer: Self-pay | Admitting: Occupational Therapy

## 2019-12-27 ENCOUNTER — Ambulatory Visit: Payer: BC Managed Care – PPO

## 2019-12-27 DIAGNOSIS — R2689 Other abnormalities of gait and mobility: Secondary | ICD-10-CM | POA: Insufficient documentation

## 2019-12-27 DIAGNOSIS — R42 Dizziness and giddiness: Secondary | ICD-10-CM | POA: Diagnosis present

## 2019-12-27 DIAGNOSIS — M6281 Muscle weakness (generalized): Secondary | ICD-10-CM

## 2019-12-27 DIAGNOSIS — R278 Other lack of coordination: Secondary | ICD-10-CM | POA: Diagnosis present

## 2019-12-27 DIAGNOSIS — R41840 Attention and concentration deficit: Secondary | ICD-10-CM

## 2019-12-27 DIAGNOSIS — M542 Cervicalgia: Secondary | ICD-10-CM | POA: Diagnosis present

## 2019-12-27 DIAGNOSIS — R41842 Visuospatial deficit: Secondary | ICD-10-CM | POA: Insufficient documentation

## 2019-12-27 DIAGNOSIS — R2681 Unsteadiness on feet: Secondary | ICD-10-CM | POA: Diagnosis not present

## 2019-12-27 DIAGNOSIS — R209 Unspecified disturbances of skin sensation: Secondary | ICD-10-CM | POA: Insufficient documentation
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
## 2019-12-28 ENCOUNTER — Encounter: Payer: BC Managed Care – PPO | Attending: Psychology | Admitting: Psychology

## 2019-12-28 ENCOUNTER — Other Ambulatory Visit: Payer: Self-pay

## 2019-12-28 DIAGNOSIS — F0781 Postconcussional syndrome: Secondary | ICD-10-CM | POA: Insufficient documentation

## 2019-12-28 DIAGNOSIS — F322 Major depressive disorder, single episode, severe without psychotic features: Secondary | ICD-10-CM | POA: Diagnosis not present

## 2019-12-28 DIAGNOSIS — F447 Conversion disorder with mixed symptom presentation: Secondary | ICD-10-CM | POA: Diagnosis not present

## 2019-12-29 ENCOUNTER — Telehealth: Payer: Self-pay

## 2019-12-29 ENCOUNTER — Encounter: Payer: Self-pay | Admitting: Family Medicine

## 2019-12-29 ENCOUNTER — Ambulatory Visit (INDEPENDENT_AMBULATORY_CARE_PROVIDER_SITE_OTHER): Payer: BC Managed Care – PPO | Admitting: Family Medicine

## 2019-12-29 DIAGNOSIS — H8190 Unspecified disorder of vestibular function, unspecified ear: Secondary | ICD-10-CM | POA: Diagnosis not present

## 2019-12-29 DIAGNOSIS — H9311 Tinnitus, right ear: Secondary | ICD-10-CM

## 2019-12-29 DIAGNOSIS — H9191 Unspecified hearing loss, right ear: Secondary | ICD-10-CM | POA: Diagnosis not present

## 2019-12-29 DIAGNOSIS — F0781 Postconcussional syndrome: Secondary | ICD-10-CM

## 2019-12-29 HISTORY — DX: Unspecified disorder of vestibular function, unspecified ear: H81.90

## 2019-12-29 MED ORDER — NORTRIPTYLINE HCL 50 MG PO CAPS: 100.0000 mg | ORAL_CAPSULE | Freq: Every day | ORAL | 1 refills | Status: DC
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
## 2019-12-29 NOTE — Patient Instructions (Signed)
Thank you for coming in today.  Increase nortriptyline to 100mg  at bedtime.   Recheck in 2 weeks.   Schedule interpreter for next visit.  \Move visit to accomodates interpreter.   You should hear from audiology soon.
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
## 2019-12-31 ENCOUNTER — Encounter: Payer: Self-pay | Admitting: Registered Nurse

## 2020-01-01 ENCOUNTER — Encounter: Payer: Self-pay | Admitting: Physical Therapy

## 2020-01-01 ENCOUNTER — Ambulatory Visit: Payer: BC Managed Care – PPO | Admitting: Physical Therapy

## 2020-01-01 ENCOUNTER — Other Ambulatory Visit: Payer: Self-pay

## 2020-01-01 DIAGNOSIS — R42 Dizziness and giddiness: Secondary | ICD-10-CM

## 2020-01-01 DIAGNOSIS — R2681 Unsteadiness on feet: Secondary | ICD-10-CM | POA: Diagnosis not present

## 2020-01-01 DIAGNOSIS — R278 Other lack of coordination: Secondary | ICD-10-CM

## 2020-01-01 DIAGNOSIS — R2689 Other abnormalities of gait and mobility: Secondary | ICD-10-CM

## 2020-01-01 DIAGNOSIS — M542 Cervicalgia: Secondary | ICD-10-CM

## 2020-01-01 DIAGNOSIS — M6281 Muscle weakness (generalized): Secondary | ICD-10-CM
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
## 2020-01-02 ENCOUNTER — Encounter: Payer: Self-pay | Admitting: Occupational Therapy

## 2020-01-02 ENCOUNTER — Ambulatory Visit: Payer: BC Managed Care – PPO | Admitting: Occupational Therapy

## 2020-01-02 DIAGNOSIS — R2681 Unsteadiness on feet: Secondary | ICD-10-CM | POA: Diagnosis not present

## 2020-01-02 DIAGNOSIS — R208 Other disturbances of skin sensation: Secondary | ICD-10-CM

## 2020-01-02 DIAGNOSIS — M6281 Muscle weakness (generalized): Secondary | ICD-10-CM

## 2020-01-02 DIAGNOSIS — R41842 Visuospatial deficit: Secondary | ICD-10-CM

## 2020-01-02 DIAGNOSIS — R41840 Attention and concentration deficit: Secondary | ICD-10-CM

## 2020-01-02 DIAGNOSIS — R278 Other lack of coordination: Secondary | ICD-10-CM
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
## 2020-01-03 ENCOUNTER — Ambulatory Visit: Payer: BC Managed Care – PPO | Admitting: Physical Therapy

## 2020-01-03 ENCOUNTER — Other Ambulatory Visit: Payer: Self-pay

## 2020-01-03 DIAGNOSIS — R2689 Other abnormalities of gait and mobility: Secondary | ICD-10-CM

## 2020-01-03 DIAGNOSIS — R42 Dizziness and giddiness: Secondary | ICD-10-CM

## 2020-01-03 DIAGNOSIS — R208 Other disturbances of skin sensation: Secondary | ICD-10-CM

## 2020-01-03 DIAGNOSIS — R2681 Unsteadiness on feet: Secondary | ICD-10-CM | POA: Diagnosis not present

## 2020-01-03 DIAGNOSIS — M6281 Muscle weakness (generalized): Secondary | ICD-10-CM

## 2020-01-03 DIAGNOSIS — M542 Cervicalgia: Secondary | ICD-10-CM

## 2020-01-03 NOTE — Patient Instructions (Signed)
To Decrease the sensitivity of the nerves on the back of the head - take a wash cloth, wet it with warm water and rub it on the back or top of your head to decrease the sensitivity.
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
## 2020-01-04 ENCOUNTER — Encounter: Payer: Self-pay | Admitting: Psychology
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
## 2020-01-05 ENCOUNTER — Encounter: Payer: Self-pay | Admitting: Family Medicine
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
## 2020-01-08 ENCOUNTER — Ambulatory Visit: Payer: BC Managed Care – PPO | Admitting: Physical Therapy

## 2020-01-10 ENCOUNTER — Other Ambulatory Visit: Payer: BC Managed Care – PPO

## 2020-01-11 ENCOUNTER — Other Ambulatory Visit: Payer: Self-pay

## 2020-01-11 ENCOUNTER — Encounter: Payer: Self-pay | Admitting: Family Medicine

## 2020-01-11 ENCOUNTER — Ambulatory Visit (INDEPENDENT_AMBULATORY_CARE_PROVIDER_SITE_OTHER): Payer: BC Managed Care – PPO | Admitting: Family Medicine

## 2020-01-11 VITALS — BP 112/88 | HR 88 | Ht 67.0 in

## 2020-01-11 DIAGNOSIS — F0781 Postconcussional syndrome: Secondary | ICD-10-CM | POA: Diagnosis not present

## 2020-01-11 DIAGNOSIS — H8190 Unspecified disorder of vestibular function, unspecified ear: Secondary | ICD-10-CM

## 2020-01-11 NOTE — Patient Instructions (Addendum)
Thank you for coming in today.   I will try to figure out the audiology referral.    We have scheduled the MRI of your brain for 02/14/20 at 10am.    Schedule an appointment with me on 7/23.    Please schedule the interpreter as well.
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
## 2020-01-12 ENCOUNTER — Encounter: Payer: Self-pay | Admitting: Physical Therapy

## 2020-01-12 ENCOUNTER — Ambulatory Visit: Payer: BC Managed Care – PPO | Admitting: Physical Therapy

## 2020-01-12 ENCOUNTER — Other Ambulatory Visit: Payer: Self-pay

## 2020-01-12 DIAGNOSIS — R2681 Unsteadiness on feet: Secondary | ICD-10-CM | POA: Diagnosis not present

## 2020-01-12 DIAGNOSIS — R208 Other disturbances of skin sensation: Secondary | ICD-10-CM

## 2020-01-12 DIAGNOSIS — M6281 Muscle weakness (generalized): Secondary | ICD-10-CM

## 2020-01-12 DIAGNOSIS — R42 Dizziness and giddiness: Secondary | ICD-10-CM

## 2020-01-12 DIAGNOSIS — R2689 Other abnormalities of gait and mobility: Secondary | ICD-10-CM

## 2020-01-12 DIAGNOSIS — M542 Cervicalgia: Secondary | ICD-10-CM
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
## 2020-01-15 ENCOUNTER — Ambulatory Visit: Payer: BC Managed Care – PPO | Admitting: Physical Therapy

## 2020-01-17 ENCOUNTER — Encounter: Payer: BC Managed Care – PPO | Admitting: Occupational Therapy

## 2020-01-17 ENCOUNTER — Other Ambulatory Visit: Payer: Self-pay

## 2020-01-17 ENCOUNTER — Ambulatory Visit: Payer: BC Managed Care – PPO | Admitting: Physical Therapy

## 2020-01-17 DIAGNOSIS — R208 Other disturbances of skin sensation: Secondary | ICD-10-CM

## 2020-01-17 DIAGNOSIS — R2681 Unsteadiness on feet: Secondary | ICD-10-CM

## 2020-01-17 DIAGNOSIS — R2689 Other abnormalities of gait and mobility: Secondary | ICD-10-CM

## 2020-01-17 DIAGNOSIS — M6281 Muscle weakness (generalized): Secondary | ICD-10-CM

## 2020-01-17 DIAGNOSIS — M542 Cervicalgia: Secondary | ICD-10-CM

## 2020-01-17 DIAGNOSIS — R42 Dizziness and giddiness: Secondary | ICD-10-CM
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
## 2020-01-23 ENCOUNTER — Ambulatory Visit: Payer: BC Managed Care – PPO | Admitting: Audiologist

## 2020-01-25 ENCOUNTER — Ambulatory Visit: Payer: BC Managed Care – PPO | Attending: Registered Nurse | Admitting: Physical Therapy

## 2020-01-25 ENCOUNTER — Encounter: Payer: Self-pay | Admitting: Occupational Therapy

## 2020-01-25 ENCOUNTER — Other Ambulatory Visit: Payer: Self-pay

## 2020-01-25 ENCOUNTER — Encounter: Payer: Self-pay | Admitting: Physical Therapy

## 2020-01-25 ENCOUNTER — Ambulatory Visit: Payer: BC Managed Care – PPO | Admitting: Occupational Therapy

## 2020-01-25 VITALS — BP 135/92 | HR 78

## 2020-01-25 DIAGNOSIS — R2681 Unsteadiness on feet: Secondary | ICD-10-CM

## 2020-01-25 DIAGNOSIS — R2689 Other abnormalities of gait and mobility: Secondary | ICD-10-CM

## 2020-01-25 DIAGNOSIS — R209 Unspecified disturbances of skin sensation: Secondary | ICD-10-CM | POA: Insufficient documentation

## 2020-01-25 DIAGNOSIS — R278 Other lack of coordination: Secondary | ICD-10-CM | POA: Diagnosis present

## 2020-01-25 DIAGNOSIS — M6281 Muscle weakness (generalized): Secondary | ICD-10-CM | POA: Diagnosis not present

## 2020-01-25 DIAGNOSIS — G8929 Other chronic pain: Secondary | ICD-10-CM | POA: Insufficient documentation

## 2020-01-25 DIAGNOSIS — R41842 Visuospatial deficit: Secondary | ICD-10-CM | POA: Diagnosis present

## 2020-01-25 DIAGNOSIS — M542 Cervicalgia: Secondary | ICD-10-CM

## 2020-01-25 DIAGNOSIS — R208 Other disturbances of skin sensation: Secondary | ICD-10-CM

## 2020-01-25 DIAGNOSIS — M25512 Pain in left shoulder: Secondary | ICD-10-CM | POA: Insufficient documentation

## 2020-01-25 DIAGNOSIS — R41840 Attention and concentration deficit: Secondary | ICD-10-CM | POA: Insufficient documentation

## 2020-01-25 DIAGNOSIS — R42 Dizziness and giddiness: Secondary | ICD-10-CM
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
## 2020-01-26 ENCOUNTER — Ambulatory Visit: Payer: BC Managed Care – PPO | Admitting: Physical Therapy

## 2020-01-26 ENCOUNTER — Encounter: Payer: Self-pay | Admitting: Physical Therapy

## 2020-01-26 DIAGNOSIS — M6281 Muscle weakness (generalized): Secondary | ICD-10-CM

## 2020-01-26 DIAGNOSIS — R2681 Unsteadiness on feet: Secondary | ICD-10-CM

## 2020-01-26 DIAGNOSIS — R42 Dizziness and giddiness: Secondary | ICD-10-CM

## 2020-01-26 DIAGNOSIS — R2689 Other abnormalities of gait and mobility: Secondary | ICD-10-CM
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
## 2020-01-30 ENCOUNTER — Other Ambulatory Visit: Payer: Self-pay

## 2020-01-30 ENCOUNTER — Ambulatory Visit (INDEPENDENT_AMBULATORY_CARE_PROVIDER_SITE_OTHER): Payer: BC Managed Care – PPO | Admitting: Registered Nurse

## 2020-01-30 ENCOUNTER — Encounter: Payer: Self-pay | Admitting: Registered Nurse

## 2020-01-30 VITALS — BP 125/82 | HR 80 | Temp 97.9°F | Resp 17 | Ht 67.0 in | Wt 150.0 lb

## 2020-01-30 DIAGNOSIS — F329 Major depressive disorder, single episode, unspecified: Secondary | ICD-10-CM | POA: Diagnosis not present

## 2020-01-30 DIAGNOSIS — K047 Periapical abscess without sinus: Secondary | ICD-10-CM

## 2020-01-30 DIAGNOSIS — Z1159 Encounter for screening for other viral diseases: Secondary | ICD-10-CM

## 2020-01-30 DIAGNOSIS — M62838 Other muscle spasm: Secondary | ICD-10-CM | POA: Diagnosis not present

## 2020-01-30 DIAGNOSIS — G8929 Other chronic pain: Secondary | ICD-10-CM

## 2020-01-30 DIAGNOSIS — R42 Dizziness and giddiness: Secondary | ICD-10-CM

## 2020-01-30 DIAGNOSIS — R112 Nausea with vomiting, unspecified: Secondary | ICD-10-CM

## 2020-01-30 DIAGNOSIS — G479 Sleep disorder, unspecified: Secondary | ICD-10-CM

## 2020-01-30 DIAGNOSIS — F32A Depression, unspecified: Secondary | ICD-10-CM

## 2020-01-30 DIAGNOSIS — G44329 Chronic post-traumatic headache, not intractable: Secondary | ICD-10-CM | POA: Diagnosis not present

## 2020-01-30 MED ORDER — SCOPOLAMINE 1 MG/3DAYS TD PT72: 1.0000 | MEDICATED_PATCH | TRANSDERMAL | 12 refills | Status: DC

## 2020-01-30 MED ORDER — TRAZODONE HCL 50 MG PO TABS: 150.0000 mg | ORAL_TABLET | Freq: Every day | ORAL | 2 refills | Status: DC

## 2020-01-30 MED ORDER — ALPRAZOLAM 0.5 MG PO TABS: 0.5000 mg | ORAL_TABLET | Freq: Every evening | ORAL | 0 refills | Status: DC | PRN

## 2020-01-30 MED ORDER — AMOXICILLIN-POT CLAVULANATE 875-125 MG PO TABS: 1.0000 | ORAL_TABLET | Freq: Two times a day (BID) | ORAL | 0 refills | Status: DC

## 2020-01-30 MED ORDER — SERTRALINE HCL 100 MG PO TABS: 100.0000 mg | ORAL_TABLET | Freq: Every day | ORAL | 3 refills | Status: DC

## 2020-01-30 MED ORDER — CYCLOBENZAPRINE HCL 5 MG PO TABS: 10.0000 mg | ORAL_TABLET | Freq: Three times a day (TID) | ORAL | 2 refills | Status: DC | PRN

## 2020-01-30 MED ORDER — MECLIZINE HCL 25 MG PO CHEW: 25.0000 mg | CHEWABLE_TABLET | Freq: Three times a day (TID) | ORAL | 3 refills | Status: DC

## 2020-01-30 MED ORDER — NORTRIPTYLINE HCL 50 MG PO CAPS: 100.0000 mg | ORAL_CAPSULE | Freq: Every day | ORAL | 1 refills | Status: DC

## 2020-01-30 MED ORDER — ONDANSETRON HCL 4 MG PO TABS: 4.0000 mg | ORAL_TABLET | Freq: Three times a day (TID) | ORAL | 0 refills | Status: DC | PRN

## 2020-01-30 MED ORDER — BUTALBITAL-APAP-CAFFEINE 50-325-40 MG PO TABS: 1.0000 | ORAL_TABLET | Freq: Four times a day (QID) | ORAL | 0 refills | Status: DC | PRN

## 2020-01-30 MED ORDER — BUPROPION HCL ER (XL) 300 MG PO TB24: 300.0000 mg | ORAL_TABLET | Freq: Every day | ORAL | 1 refills | Status: DC

## 2020-01-30 NOTE — Patient Instructions (Addendum)
Mr Michelsen -   Infected tooth:  Take Augmentin 1 tablet in the morning and 1 tablet in the evening for 10 days.   Continue other medications as prescribed  For your loss of appetite: Try BOOST or ENSURE drinks that you can find at the pharmacy. They are generally healthy and can replace nutrients that you need.  I would like to see you again in about 5 weeks - this way we make sure that Dr. Denyse Amass has a chance to follow up with you after your MRI.    -   :   Augmentin 1    1     10  .            :   BOOST  ENSURE      .                    5   -               .    If you have lab work done today you will be contacted with your lab results within the next 2 weeks.  If you have not heard from Marland Kitchen then please contact us. The fastest way to get your results is to register for My Chart.   IF you received an x-ray today, you will receive an invoice from [facility]. Please contact [facility] at [phone] with questions or concerns regarding your invoice.   IF you received labwork today, you will receive an invoice from [facility]. Please contact [facility] at [phone] with questions or concerns regarding your invoice.   Our billing staff will not be able to assist you with questions regarding bills from these companies.  You will be contacted with the lab results as soon as they are available. The fastest way to get your results is to activate your My Chart account. Instructions are located on the last page of this paperwork. If you have not heard from [phone] regarding the results in 2 weeks, please contact this office.

## 2020-01-31 ENCOUNTER — Encounter: Payer: Self-pay | Admitting: Occupational Therapy

## 2020-01-31 ENCOUNTER — Encounter: Payer: Self-pay | Admitting: Physical Therapy

## 2020-01-31 ENCOUNTER — Ambulatory Visit: Payer: BC Managed Care – PPO | Admitting: Occupational Therapy

## 2020-01-31 ENCOUNTER — Ambulatory Visit: Payer: BC Managed Care – PPO | Admitting: Physical Therapy

## 2020-01-31 DIAGNOSIS — R2681 Unsteadiness on feet: Secondary | ICD-10-CM

## 2020-01-31 DIAGNOSIS — M6281 Muscle weakness (generalized): Secondary | ICD-10-CM

## 2020-01-31 DIAGNOSIS — R42 Dizziness and giddiness: Secondary | ICD-10-CM

## 2020-01-31 DIAGNOSIS — R278 Other lack of coordination: Secondary | ICD-10-CM

## 2020-01-31 DIAGNOSIS — G8929 Other chronic pain: Secondary | ICD-10-CM

## 2020-01-31 DIAGNOSIS — R208 Other disturbances of skin sensation: Secondary | ICD-10-CM

## 2020-01-31 DIAGNOSIS — M542 Cervicalgia: Secondary | ICD-10-CM

## 2020-01-31 DIAGNOSIS — R41840 Attention and concentration deficit: Secondary | ICD-10-CM

## 2020-01-31 DIAGNOSIS — R41842 Visuospatial deficit: Secondary | ICD-10-CM

## 2020-01-31 DIAGNOSIS — M25512 Pain in left shoulder: Secondary | ICD-10-CM

## 2020-01-31 DIAGNOSIS — R2689 Other abnormalities of gait and mobility: Secondary | ICD-10-CM

## 2020-01-31 LAB — MAGNESIUM: Magnesium: 2 mg/dL (ref 1.6–2.3)

## 2020-01-31 LAB — HEPATITIS C ANTIBODY: Hep C Virus Ab: <0.1 s/co ratio (ref 0.0–0.9)

## 2020-01-31 LAB — HIV ANTIBODY (ROUTINE TESTING W REFLEX): HIV Screen 4th Generation wRfx: NONREACTIVE

## 2020-01-31 LAB — VITAMIN B12: Vitamin B-12: 504 pg/mL (ref 232–1245)

## 2020-01-31 LAB — VITAMIN D 25 HYDROXY (VIT D DEFICIENCY, FRACTURES): Vit D, 25-Hydroxy: 16.4 ng/mL — ABNORMAL LOW (ref 30.0–100.0)
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
## 2020-02-02 ENCOUNTER — Ambulatory Visit: Payer: BC Managed Care – PPO | Admitting: Physical Therapy

## 2020-02-02 ENCOUNTER — Other Ambulatory Visit: Payer: Self-pay | Admitting: Registered Nurse

## 2020-02-02 ENCOUNTER — Encounter: Payer: Self-pay | Admitting: Physical Therapy

## 2020-02-02 ENCOUNTER — Other Ambulatory Visit: Payer: Self-pay

## 2020-02-02 DIAGNOSIS — M6281 Muscle weakness (generalized): Secondary | ICD-10-CM

## 2020-02-02 DIAGNOSIS — R2689 Other abnormalities of gait and mobility: Secondary | ICD-10-CM

## 2020-02-02 DIAGNOSIS — M542 Cervicalgia: Secondary | ICD-10-CM

## 2020-02-02 DIAGNOSIS — R42 Dizziness and giddiness: Secondary | ICD-10-CM

## 2020-02-02 DIAGNOSIS — E559 Vitamin D deficiency, unspecified: Secondary | ICD-10-CM

## 2020-02-02 DIAGNOSIS — R2681 Unsteadiness on feet: Secondary | ICD-10-CM

## 2020-02-02 DIAGNOSIS — R208 Other disturbances of skin sensation: Secondary | ICD-10-CM

## 2020-02-02 MED ORDER — VITAMIN D (ERGOCALCIFEROL) 1.25 MG (50000 UNIT) PO CAPS: 50000.0000 [IU] | ORAL_CAPSULE | ORAL | 0 refills | Status: DC

## 2020-02-02 NOTE — Progress Notes (Signed)
Hello -   If we could call Shawn Edwards - his Vitamin D is pretty low. I want to start him on a weekly supplement of 50,000 units for the next 8 weeks. This should help with his pain to some extent  Thank you  Jari Sportsman
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
## 2020-02-03 NOTE — Patient Instructions (Signed)
Occipital Neuralgia  Occipital neuralgia is a type of headache that causes brief episodes of very bad pain in the back of your head. Pain from occipital neuralgia may spread (radiate) to other parts of your head. These headaches may be caused by irritation of the nerves that leave your spinal cord high up in your neck, just below the base of your skull (occipital nerves). Your occipital nerves transmit sensations from the back of your head, the top of your head, and the areas behind your ears. What are the causes? This condition can occur without any known cause (primary headache syndrome). In other cases, this condition is caused by pressure on or irritation of one of the two occipital nerves. Pressure and irritation may be due to:  Muscle spasm in the neck.  Neck injury.  Wear and tear of the vertebrae in the neck (osteoarthritis).  Disease of the disks that separate the vertebrae.  Swollen blood vessels that put pressure on the occipital nerves.  Infections.  Tumors.  Diabetes. What are the signs or symptoms? This condition causes brief burning, stabbing, electric, shocking, or shooting pain which can radiate to the top of the head. It can happen on one side or both sides of the head. It can also cause:  Pain behind the eye.  Pain triggered by neck movement or hair brushing.  Scalp tenderness.  Aching in the back of the head between episodes of very bad pain.  Pain gets worse with exposure to bright lights. How is this diagnosed? There is no test that diagnoses this condition. Your health care provider may diagnose this condition based on a physical exam and your symptoms. Other tests may be done, such as:  Imaging studies of the brain and neck (cervical spine), such as an MRI or CT scan. These look for causes of pinched nerves.  Applying pressure to the nerves in the neck to try to re-create the pain.  Injection of numbing medicine into the occipital nerve areas to see if  pain goes away (diagnostic nerve block). How is this treated? Treatment for this condition may begin with simple measures, such as:  Rest.  Massage.  Applying heat or cold on the area.  Over-the-counter pain relievers. If these measures do not work, you may need other treatments, including:  Medicines, such as: ? Prescription-strength anti-inflammatory medicines. ? Muscle relaxants. ? Anti-seizure medicines, which can relieve pain. ? Antidepressants, which can relieve pain. ? Injected medicines, such as medicines that numb the area (local anesthetic) and steroids.  Pulsed radiofrequency ablation. This is when wires are implanted to deliver electrical impulses that block pain signals from the occipital nerve.  Surgery to relieve nerve pressure.  Physical therapy. Follow these instructions at home: Pain management      Avoid any activities that cause pain.  Rest when you have an attack of pain.  Try gentle massage to relieve pain.  Try a different pillow or sleeping position.  If directed, apply heat to the affected area as told by your health care provider. Use the heat source that your health care provider recommends, such as a moist heat pack or a heating pad. ? Place a towel between your skin and the heat source. ? Leave the heat on for 20-30 minutes. ? Remove the heat if your skin turns bright red. This is especially important if you are unable to feel pain, heat, or cold. You may have a greater risk of getting burned.  If directed, apply ice to the   back of the head and neck area as told by your health care provider. ? Put ice in a plastic bag. ? Place a towel between your skin and the bag. ? Leave the ice on for 20 minutes, 2-3 times per day. General instructions  Take over-the-counter and prescription medicines only as told by your health care provider.  Avoid things that make your symptoms worse, such as bright lights.  Try to stay active. Get regular  exercise that does not cause pain. Ask your health care provider to suggest safe exercises for you.  Work with a physical therapist to learn stretching exercises you can do at home.  Practice good posture.  Keep all follow-up visits as told by your health care provider. This is important. Contact a health care provider if:  Your medicine is not working.  You have new or worsening symptoms. Get help right away if:  You have very bad head pain that does not go away.  You have a sudden change in vision, balance, or speech. Summary  Occipital neuralgia is a type of headache that causes brief episodes of very bad pain in the back of your head.  Pain from occipital neuralgia may spread (radiate) to other parts of your head.  Treatment for this condition includes rest, massage, and medicines. This information is not intended to replace advice given to you by your health care provider. Make sure you discuss any questions you have with your health care provider. Document Revised: 06/29/2017 Document Reviewed: 09/17/2016 Elsevier Patient Education  2020 [employer]  

## 2020-02-05 ENCOUNTER — Other Ambulatory Visit: Payer: Self-pay

## 2020-02-05 ENCOUNTER — Ambulatory Visit: Payer: BC Managed Care – PPO | Admitting: Occupational Therapy

## 2020-02-05 ENCOUNTER — Ambulatory Visit: Payer: BC Managed Care – PPO | Admitting: Physical Therapy

## 2020-02-05 ENCOUNTER — Encounter: Payer: Self-pay | Admitting: Occupational Therapy

## 2020-02-05 DIAGNOSIS — R208 Other disturbances of skin sensation: Secondary | ICD-10-CM

## 2020-02-05 DIAGNOSIS — G8929 Other chronic pain: Secondary | ICD-10-CM

## 2020-02-05 DIAGNOSIS — M25512 Pain in left shoulder: Secondary | ICD-10-CM

## 2020-02-05 DIAGNOSIS — R42 Dizziness and giddiness: Secondary | ICD-10-CM

## 2020-02-05 DIAGNOSIS — R2681 Unsteadiness on feet: Secondary | ICD-10-CM

## 2020-02-05 DIAGNOSIS — R278 Other lack of coordination: Secondary | ICD-10-CM

## 2020-02-05 DIAGNOSIS — M542 Cervicalgia: Secondary | ICD-10-CM

## 2020-02-05 DIAGNOSIS — M6281 Muscle weakness (generalized): Secondary | ICD-10-CM

## 2020-02-05 DIAGNOSIS — R41840 Attention and concentration deficit: Secondary | ICD-10-CM

## 2020-02-05 DIAGNOSIS — R41842 Visuospatial deficit: Secondary | ICD-10-CM

## 2020-02-05 DIAGNOSIS — R2689 Other abnormalities of gait and mobility: Secondary | ICD-10-CM
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
## 2020-02-06 ENCOUNTER — Encounter: Payer: Self-pay | Admitting: Radiology
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
## 2020-02-08 ENCOUNTER — Ambulatory Visit: Payer: BC Managed Care – PPO | Admitting: Audiologist

## 2020-02-09 ENCOUNTER — Ambulatory Visit: Payer: BC Managed Care – PPO | Admitting: Physical Therapy

## 2020-02-09 ENCOUNTER — Other Ambulatory Visit: Payer: Self-pay

## 2020-02-09 DIAGNOSIS — R42 Dizziness and giddiness: Secondary | ICD-10-CM

## 2020-02-09 DIAGNOSIS — R2681 Unsteadiness on feet: Secondary | ICD-10-CM

## 2020-02-09 DIAGNOSIS — R2689 Other abnormalities of gait and mobility: Secondary | ICD-10-CM

## 2020-02-09 DIAGNOSIS — M6281 Muscle weakness (generalized): Secondary | ICD-10-CM
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
## 2020-02-13 ENCOUNTER — Ambulatory Visit: Payer: BC Managed Care – PPO | Admitting: Neurology

## 2020-02-13 ENCOUNTER — Encounter: Payer: Self-pay | Admitting: Neurology

## 2020-02-13 ENCOUNTER — Telehealth: Payer: Self-pay | Admitting: Neurology

## 2020-02-13 ENCOUNTER — Other Ambulatory Visit: Payer: Self-pay

## 2020-02-13 ENCOUNTER — Ambulatory Visit: Payer: BC Managed Care – PPO | Admitting: Diagnostic Neuroimaging

## 2020-02-13 ENCOUNTER — Ambulatory Visit: Payer: BC Managed Care – PPO | Admitting: Occupational Therapy

## 2020-02-13 ENCOUNTER — Encounter: Payer: Self-pay | Admitting: Occupational Therapy

## 2020-02-13 VITALS — BP 130/85 | HR 80 | Ht 67.0 in | Wt 151.9 lb

## 2020-02-13 DIAGNOSIS — R2681 Unsteadiness on feet: Secondary | ICD-10-CM

## 2020-02-13 DIAGNOSIS — R208 Other disturbances of skin sensation: Secondary | ICD-10-CM

## 2020-02-13 DIAGNOSIS — M25512 Pain in left shoulder: Secondary | ICD-10-CM

## 2020-02-13 DIAGNOSIS — F447 Conversion disorder with mixed symptom presentation: Secondary | ICD-10-CM | POA: Diagnosis not present

## 2020-02-13 DIAGNOSIS — R41840 Attention and concentration deficit: Secondary | ICD-10-CM

## 2020-02-13 DIAGNOSIS — M6281 Muscle weakness (generalized): Secondary | ICD-10-CM | POA: Diagnosis not present

## 2020-02-13 DIAGNOSIS — R278 Other lack of coordination: Secondary | ICD-10-CM

## 2020-02-13 DIAGNOSIS — R41842 Visuospatial deficit: Secondary | ICD-10-CM
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
## 2020-02-13 NOTE — Telephone Encounter (Signed)
Please discuss any follow up request with Dr. Lucia Gaskins or Amy before scheduling this patient again

## 2020-02-13 NOTE — Telephone Encounter (Signed)
Please do not schedule follow up of new consult prior to talking with Dr. Lucia Gaskins or Amy, leaving this in a phone note in case he calls hopefully you can see this. thanks

## 2020-02-13 NOTE — Patient Instructions (Signed)
Conversion Disorder Conversion disorder is also known as functional neurological symptom disorder. It is a mental (psychiatric) condition in which a person shows symptoms of a problem with the brain, spinal cord, or nerves (nervous system), such as a stroke or seizure. However, these symptoms cannot be explained by a medical condition. This means that the symptoms are real, but the condition is not caused by an injury to the nervous system. It is important to note that the symptoms are not faked. Rather, they point to a person's underlying depression, stress, or anxiety. These symptoms are often started (triggered) by a recent or past stressful event. Conversion disorder may begin anytime from late childhood through the young adult years. This disorder is more common in females than males. What are the causes? The exact cause of this disorder is not known. What increases the risk? You are more likely to develop this condition if:  You are in a very stressful situation.  You have a psychiatric condition, such as depression, anxiety, or bipolar disorder.  You have a personality disorder, such as histrionic personality disorder, borderline personality disorder, or narcissistic personality disorder.  You have a history of childhood abuse. What are the signs or symptoms? Symptoms of this condition may include:  Muscle weakness or paralysis. If this involves the bladder muscle, it can cause difficulty urinating or loss of bladder control.  Spells of uncontrollable shaking or spells that look like seizures (pseudoseizures).  Abnormal movements, such as tremors, jerks, muscle spasms, loss of balance, or difficulty walking.  Difficulty swallowing, or a feeling of having a lump in the throat.  Loss of voice (aphonia), stuttering, or a struggle with speech movements (dysarthria).  Loss of the sensation of touch or pain.  Changes in vision, such as blindness or double vision.  Seeing or hearing  things that are not there (hallucinations).  Changes in hearing, including deafness. Symptoms usually occur suddenly, often around times of intense stress. How is this diagnosed? This condition is diagnosed based on an exam by your health care provider. Exams and tests will also be done to make sure you do not have serious physical health problems. These exams and tests will vary depending on your specific symptoms. They may include:  A physical exam. This involves tests to check for problems with the brain or another part of the nervous system (neurological exam).  Blood and urine tests.  X-rays or other imaging studies.  An electroencephalogram (EEG). This monitors brain waves to look for seizures. Your health care provider may refer you to a mental health specialist for more tests. How is this treated? The main goal of treatment is to get the symptoms to go away as soon as possible. For most people, this condition may be treated with:  Relaxation techniques.  Reassurance from the health care provider that the symptoms are stress-related. For people with symptoms that do not go away, treatment options may include:  Counseling. Acceptance of the diagnosis is the first step toward successful treatment of the disorder.  Medicine. Antidepressant medicine may be helpful even if a person with conversion symptoms is not depressed.  Cognitive behavioral therapy (CBT). This involves discussing stressful life events that may have triggered the symptoms and talking about ways to cope with the stress.  Physical therapy (PT) and occupational therapy (OT). OT can help to restore strength, and PT can improve walking if your strength and the way that you walk (gait) have been affected.  Amobarbital interview (rare). This involves discussing   stressful life events that may have triggered the symptoms. The mental health specialist asks you questions after you take a short-acting medicine called  amobarbital. This medicine puts a person in a hypnotic state. Follow these instructions at home:  Take over-the-counter and prescription medicines only as told by your health care provider.  Work with your health care provider to identify and reduce stress (use stress management).  Keep all follow-up visits as told by your health care provider. This is important. Follow-up visits may include counseling or physical therapy sessions. Contact a health care provider if:  Your symptoms do not go away or they become worse.  You develop new symptoms. Get help right away if:  You have serious thoughts about hurting yourself or someone else. If you ever feel like you may hurt yourself or others, or have thoughts about taking your own life, get help right away. You can go to your nearest emergency department or call:  Your local emergency services (911 in the [place]  A suicide crisis helpline, such as the National Suicide Prevention Lifeline at 1-800-273-8255. This is open 24 hours a day. Summary  Conversion disorder is a mental (psychiatric) disorder with symptoms that wrongly suggest an injury to the brain, spinal cord, or nerves (nervous system).  Symptoms of conversion disorder may include weakness, numbness, and spells that look like seizures.  Treatment may involve counseling for stress management, talk therapy, medicines, and physical or occupational therapy.  If you ever feel like you may hurt yourself or others, or have thoughts about taking your own life, get help right away. This information is not intended to replace advice given to you by your health care provider. Make sure you discuss any questions you have with your health care provider. Document Revised: 06/25/2017 Document Reviewed: 05/03/2017 Elsevier Patient Education  2020 Elsevier Inc.  
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
## 2020-02-14 ENCOUNTER — Other Ambulatory Visit: Payer: Self-pay

## 2020-02-14 ENCOUNTER — Ambulatory Visit
Admission: RE | Admit: 2020-02-14 | Discharge: 2020-02-14 | Disposition: A | Discharge status: Home / Self Care | Payer: BC Managed Care – PPO | Source: Ambulatory Visit | Attending: Registered Nurse | Admitting: Registered Nurse

## 2020-02-14 DIAGNOSIS — G44329 Chronic post-traumatic headache, not intractable: Secondary | ICD-10-CM

## 2020-02-14 MED ORDER — GADOBENATE DIMEGLUMINE 529 MG/ML IV SOLN
15.0000 mL | Freq: Once | INTRAVENOUS | Status: AC | PRN
  Administered 2020-02-14: 15 mL via INTRAVENOUS

## 2020-02-16 ENCOUNTER — Encounter: Payer: Self-pay | Admitting: Family Medicine

## 2020-02-16 ENCOUNTER — Other Ambulatory Visit: Payer: Self-pay

## 2020-02-16 ENCOUNTER — Ambulatory Visit (INDEPENDENT_AMBULATORY_CARE_PROVIDER_SITE_OTHER): Payer: BC Managed Care – PPO | Admitting: Family Medicine

## 2020-02-16 VITALS — BP 120/82 | HR 87 | Ht 67.0 in | Wt 151.0 lb

## 2020-02-16 DIAGNOSIS — H9191 Unspecified hearing loss, right ear: Secondary | ICD-10-CM | POA: Diagnosis not present

## 2020-02-16 DIAGNOSIS — H8190 Unspecified disorder of vestibular function, unspecified ear: Secondary | ICD-10-CM

## 2020-02-16 DIAGNOSIS — F0781 Postconcussional syndrome: Secondary | ICD-10-CM | POA: Diagnosis not present

## 2020-02-16 NOTE — Patient Instructions (Signed)
Your Audiology appointment   Aug 4th at 1045. Audiology is located. [address] [phone]  Recheck with me after Aug 9th.
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
## 2020-02-19 ENCOUNTER — Other Ambulatory Visit: Payer: Self-pay

## 2020-02-19 ENCOUNTER — Encounter: Payer: BC Managed Care – PPO | Admitting: Occupational Therapy

## 2020-02-19 ENCOUNTER — Ambulatory Visit: Payer: BC Managed Care – PPO | Admitting: Physical Therapy

## 2020-02-19 DIAGNOSIS — M6281 Muscle weakness (generalized): Secondary | ICD-10-CM

## 2020-02-19 DIAGNOSIS — R2689 Other abnormalities of gait and mobility: Secondary | ICD-10-CM

## 2020-02-19 DIAGNOSIS — R2681 Unsteadiness on feet: Secondary | ICD-10-CM

## 2020-02-19 DIAGNOSIS — R42 Dizziness and giddiness: Secondary | ICD-10-CM
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
## 2020-02-22 ENCOUNTER — Ambulatory Visit: Payer: BC Managed Care – PPO | Admitting: Physical Therapy

## 2020-02-26 ENCOUNTER — Other Ambulatory Visit: Payer: Self-pay

## 2020-02-26 ENCOUNTER — Ambulatory Visit: Payer: BC Managed Care – PPO | Attending: Registered Nurse | Admitting: Physical Therapy

## 2020-02-26 DIAGNOSIS — M542 Cervicalgia: Secondary | ICD-10-CM | POA: Insufficient documentation

## 2020-02-26 DIAGNOSIS — R42 Dizziness and giddiness: Secondary | ICD-10-CM | POA: Diagnosis present

## 2020-02-26 DIAGNOSIS — H9313 Tinnitus, bilateral: Secondary | ICD-10-CM | POA: Insufficient documentation

## 2020-02-26 DIAGNOSIS — H903 Sensorineural hearing loss, bilateral: Secondary | ICD-10-CM | POA: Insufficient documentation

## 2020-02-26 DIAGNOSIS — R2689 Other abnormalities of gait and mobility: Secondary | ICD-10-CM | POA: Diagnosis present

## 2020-02-26 DIAGNOSIS — R2681 Unsteadiness on feet: Secondary | ICD-10-CM | POA: Insufficient documentation

## 2020-02-26 DIAGNOSIS — M6281 Muscle weakness (generalized): Secondary | ICD-10-CM | POA: Diagnosis not present

## 2020-02-26 DIAGNOSIS — H93233 Hyperacusis, bilateral: Secondary | ICD-10-CM | POA: Insufficient documentation
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
## 2020-02-27 ENCOUNTER — Encounter: Payer: Self-pay | Admitting: Registered Nurse

## 2020-02-27 ENCOUNTER — Ambulatory Visit (INDEPENDENT_AMBULATORY_CARE_PROVIDER_SITE_OTHER): Payer: BC Managed Care – PPO | Admitting: Registered Nurse

## 2020-02-27 DIAGNOSIS — G44329 Chronic post-traumatic headache, not intractable: Secondary | ICD-10-CM

## 2020-02-27 DIAGNOSIS — F329 Major depressive disorder, single episode, unspecified: Secondary | ICD-10-CM

## 2020-02-27 DIAGNOSIS — R42 Dizziness and giddiness: Secondary | ICD-10-CM | POA: Diagnosis not present

## 2020-02-27 DIAGNOSIS — E559 Vitamin D deficiency, unspecified: Secondary | ICD-10-CM

## 2020-02-27 DIAGNOSIS — F32A Depression, unspecified: Secondary | ICD-10-CM

## 2020-02-27 DIAGNOSIS — M62838 Other muscle spasm: Secondary | ICD-10-CM

## 2020-02-27 DIAGNOSIS — G479 Sleep disorder, unspecified: Secondary | ICD-10-CM

## 2020-02-27 NOTE — Patient Instructions (Signed)
° ° ° °  If you have lab work done today you will be contacted with your lab results within the next 2 weeks.  If you have not heard from us then please contact us. The fastest way to get your results is to register for My Chart. ° ° °IF you received an x-ray today, you will receive an invoice from [facility]. Please contact [facility] at [phone] with questions or concerns regarding your invoice.  ° °IF you received labwork today, you will receive an invoice from [facility]. Please contact [facility] at [phone] with questions or concerns regarding your invoice.  ° °Our billing staff will not be able to assist you with questions regarding bills from these companies. ° °You will be contacted with the lab results as soon as they are available. The fastest way to get your results is to activate your My Chart account. Instructions are located on the last page of this paperwork. If you have not heard from us regarding the results in 2 weeks, please contact this office. °  ° ° ° °

## 2020-02-28 ENCOUNTER — Other Ambulatory Visit: Payer: Self-pay

## 2020-02-28 ENCOUNTER — Encounter: Payer: Self-pay | Admitting: Audiologist

## 2020-02-28 ENCOUNTER — Ambulatory Visit: Payer: BC Managed Care – PPO | Admitting: Audiologist

## 2020-02-28 DIAGNOSIS — M6281 Muscle weakness (generalized): Secondary | ICD-10-CM | POA: Diagnosis not present

## 2020-02-28 DIAGNOSIS — H93233 Hyperacusis, bilateral: Secondary | ICD-10-CM

## 2020-02-28 DIAGNOSIS — H9313 Tinnitus, bilateral: Secondary | ICD-10-CM

## 2020-02-28 DIAGNOSIS — H903 Sensorineural hearing loss, bilateral: Secondary | ICD-10-CM

## 2020-02-28 HISTORY — PX: AUDIOLOGICAL EVALUATION: AUD1007

## 2020-02-28 MED ORDER — BUTALBITAL-APAP-CAFFEINE 50-325-40 MG PO TABS: 1.0000 | ORAL_TABLET | Freq: Four times a day (QID) | ORAL | 0 refills | Status: DC | PRN

## 2020-02-28 MED ORDER — NORTRIPTYLINE HCL 50 MG PO CAPS: 100.0000 mg | ORAL_CAPSULE | Freq: Every day | ORAL | 1 refills | Status: DC

## 2020-02-28 MED ORDER — CYCLOBENZAPRINE HCL 5 MG PO TABS: 10.0000 mg | ORAL_TABLET | Freq: Three times a day (TID) | ORAL | 2 refills | Status: DC | PRN

## 2020-02-28 MED ORDER — SCOPOLAMINE 1 MG/3DAYS TD PT72: 1.0000 | MEDICATED_PATCH | TRANSDERMAL | 12 refills | Status: DC

## 2020-02-28 MED ORDER — BUPROPION HCL ER (XL) 300 MG PO TB24: 300.0000 mg | ORAL_TABLET | Freq: Every day | ORAL | 1 refills | Status: DC

## 2020-02-28 MED ORDER — VITAMIN D (ERGOCALCIFEROL) 1.25 MG (50000 UNIT) PO CAPS: 50000.0000 [IU] | ORAL_CAPSULE | ORAL | 0 refills | Status: DC

## 2020-02-28 MED ORDER — SERTRALINE HCL 100 MG PO TABS: 100.0000 mg | ORAL_TABLET | Freq: Every day | ORAL | 3 refills | Status: DC
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
## 2020-02-28 NOTE — Progress Notes (Signed)
Established Patient Office Visit  Subjective:  Patient ID: Shawn Edwards, male    DOB: 04-06-1981  Age: 39 y.o. MRN: [id number]  CC:  Chief Complaint  Patient presents with  . Follow-up    x4 weeks. Loss of appetite has not gotten any better    HPI Shawn Edwards presents for follow up   Feeling better, stronger with ongoing therapy. Still having dizziness with rapid movement.  Consults with ortho, PT, OT, and other specialists in combination with normal head MRI have not shown any anatomic abnormalities suggestive of reasons for his ongoing weakness. There is concern for a psychosomatic aspect to his ongoing symptoms, however, it's been difficult to have him established with a psychiatrist given his language barrier.   No urgent concerns or complaints today.  Requesting a note to dentist. Needs some refills - will be able to place these today.   Past Medical History:  Diagnosis Date  . Concussion 02/04/2019  . Functional neurological symptom disorder with mixed symptoms   . Known health problems: none   . 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 NO PAST SURGERIES      History reviewed. No pertinent family history.  Social History   Socioeconomic History  . Marital status: Single    Spouse name: Not on file  . Number of children: 0  . Years of education: Not on file  . Highest education level: Associate degree: academic program  Occupational History  . Not on file  Tobacco Use  . Smoking status: Never Smoker  . Smokeless tobacco: Never Used  Vaping Use  . Vaping Use: Never used  Substance and Sexual Activity  . Alcohol use: Never  . Drug use: Never  . Sexual activity: Not on file  Other Topics Concern  . Not on file  Social History Narrative   Lives alone   Right handed   Caffeine: sometimes soda   Social Determinants of Health   Financial Resource Strain:   . Difficulty of Paying Living Expenses:   Food  Insecurity:   . Worried About Programme researcher, broadcasting/film/video in the Last Year:   . Barista in the Last Year:   Transportation Needs:   . Freight forwarder (Medical):   Marland Kitchen Lack of Transportation (Non-Medical):   Physical Activity:   . Days of Exercise per Week:   . Minutes of Exercise per Session:   Stress:   . Feeling of Stress :   Social Connections:   . Frequency of Communication with Friends and Family:   . Frequency of Social Gatherings with Friends and Family:   . Attends Religious Services:   . Active Member of Clubs or Organizations:   . Attends Banker Meetings:   Marland Kitchen Marital Status:   Intimate Partner Violence:   . Fear of Current or Ex-Partner:   . Emotionally Abused:   Marland Kitchen Physically Abused:   . Sexually Abused:     Outpatient Medications Prior to Visit  Medication Sig Dispense Refill  . ALPRAZolam (XANAX) 0.5 MG tablet Take 1 tablet (0.5 mg total) by mouth at bedtime as needed for anxiety. 60 tablet 0  . buPROPion (WELLBUTRIN XL) 300 MG 24 hr tablet Take 1 tablet (300 mg total) by mouth daily. 90 tablet 1  . butalbital-acetaminophen-caffeine (FIORICET) 50-325-40 MG tablet Take 1-2 tablets by mouth every 6 (six) hours as needed for headache. 20 tablet 0  . cyclobenzaprine (FLEXERIL) 5 MG  tablet Take 2 tablets (10 mg total) by mouth 3 (three) times daily as needed for muscle spasms. 30 tablet 2  . ibuprofen (ADVIL) 600 MG tablet Take 1 tablet (600 mg total) by mouth every 6 (six) hours as needed. 30 tablet 2  . ketoconazole (NIZORAL) 2 % shampoo Apply 1 application topically 2 (two) times a week. 120 mL 0  . Meclizine HCl 25 MG CHEW Chew 1 tablet (25 mg total) by mouth in the morning, at noon, and at bedtime. 90 tablet 3  . nortriptyline (PAMELOR) 50 MG capsule Take 2 capsules (100 mg total) by mouth at bedtime. For headache 30 capsule 1  . ondansetron (ZOFRAN) 4 MG tablet Take 1 tablet (4 mg total) by mouth every 8 (eight) hours as needed for nausea or  vomiting. 20 tablet 0  . scopolamine (TRANSDERM-SCOP, 1.5 MG,) 1 MG/3DAYS Place 1 patch (1.5 mg total) onto the skin every 3 (three) days. 10 patch 12  . sertraline (ZOLOFT) 100 MG tablet Take 1 tablet (100 mg total) by mouth daily. 90 tablet 3  . terbinafine (LAMISIL) 250 MG tablet Take 1 tablet (250 mg total) by mouth daily. 84 tablet 0  . traZODone (DESYREL) 50 MG tablet Take 3 tablets (150 mg total) by mouth at bedtime. For sleep. 180 tablet 2  . Vitamin D, Ergocalciferol, (DRISDOL) 1.25 MG (50000 UNIT) CAPS capsule Take 1 capsule (50,000 Units total) by mouth every 7 (seven) days. 8 capsule 0   No facility-administered medications prior to visit.    Allergies  Allergen Reactions  . Pork-Derived Products     ROS Review of Systems  Constitutional: Positive for appetite change. Negative for activity change, chills, diaphoresis, fatigue, fever and unexpected weight change.  HENT: Negative.   Eyes: Negative.   Respiratory: Negative.   Cardiovascular: Negative.   Gastrointestinal: Negative.   Endocrine: Negative.   Genitourinary: Negative.   Musculoskeletal: Negative.   Skin: Negative.   Allergic/Immunologic: Negative.   Neurological: Negative.   Hematological: Negative.   Psychiatric/Behavioral: Negative.   All other systems reviewed and are negative.     Objective:    Physical Exam Vitals and nursing note reviewed.  Constitutional:      General: He is not in acute distress.    Appearance: Normal appearance. He is normal weight. He is not ill-appearing, toxic-appearing or diaphoretic.  Cardiovascular:     Heart sounds: Normal heart sounds.  Pulmonary:     Effort: Pulmonary effort is normal. No respiratory distress.     Breath sounds: Normal breath sounds.  Neurological:     General: No focal deficit present.     Mental Status: He is alert and oriented to person, place, and time. Mental status is at baseline.  Psychiatric:        Attention and Perception: Attention  and perception normal.        Mood and Affect: Mood is anxious. Affect is labile.        Speech: Speech normal.        Behavior: Behavior normal. Behavior is cooperative.        Thought Content: Thought content normal.        Cognition and Memory: Cognition and memory normal.        Judgment: Judgment normal.     BP 119/79 (BP Location: Right Arm, Patient Position: Sitting, Cuff Size: Normal)   Pulse 77   Temp 97.6 F (36.4 C) (Temporal)   Wt 151 lb (68.5 kg)   SpO2 96%  BMI 23.65 kg/m  Wt Readings from Last 3 Encounters:  02/27/20 151 lb (68.5 kg)  02/16/20 151 lb (68.5 kg)  02/13/20 151 lb 14.4 oz (68.9 kg)     Health Maintenance Due  Topic Date Due  . INFLUENZA VACCINE  02/25/2020    There are no preventive care reminders to display for this patient.  Lab Results  Component Value Date   TSH 2.190 09/18/2019   Lab Results  Component Value Date   WBC 5.3 09/18/2019   HGB 15.8 09/18/2019   HCT 44.1 09/18/2019   MCV 90 09/18/2019   PLT 216 09/18/2019   Lab Results  Component Value Date   NA 140 09/18/2019   K 3.8 09/18/2019   CO2 24 09/18/2019   GLUCOSE 97 09/18/2019   BUN 14 09/18/2019   CREATININE 0.79 09/18/2019   BILITOT 0.5 09/18/2019   ALKPHOS 53 09/18/2019   AST 21 09/18/2019   ALT 30 09/18/2019   PROT 7.1 09/18/2019   ALBUMIN 4.6 09/18/2019   CALCIUM 9.5 09/18/2019   ANIONGAP 11 02/05/2019   Lab Results  Component Value Date   CHOL 209 (H) 09/18/2019   Lab Results  Component Value Date   HDL 68 09/18/2019   Lab Results  Component Value Date   LDLCALC 117 (H) 09/18/2019   Lab Results  Component Value Date   TRIG 136 09/18/2019   Lab Results  Component Value Date   CHOLHDL 3.1 09/18/2019   Lab Results  Component Value Date   HGBA1C 5.3 09/18/2019      Assessment & Plan:   Problem List Items Addressed This Visit    None    Visit Diagnoses    Depression, unspecified depression type       Relevant Medications    buPROPion (WELLBUTRIN XL) 300 MG 24 hr tablet   nortriptyline (PAMELOR) 50 MG capsule   sertraline (ZOLOFT) 100 MG tablet   Chronic post-traumatic headache, not intractable       Relevant Medications   buPROPion (WELLBUTRIN XL) 300 MG 24 hr tablet   butalbital-acetaminophen-caffeine (FIORICET) 50-325-40 MG tablet   cyclobenzaprine (FLEXERIL) 5 MG tablet   nortriptyline (PAMELOR) 50 MG capsule   sertraline (ZOLOFT) 100 MG tablet   Muscle spasm       Relevant Medications   cyclobenzaprine (FLEXERIL) 5 MG tablet   Dizziness       Relevant Medications   nortriptyline (PAMELOR) 50 MG capsule   scopolamine (TRANSDERM-SCOP, 1.5 MG,) 1 MG/3DAYS   Sleep disturbance       Relevant Medications   nortriptyline (PAMELOR) 50 MG capsule   Vitamin D deficiency       Relevant Medications   Vitamin D, Ergocalciferol, (DRISDOL) 1.25 MG (50000 UNIT) CAPS capsule      Meds ordered this encounter  Medications  . buPROPion (WELLBUTRIN XL) 300 MG 24 hr tablet    Sig: Take 1 tablet (300 mg total) by mouth daily.    Dispense:  90 tablet    Refill:  1    Order Specific Question:   Supervising Provider    Answer:   Neva SeatGREENE, JEFFREY R [[id number]]  . butalbital-acetaminophen-caffeine (FIORICET) 50-325-40 MG tablet    Sig: Take 1-2 tablets by mouth every 6 (six) hours as needed for headache.    Dispense:  20 tablet    Refill:  0    Order Specific Question:   Supervising Provider    Answer:   Neva SeatGREENE, JEFFREY R [[id number]]  . cyclobenzaprine (FLEXERIL) 5  MG tablet    Sig: Take 2 tablets (10 mg total) by mouth 3 (three) times daily as needed for muscle spasms.    Dispense:  30 tablet    Refill:  2    Order Specific Question:   Supervising Provider    Answer:   Neva Seat, JEFFREY R [[id number]]  . nortriptyline (PAMELOR) 50 MG capsule    Sig: Take 2 capsules (100 mg total) by mouth at bedtime. For headache    Dispense:  30 capsule    Refill:  1    Arabic. Increased to 100mg  from 75mg     Order Specific Question:    Supervising Provider    Answer:   , JEFFREY R [[id number]]  . scopolamine (TRANSDERM-SCOP, 1.5 MG,) 1 MG/3DAYS    Sig: Place 1 patch (1.5 mg total) onto the skin every 3 (three) days.    Dispense:  10 patch    Refill:  12    Order Specific Question:   Supervising Provider    Answer:   , JEFFREY R [[id number]]  . sertraline (ZOLOFT) 100 MG tablet    Sig: Take 1 tablet (100 mg total) by mouth daily.    Dispense:  90 tablet    Refill:  3    Order Specific Question:   Supervising Provider    Answer:   Neva Seat, JEFFREY R [[id number]]  . Vitamin D, Ergocalciferol, (DRISDOL) 1.25 MG (50000 UNIT) CAPS capsule    Sig: Take 1 capsule (50,000 Units total) by mouth every 7 (seven) days.    Dispense:  8 capsule    Refill:  0    Order Specific Question:   Supervising Provider    Answer:   Neva Seat, JEFFREY R [[id number]]    Follow-up: No follow-ups on file.   PLAN  Mr Munch and I had a conversation today regarding physical deconditioning and the toll that can take after a year of extremely limited activity, as well as the role of fear and past trauma and their relationship to perceived recovery. While an obviously difficult subject to address, particularly through the language barrier, I am sure that Mr. Humbarger is starting to acknowledge that his recovery may be occurring at a faster rate than he had perceived to be within his limits. I am optimistic that with continued PT, increased home activity and exercises, and the audiology assessment that is taking place on 02/28/20, we can continue to see improvement in his symptoms.  Patient encouraged to call clinic with any questions, comments, or concerns.  Randon Goldsmith, NP

## 2020-03-04 ENCOUNTER — Ambulatory Visit: Payer: BC Managed Care – PPO | Admitting: Physical Therapy

## 2020-03-04 ENCOUNTER — Other Ambulatory Visit: Payer: Self-pay

## 2020-03-04 DIAGNOSIS — R2681 Unsteadiness on feet: Secondary | ICD-10-CM

## 2020-03-04 DIAGNOSIS — M6281 Muscle weakness (generalized): Secondary | ICD-10-CM

## 2020-03-04 DIAGNOSIS — R42 Dizziness and giddiness: Secondary | ICD-10-CM

## 2020-03-04 DIAGNOSIS — R2689 Other abnormalities of gait and mobility: Secondary | ICD-10-CM
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
## 2020-03-06 ENCOUNTER — Encounter: Payer: Self-pay | Admitting: Family Medicine

## 2020-03-06 ENCOUNTER — Ambulatory Visit (INDEPENDENT_AMBULATORY_CARE_PROVIDER_SITE_OTHER): Payer: BC Managed Care – PPO | Admitting: Family Medicine

## 2020-03-06 ENCOUNTER — Other Ambulatory Visit: Payer: Self-pay

## 2020-03-06 VITALS — BP 130/88 | HR 94 | Ht 67.0 in

## 2020-03-06 DIAGNOSIS — F447 Conversion disorder with mixed symptom presentation: Secondary | ICD-10-CM | POA: Diagnosis not present

## 2020-03-06 DIAGNOSIS — H8190 Unspecified disorder of vestibular function, unspecified ear: Secondary | ICD-10-CM

## 2020-03-06 DIAGNOSIS — F0781 Postconcussional syndrome: Secondary | ICD-10-CM | POA: Diagnosis not present
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
## 2020-03-06 NOTE — Patient Instructions (Addendum)
Thank you for coming in today.  Continue physical therapy.   We will work with psychiatry as well. I will ask your doctor about it.   Reschedule with me in 2-3 months.

## 2020-03-11 ENCOUNTER — Other Ambulatory Visit: Payer: Self-pay | Admitting: Registered Nurse

## 2020-03-11 DIAGNOSIS — F323 Major depressive disorder, single episode, severe with psychotic features: Secondary | ICD-10-CM

## 2020-03-11 DIAGNOSIS — F329 Major depressive disorder, single episode, unspecified: Secondary | ICD-10-CM

## 2020-03-11 DIAGNOSIS — G44329 Chronic post-traumatic headache, not intractable: Secondary | ICD-10-CM

## 2020-03-11 DIAGNOSIS — F32A Depression, unspecified: Secondary | ICD-10-CM

## 2020-03-18 ENCOUNTER — Ambulatory Visit: Payer: BC Managed Care – PPO | Admitting: Physical Therapy

## 2020-03-22 ENCOUNTER — Ambulatory Visit: Payer: BC Managed Care – PPO | Admitting: Physical Therapy

## 2020-03-22 ENCOUNTER — Other Ambulatory Visit: Payer: Self-pay

## 2020-03-22 ENCOUNTER — Encounter: Payer: Self-pay | Admitting: Physical Therapy

## 2020-03-22 DIAGNOSIS — M6281 Muscle weakness (generalized): Secondary | ICD-10-CM | POA: Diagnosis not present

## 2020-03-22 DIAGNOSIS — R42 Dizziness and giddiness: Secondary | ICD-10-CM

## 2020-03-22 DIAGNOSIS — M542 Cervicalgia: Secondary | ICD-10-CM

## 2020-03-22 DIAGNOSIS — R2681 Unsteadiness on feet: Secondary | ICD-10-CM

## 2020-03-22 DIAGNOSIS — R2689 Other abnormalities of gait and mobility: Secondary | ICD-10-CM
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
## 2020-03-25 ENCOUNTER — Encounter: Payer: Self-pay | Admitting: Physical Therapy

## 2020-03-25 ENCOUNTER — Other Ambulatory Visit: Payer: Self-pay

## 2020-03-25 ENCOUNTER — Ambulatory Visit: Payer: BC Managed Care – PPO | Admitting: Physical Therapy

## 2020-03-25 VITALS — BP 122/79 | HR 91

## 2020-03-25 DIAGNOSIS — R2681 Unsteadiness on feet: Secondary | ICD-10-CM

## 2020-03-25 DIAGNOSIS — M6281 Muscle weakness (generalized): Secondary | ICD-10-CM

## 2020-03-25 DIAGNOSIS — R2689 Other abnormalities of gait and mobility: Secondary | ICD-10-CM

## 2020-03-25 DIAGNOSIS — R42 Dizziness and giddiness: Secondary | ICD-10-CM
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
## 2020-03-29 ENCOUNTER — Ambulatory Visit: Payer: BC Managed Care – PPO | Attending: Registered Nurse | Admitting: Physical Therapy

## 2020-03-29 ENCOUNTER — Encounter: Payer: Self-pay | Admitting: Physical Therapy

## 2020-03-29 ENCOUNTER — Other Ambulatory Visit: Payer: Self-pay

## 2020-03-29 DIAGNOSIS — R2681 Unsteadiness on feet: Secondary | ICD-10-CM | POA: Diagnosis present

## 2020-03-29 DIAGNOSIS — R42 Dizziness and giddiness: Secondary | ICD-10-CM | POA: Insufficient documentation

## 2020-03-29 DIAGNOSIS — R2689 Other abnormalities of gait and mobility: Secondary | ICD-10-CM

## 2020-03-29 DIAGNOSIS — M542 Cervicalgia: Secondary | ICD-10-CM | POA: Diagnosis present

## 2020-03-29 DIAGNOSIS — M6281 Muscle weakness (generalized): Secondary | ICD-10-CM | POA: Insufficient documentation
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
## 2020-04-05 ENCOUNTER — Encounter: Payer: Self-pay | Admitting: Physical Therapy

## 2020-04-05 ENCOUNTER — Other Ambulatory Visit: Payer: Self-pay

## 2020-04-05 ENCOUNTER — Ambulatory Visit: Payer: BC Managed Care – PPO | Admitting: Physical Therapy

## 2020-04-05 DIAGNOSIS — M6281 Muscle weakness (generalized): Secondary | ICD-10-CM | POA: Diagnosis not present

## 2020-04-05 DIAGNOSIS — R2689 Other abnormalities of gait and mobility: Secondary | ICD-10-CM

## 2020-04-05 DIAGNOSIS — R2681 Unsteadiness on feet: Secondary | ICD-10-CM

## 2020-04-05 DIAGNOSIS — R42 Dizziness and giddiness: Secondary | ICD-10-CM
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
## 2020-04-08 ENCOUNTER — Encounter: Payer: Self-pay | Admitting: Physical Therapy

## 2020-04-08 ENCOUNTER — Ambulatory Visit: Payer: BC Managed Care – PPO | Admitting: Physical Therapy

## 2020-04-08 ENCOUNTER — Other Ambulatory Visit: Payer: Self-pay

## 2020-04-08 ENCOUNTER — Ambulatory Visit: Payer: Self-pay | Admitting: *Deleted

## 2020-04-08 VITALS — BP 135/83 | HR 83

## 2020-04-08 DIAGNOSIS — R2681 Unsteadiness on feet: Secondary | ICD-10-CM

## 2020-04-08 DIAGNOSIS — R2689 Other abnormalities of gait and mobility: Secondary | ICD-10-CM

## 2020-04-08 DIAGNOSIS — M6281 Muscle weakness (generalized): Secondary | ICD-10-CM | POA: Diagnosis not present

## 2020-04-08 DIAGNOSIS — R42 Dizziness and giddiness: Secondary | ICD-10-CM

## 2020-04-08 NOTE — Telephone Encounter (Signed)
Shawn Edwards, PT working with patient at outpatient session, # [phone], called to report patient c/o left side chest pain and left arm pain only lasting a "few moments".  Reports pain started over weekend with nausea and vomiting x1 on Saturday with headache reported over weekend. VS 135/83 HR 83 O2 sat 98% on RA. Pain in chest comes and goes. Patient reports he is out of some of his medications and that is when pain started. Appt. Scheduled for 04/09/20 with PCP. Instructed Audra to have patient to go to ED if pain in chest worsens. Instruct patient to call back and request medication refill for medications he is out of at this time. Care advise given . Patient's therapist Shawn Edwards verbalized understanding of care advise and to review with patient to call back and to go to ED if symptoms worsen.   Reason for Disposition . [1] Chest pain lasting < 5 minutes AND [2] NO chest pain or cardiac symptoms (e.g., breathing difficulty, sweating) now (Exception: chest pains that last only a few seconds)  Answer Assessment - Initial Assessment Questions 1. LOCATION: "Where does it hurt?"       Left chest and left arm nausea and vomiting over weekend.headache  2. RADIATION: "Does the pain go anywhere else?" (e.g., into neck, jaw, arms, back)     Just down left arm  3. ONSET: "When did the chest pain begin?" (Minutes, hours or days)      2 days ago  4. PATTERN "Does the pain come and go, or has it been constant since it started?"  "Does it get worse with exertion?"      Comes and goes .  5. DURATION: "How long does it last" (e.g., seconds, minutes, hours)     Only few minutes  6. SEVERITY: "How bad is the pain?"  (e.g., Scale 1-10; mild, moderate, or severe)    - MILD (1-3): doesn't interfere with normal activities     - MODERATE (4-7): interferes with normal activities or awakens from sleep    - SEVERE (8-10): excruciating pain, unable to do any normal activities       Mild  7. CARDIAC RISK FACTORS: "Do you have  any history of heart problems or risk factors for heart disease?" (e.g., angina, prior heart attack; diabetes, high blood pressure, high cholesterol, smoker, or strong family history of heart disease)     Hx dizziness taking meclizine  8. PULMONARY RISK FACTORS: "Do you have any history of lung disease?"  (e.g., blood clots in lung, asthma, emphysema, birth control pills)     na 9. CAUSE: "What do you think is causing the chest pain?"     Unknown. Patient reported out of some medications but could not tell which ones 10. OTHER SYMPTOMS: "Do you have any other symptoms?" (e.g., dizziness, nausea, vomiting, sweating, fever, difficulty breathing, cough)      Nausea, vomiting on Saturday , dizziness  Protocols used: CHEST PAIN-A-AH

## 2020-04-09 ENCOUNTER — Encounter: Payer: Self-pay | Admitting: Registered Nurse

## 2020-04-09 ENCOUNTER — Other Ambulatory Visit: Payer: Self-pay

## 2020-04-09 ENCOUNTER — Ambulatory Visit (INDEPENDENT_AMBULATORY_CARE_PROVIDER_SITE_OTHER): Payer: BC Managed Care – PPO | Admitting: Registered Nurse

## 2020-04-09 VITALS — BP 132/83 | HR 84 | Temp 97.9°F | Resp 18 | Ht 67.0 in

## 2020-04-09 DIAGNOSIS — F329 Major depressive disorder, single episode, unspecified: Secondary | ICD-10-CM | POA: Diagnosis not present

## 2020-04-09 DIAGNOSIS — G479 Sleep disorder, unspecified: Secondary | ICD-10-CM | POA: Diagnosis not present

## 2020-04-09 DIAGNOSIS — R42 Dizziness and giddiness: Secondary | ICD-10-CM

## 2020-04-09 DIAGNOSIS — F32A Depression, unspecified: Secondary | ICD-10-CM

## 2020-04-09 DIAGNOSIS — M272 Inflammatory conditions of jaws: Secondary | ICD-10-CM

## 2020-04-09 DIAGNOSIS — R112 Nausea with vomiting, unspecified: Secondary | ICD-10-CM

## 2020-04-09 DIAGNOSIS — J302 Other seasonal allergic rhinitis: Secondary | ICD-10-CM

## 2020-04-09 DIAGNOSIS — E559 Vitamin D deficiency, unspecified: Secondary | ICD-10-CM | POA: Diagnosis not present

## 2020-04-09 DIAGNOSIS — K5904 Chronic idiopathic constipation: Secondary | ICD-10-CM | POA: Diagnosis not present

## 2020-04-09 DIAGNOSIS — R079 Chest pain, unspecified: Secondary | ICD-10-CM

## 2020-04-09 DIAGNOSIS — R531 Weakness: Secondary | ICD-10-CM

## 2020-04-09 MED ORDER — ALPRAZOLAM 0.5 MG PO TABS: 0.5000 mg | ORAL_TABLET | Freq: Every evening | ORAL | 0 refills | Status: DC | PRN

## 2020-04-09 MED ORDER — CETIRIZINE HCL 10 MG PO TABS: 10.0000 mg | ORAL_TABLET | Freq: Every day | ORAL | 11 refills | Status: DC

## 2020-04-09 MED ORDER — MECLIZINE HCL 25 MG PO CHEW: 25.0000 mg | CHEWABLE_TABLET | Freq: Three times a day (TID) | ORAL | 3 refills | Status: DC

## 2020-04-09 MED ORDER — FLUTICASONE PROPIONATE 50 MCG/ACT NA SUSP: 2.0000 | Freq: Every day | NASAL | 6 refills | Status: DC

## 2020-04-09 MED ORDER — SCOPOLAMINE 1 MG/3DAYS TD PT72: 1.0000 | MEDICATED_PATCH | TRANSDERMAL | 12 refills | Status: DC

## 2020-04-09 MED ORDER — AMOXICILLIN-POT CLAVULANATE 875-125 MG PO TABS: 1.0000 | ORAL_TABLET | Freq: Two times a day (BID) | ORAL | 0 refills | Status: DC

## 2020-04-09 MED ORDER — POLYETHYLENE GLYCOL 3350 17 GM/SCOOP PO POWD: 17.0000 g | Freq: Two times a day (BID) | ORAL | 1 refills | Status: DC | PRN

## 2020-04-09 MED ORDER — VITAMIN D (ERGOCALCIFEROL) 1.25 MG (50000 UNIT) PO CAPS: 50000.0000 [IU] | ORAL_CAPSULE | ORAL | 0 refills | Status: DC

## 2020-04-09 NOTE — Telephone Encounter (Signed)
Do we need to schedule patient appt or need to be sent to the ED
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
## 2020-04-09 NOTE — Patient Instructions (Signed)
° ° ° °  If you have lab work done today you will be contacted with your lab results within the next 2 weeks.  If you have not heard from us then please contact us. The fastest way to get your results is to register for My Chart. ° ° °IF you received an x-ray today, you will receive an invoice from [facility]. Please contact [facility] at [phone] with questions or concerns regarding your invoice.  ° °IF you received labwork today, you will receive an invoice from [facility]. Please contact [facility] at [phone] with questions or concerns regarding your invoice.  ° °Our billing staff will not be able to assist you with questions regarding bills from these companies. ° °You will be contacted with the lab results as soon as they are available. The fastest way to get your results is to activate your My Chart account. Instructions are located on the last page of this paperwork. If you have not heard from us regarding the results in 2 weeks, please contact this office. °  ° ° ° °

## 2020-04-09 NOTE — Telephone Encounter (Signed)
He's scheduled for today - this afternoon. If you wouldn't mind giving him a quick call to see how he's doing - if there are COVID concerns we can have him go to ED or if there are any red flags for worsening cardiac issue same thing - otherwise we will see him this afternoon   Thanks,  Jari Sportsman, NP

## 2020-04-09 NOTE — Telephone Encounter (Signed)
Left a msg for patient. Calling to checking to see how the patient. If no better or have covid concern they can go tot he ED but if no concern or no worse we will see them at their appt this afternoon.

## 2020-04-10 ENCOUNTER — Encounter: Payer: Self-pay | Admitting: Physical Therapy

## 2020-04-10 ENCOUNTER — Other Ambulatory Visit: Payer: Self-pay | Admitting: Registered Nurse

## 2020-04-10 ENCOUNTER — Ambulatory Visit: Payer: BC Managed Care – PPO | Admitting: Physical Therapy

## 2020-04-10 DIAGNOSIS — M542 Cervicalgia: Secondary | ICD-10-CM

## 2020-04-10 DIAGNOSIS — R2681 Unsteadiness on feet: Secondary | ICD-10-CM

## 2020-04-10 DIAGNOSIS — M6281 Muscle weakness (generalized): Secondary | ICD-10-CM | POA: Diagnosis not present

## 2020-04-10 DIAGNOSIS — G44329 Chronic post-traumatic headache, not intractable: Secondary | ICD-10-CM

## 2020-04-10 DIAGNOSIS — R42 Dizziness and giddiness: Secondary | ICD-10-CM

## 2020-04-10 DIAGNOSIS — R112 Nausea with vomiting, unspecified: Secondary | ICD-10-CM

## 2020-04-10 DIAGNOSIS — R2689 Other abnormalities of gait and mobility: Secondary | ICD-10-CM

## 2020-04-10 LAB — CBC WITH DIFFERENTIAL
Basophils Absolute: 0.1 10*3/uL (ref 0.0–0.2)
Basos: 1 %
EOS (ABSOLUTE): 0.1 10*3/uL (ref 0.0–0.4)
Eos: 1 %
Hematocrit: 45.5 % (ref 37.5–51.0)
Hemoglobin: 15.7 g/dL (ref 13.0–17.7)
Immature Grans (Abs): 0 10*3/uL (ref 0.0–0.1)
Immature Granulocytes: 0 %
Lymphocytes Absolute: 2.2 10*3/uL (ref 0.7–3.1)
Lymphs: 38 %
MCH: 31.2 pg (ref 26.6–33.0)
MCHC: 34.5 g/dL (ref 31.5–35.7)
MCV: 91 fL (ref 79–97)
Monocytes Absolute: 0.4 10*3/uL (ref 0.1–0.9)
Monocytes: 8 %
Neutrophils Absolute: 2.9 10*3/uL (ref 1.4–7.0)
Neutrophils: 52 %
RBC: 5.03 x10E6/uL (ref 4.14–5.80)
RDW: 12.6 % (ref 11.6–15.4)
WBC: 5.6 10*3/uL (ref 3.4–10.8)

## 2020-04-10 LAB — COMPREHENSIVE METABOLIC PANEL
ALT: 19 IU/L (ref 0–44)
AST: 15 IU/L (ref 0–40)
Albumin/Globulin Ratio: 1.6 (ref 1.2–2.2)
Albumin: 4.5 g/dL (ref 4.0–5.0)
Alkaline Phosphatase: 59 IU/L (ref 44–121)
BUN/Creatinine Ratio: 16 (ref 9–20)
BUN: 12 mg/dL (ref 6–24)
Bilirubin Total: 0.4 mg/dL (ref 0.0–1.2)
CO2: 26 mmol/L (ref 20–29)
Calcium: 9.7 mg/dL (ref 8.7–10.2)
Chloride: 101 mmol/L (ref 96–106)
Creatinine, Ser: 0.75 mg/dL — ABNORMAL LOW (ref 0.76–1.27)
GFR calc Af Amer: 133 mL/min/{1.73_m2} (ref >59)
GFR calc non Af Amer: 115 mL/min/{1.73_m2} (ref >59)
Globulin, Total: 2.9 g/dL (ref 1.5–4.5)
Glucose: 98 mg/dL (ref 65–99)
Potassium: 3.9 mmol/L (ref 3.5–5.2)
Sodium: 140 mmol/L (ref 134–144)
Total Protein: 7.4 g/dL (ref 6.0–8.5)

## 2020-04-10 LAB — SAR COV2 SEROLOGY (COVID19)AB(IGG),IA: DiaSorin SARS-CoV-2 Ab, IgG: NEGATIVE

## 2020-04-10 LAB — VITAMIN D 25 HYDROXY (VIT D DEFICIENCY, FRACTURES): Vit D, 25-Hydroxy: 14.3 ng/mL — ABNORMAL LOW (ref 30.0–100.0)
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
## 2020-04-11 ENCOUNTER — Encounter: Payer: Self-pay | Admitting: Registered Nurse

## 2020-04-11 ENCOUNTER — Telehealth: Payer: Self-pay | Admitting: *Deleted

## 2020-04-11 NOTE — Telephone Encounter (Signed)
Patient is requesting a refill of the following medications: Requested Prescriptions   Pending Prescriptions Disp Refills   butalbital-acetaminophen-caffeine (FIORICET) 50-325-40 MG tablet [Pharmacy Med Name: BUTALBITAL/ACETAMINOPHEN/CAFF TABS] 20 tablet     Sig: TAKE 1 TO 2 TABLETS BY MOUTH EVERY 6 HOURS AS NEEDED FOR HEADACHE   Signed Prescriptions Disp Refills   ondansetron (ZOFRAN) 4 MG tablet 20 tablet 0    Sig: TAKE 1 TABLET(4 MG) BY MOUTH EVERY 8 HOURS AS NEEDED FOR NAUSEA OR VOMITING    Authorizing Provider: Janeece Agee    Ordering User: Chinita Pester    Date of patient request: 04/11/2020 Last office visit: 04/09/20 Date of last refill: 02/28/20 Last refill amount: 20 Follow up time period per chart: n/a

## 2020-04-11 NOTE — Telephone Encounter (Signed)
Patient is calling to request Refills on his medications- he was with the pharmacist yesterday and they were to fax a list of medications he needs refills on- he is very adamant about his nausea medication, headache medication and antidepressive medication. Please review medications that may need refills- like Fioricet. Tried to ask him if he understands he can order RF if he has Rf on the bottle- I am not sure he understands this- pharmacy may want to put him on automatic RF system if they have this available. He will be calling back this afternoon for follow up.
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
## 2020-04-15 ENCOUNTER — Ambulatory Visit: Payer: BC Managed Care – PPO | Admitting: Physical Therapy

## 2020-04-16 ENCOUNTER — Encounter: Payer: Self-pay | Admitting: Registered Nurse

## 2020-04-16 NOTE — Progress Notes (Signed)
Established Patient Office Visit  Subjective:  Patient ID: Shawn Edwards, male    DOB: 03-Apr-1981  Age: 39 y.o. MRN: [id number]  CC:  Chief Complaint  Patient presents with  . Follow-up    6 week follow up. PAtient still have some dizziness , headaches , fatigue. Patient would like to discuss medications that were not sent to the pharmacy for Smell, Tooth, Constipation and tasting.  Merleen Nicely Vaccine     patient would like to ask a few questions about vaccine     HPI [name] presents for follow up   Concern at this time that recurrent dental infection is turning into cyst. Having jaw tenderness and dental pain. No fever or chills, but notes that it is making it harder to eat.   He does express concern for chest tightness, but denies pain, palpitations, dizziness or lightheadedness above baseline, dependent edema, or further concern.  Ongoing constipation - hoping for rx to be called in for this. Interested in starting with MiraLax.  Needs further med refills - as ordered below  He feels that he is making concrete progress with therapy. We discussed deconditioning and readjustment to normal activities - as he has been largely sedentary for the better part of 14 months, it will take some time and work to regain strength.  Lastly we discussed the safety and efficacy of COVID-19 vaccine and encouraged him to pursue this at his pharmacy as we do not stock these vaccines in our clinic.   Past Medical History:  Diagnosis Date  . Concussion 02/04/2019  . Functional neurological symptom disorder with mixed symptoms   . Known health problems: none   . 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 AUDIOLOGICAL EVALUATION  02/28/2020      . NO PAST SURGERIES      No family history on file.  Social History   Socioeconomic History  . Marital status: Single    Spouse name: Not on file  . Number of children: 0  . Years of education: Not on  file  . Highest education level: Associate degree: academic program  Occupational History  . Not on file  Tobacco Use  . Smoking status: Never Smoker  . Smokeless tobacco: Never Used  Vaping Use  . Vaping Use: Never used  Substance and Sexual Activity  . Alcohol use: Never  . Drug use: Never  . Sexual activity: Not on file  Other Topics Concern  . Not on file  Social History Narrative   Lives alone   Right handed   Caffeine: sometimes soda   Social Determinants of Health   Financial Resource Strain:   . Difficulty of Paying Living Expenses: Not on file  Food Insecurity:   . Worried About Programme researcher, broadcasting/film/video in the Last Year: Not on file  . Ran Out of Food in the Last Year: Not on file  Transportation Needs:   . Lack of Transportation (Medical): Not on file  . Lack of Transportation (Non-Medical): Not on file  Physical Activity:   . Days of Exercise per Week: Not on file  . Minutes of Exercise per Session: Not on file  Stress:   . Feeling of Stress : Not on file  Social Connections:   . Frequency of Communication with Friends and Family: Not on file  . Frequency of Social Gatherings with Friends and Family: Not on file  . Attends Religious Services: Not on file  .  Active Member of Clubs or Organizations: Not on file  . Attends Banker Meetings: Not on file  . Marital Status: Not on file  Intimate Partner Violence:   . Fear of Current or Ex-Partner: Not on file  . Emotionally Abused: Not on file  . Physically Abused: Not on file  . Sexually Abused: Not on file    Outpatient Medications Prior to Visit  Medication Sig Dispense Refill  . buPROPion (WELLBUTRIN XL) 300 MG 24 hr tablet Take 1 tablet (300 mg total) by mouth daily. 90 tablet 1  . cyclobenzaprine (FLEXERIL) 5 MG tablet Take 2 tablets (10 mg total) by mouth 3 (three) times daily as needed for muscle spasms. 30 tablet 2  . ibuprofen (ADVIL) 600 MG tablet Take 1 tablet (600 mg total) by mouth every  6 (six) hours as needed. 30 tablet 2  . ketoconazole (NIZORAL) 2 % shampoo Apply 1 application topically 2 (two) times a week. 120 mL 0  . nortriptyline (PAMELOR) 50 MG capsule Take 2 capsules (100 mg total) by mouth at bedtime. For headache 30 capsule 1  . sertraline (ZOLOFT) 100 MG tablet Take 1 tablet (100 mg total) by mouth daily. 90 tablet 3  . terbinafine (LAMISIL) 250 MG tablet Take 1 tablet (250 mg total) by mouth daily. 84 tablet 0  . traZODone (DESYREL) 50 MG tablet Take 3 tablets (150 mg total) by mouth at bedtime. For sleep. 180 tablet 2  . ALPRAZolam (XANAX) 0.5 MG tablet Take 1 tablet (0.5 mg total) by mouth at bedtime as needed for anxiety. 60 tablet 0  . butalbital-acetaminophen-caffeine (FIORICET) 50-325-40 MG tablet Take 1-2 tablets by mouth every 6 (six) hours as needed for headache. 20 tablet 0  . Meclizine HCl 25 MG CHEW Chew 1 tablet (25 mg total) by mouth in the morning, at noon, and at bedtime. 90 tablet 3  . ondansetron (ZOFRAN) 4 MG tablet Take 1 tablet (4 mg total) by mouth every 8 (eight) hours as needed for nausea or vomiting. 20 tablet 0  . scopolamine (TRANSDERM-SCOP, 1.5 MG,) 1 MG/3DAYS Place 1 patch (1.5 mg total) onto the skin every 3 (three) days. 10 patch 12  . Vitamin D, Ergocalciferol, (DRISDOL) 1.25 MG (50000 UNIT) CAPS capsule Take 1 capsule (50,000 Units total) by mouth every 7 (seven) days. 8 capsule 0   No facility-administered medications prior to visit.    Allergies  Allergen Reactions  . Pork-Derived Products     ROS Review of Systems  Constitutional: Positive for fatigue.  HENT: Positive for dental problem.   Eyes: Negative.   Respiratory: Negative.   Cardiovascular: Negative.   Gastrointestinal: Positive for constipation.  Genitourinary: Negative.   Musculoskeletal: Negative.   Skin: Negative.   Neurological: Negative.   Psychiatric/Behavioral: Negative.       Objective:    Physical Exam Constitutional:      General: He is not  in acute distress.    Appearance: Normal appearance. He is normal weight. He is not ill-appearing, toxic-appearing or diaphoretic.  Cardiovascular:     Rate and Rhythm: Normal rate and regular rhythm.     Heart sounds: Normal heart sounds. No murmur heard.  No friction rub. No gallop.   Pulmonary:     Effort: Pulmonary effort is normal. No respiratory distress.     Breath sounds: Normal breath sounds. No stridor. No wheezing, rhonchi or rales.  Chest:     Chest wall: No tenderness.  Neurological:     General:  No focal deficit present.     Mental Status: He is alert and oriented to person, place, and time. Mental status is at baseline.  Psychiatric:        Mood and Affect: Mood normal.        Behavior: Behavior normal.        Thought Content: Thought content normal.        Judgment: Judgment normal.     BP 132/83   Pulse 84   Temp 97.9 F (36.6 C) (Temporal)   Resp 18   Ht 5\' 7"  (1.702 m)   SpO2 96%   BMI 23.65 kg/m  Wt Readings from Last 3 Encounters:  02/27/20 151 lb (68.5 kg)  02/16/20 151 lb (68.5 kg)  02/13/20 151 lb 14.4 oz (68.9 kg)     There are no preventive care reminders to display for this patient.  There are no preventive care reminders to display for this patient.  Lab Results  Component Value Date   TSH 2.190 09/18/2019   Lab Results  Component Value Date   WBC 5.6 04/09/2020   HGB 15.7 04/09/2020   HCT 45.5 04/09/2020   MCV 91 04/09/2020   PLT 216 09/18/2019   Lab Results  Component Value Date   NA 140 04/09/2020   K 3.9 04/09/2020   CO2 26 04/09/2020   GLUCOSE 98 04/09/2020   BUN 12 04/09/2020   CREATININE 0.75 (L) 04/09/2020   BILITOT 0.4 04/09/2020   ALKPHOS 59 04/09/2020   AST 15 04/09/2020   ALT 19 04/09/2020   PROT 7.4 04/09/2020   ALBUMIN 4.5 04/09/2020   CALCIUM 9.7 04/09/2020   ANIONGAP 11 02/05/2019   Lab Results  Component Value Date   CHOL 209 (H) 09/18/2019   Lab Results  Component Value Date   HDL 68 09/18/2019     Lab Results  Component Value Date   LDLCALC 117 (H) 09/18/2019   Lab Results  Component Value Date   TRIG 136 09/18/2019   Lab Results  Component Value Date   CHOLHDL 3.1 09/18/2019   Lab Results  Component Value Date   HGBA1C 5.3 09/18/2019      Assessment & Plan:   Problem List Items Addressed This Visit    None    Visit Diagnoses    Chronic idiopathic constipation    -  Primary   Relevant Medications   polyethylene glycol powder (GLYCOLAX/MIRALAX) 17 GM/SCOOP powder   Vitamin D deficiency       Relevant Medications   Vitamin D, Ergocalciferol, (DRISDOL) 1.25 MG (50000 UNIT) CAPS capsule   Sleep disturbance       Depression, unspecified depression type       Relevant Medications   ALPRAZolam (XANAX) 0.5 MG tablet   Dizziness       Relevant Medications   scopolamine (TRANSDERM-SCOP, 1.5 MG,) 1 MG/3DAYS   Meclizine HCl 25 MG CHEW   Seasonal allergic rhinitis, unspecified trigger       Relevant Medications   cetirizine (ZYRTEC) 10 MG tablet   fluticasone (FLONASE) 50 MCG/ACT nasal spray   Chest pain, unspecified type       Relevant Orders   EKG 12-Lead (Completed)   Odontogenic infection of jaw       Relevant Medications   amoxicillin-clavulanate (AUGMENTIN) 875-125 MG tablet   Other Relevant Orders   Ambulatory referral to Oral Maxillofacial Surgery   Nausea and vomiting, intractability of vomiting not specified, unspecified vomiting type  Relevant Orders   Ambulatory referral to Gastroenterology   Weakness       Relevant Orders   Vitamin D, 25-hydroxy (Completed)   Comprehensive metabolic panel (Completed)   CBC With Differential (Completed)   SAR CoV2 Serology (COVID 19)AB(IGG)IA (Completed)      Meds ordered this encounter  Medications  . Vitamin D, Ergocalciferol, (DRISDOL) 1.25 MG (50000 UNIT) CAPS capsule    Sig: Take 1 capsule (50,000 Units total) by mouth every 7 (seven) days.    Dispense:  8 capsule    Refill:  0    Instructions in  arabic please    Order Specific Question:   Supervising Provider    Answer:   Neva SeatGREENE, JEFFREY R [[id number]]  . scopolamine (TRANSDERM-SCOP, 1.5 MG,) 1 MG/3DAYS    Sig: Place 1 patch (1.5 mg total) onto the skin every 3 (three) days.    Dispense:  10 patch    Refill:  12    Order Specific Question:   Supervising Provider    Answer:   Neva SeatGREENE, JEFFREY R [[id number]]  . Meclizine HCl 25 MG CHEW    Sig: Chew 1 tablet (25 mg total) by mouth in the morning, at noon, and at bedtime.    Dispense:  90 tablet    Refill:  3    Order Specific Question:   Supervising Provider    Answer:   Neva SeatGREENE, JEFFREY R [[id number]]  . ALPRAZolam (XANAX) 0.5 MG tablet    Sig: Take 1 tablet (0.5 mg total) by mouth at bedtime as needed for anxiety.    Dispense:  60 tablet    Refill:  0    Instructions in arabic please    Order Specific Question:   Supervising Provider    Answer:   Neva SeatGREENE, JEFFREY R [[id number]]  . polyethylene glycol powder (GLYCOLAX/MIRALAX) 17 GM/SCOOP powder    Sig: Take 17 g by mouth 2 (two) times daily as needed.    Dispense:  3350 g    Refill:  1    Order Specific Question:   Supervising Provider    Answer:   Neva SeatGREENE, JEFFREY R [[id number]]  . cetirizine (ZYRTEC) 10 MG tablet    Sig: Take 1 tablet (10 mg total) by mouth daily.    Dispense:  30 tablet    Refill:  11    Order Specific Question:   Supervising Provider    Answer:   Neva SeatGREENE, JEFFREY R [[id number]]  . amoxicillin-clavulanate (AUGMENTIN) 875-125 MG tablet    Sig: Take 1 tablet by mouth 2 (two) times daily.    Dispense:  28 tablet    Refill:  0    Order Specific Question:   Supervising Provider    Answer:   Neva SeatGREENE, JEFFREY R [[id number]]  . fluticasone (FLONASE) 50 MCG/ACT nasal spray    Sig: Place 2 sprays into both nostrils daily.    Dispense:  16 g    Refill:  6    Order Specific Question:   Supervising Provider    Answer:   Neva SeatGREENE, JEFFREY R [[id number]]    Follow-up: No follow-ups on file.   PLAN  ekg wnl. No acute or chronic concerns. Compared to  01/16/19  Will draw labs and follow up   meds refilled as ordered above  I am concerned for infection in submandibular space. Will give course of augmentin and refer to maxillofacial surgeon for further work up   I spent 35 minutes with this patient, more than 50% of which was spent counseling/educating.  Patient encouraged to call clinic with any questions, comments, or concerns.   Janeece Agee, NP

## 2020-04-17 ENCOUNTER — Ambulatory Visit: Payer: BC Managed Care – PPO | Admitting: Physical Therapy

## 2020-04-22 ENCOUNTER — Encounter: Payer: Self-pay | Admitting: Registered Nurse

## 2020-04-22 ENCOUNTER — Ambulatory Visit: Payer: BC Managed Care – PPO | Admitting: Physical Therapy

## 2020-04-22 NOTE — Progress Notes (Signed)
Established Patient Office Visit  Subjective:  Patient ID: Leveon Pelzer, male    DOB: 08-06-1980  Age: 39 y.o. MRN: [id number]  CC:  Chief Complaint  Patient presents with  . Follow-up    Patient states he is doing a little better but he is still having Headaches, Neck pain, and some loss of memory. Patient would like to discusshow he thought he was okay walking better and lost balance at home and fell to the ground    HPI Vincen Bejar presents for routine follow up  Notes he had a dizzy spell at home and fell - did not hit head, no injuries. He is frustrated by this, though  He is having ongoing neck pain and continuing cognitive difficulties. Recent MRI head showed no acute abnormalities. Has been seen by neuro and ortho  Still working with PT. Feels he may be starting to see some progress.   Past Medical History:  Diagnosis Date  . Concussion 02/04/2019  . Functional neurological symptom disorder with mixed symptoms   . Known health problems: none   . 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 AUDIOLOGICAL EVALUATION  02/28/2020      . NO PAST SURGERIES      No family history on file.  Social History   Socioeconomic History  . Marital status: Single    Spouse name: Not on file  . Number of children: 0  . Years of education: Not on file  . Highest education level: Associate degree: academic program  Occupational History  . Not on file  Tobacco Use  . Smoking status: Never Smoker  . Smokeless tobacco: Never Used  Vaping Use  . Vaping Use: Never used  Substance and Sexual Activity  . Alcohol use: Never  . Drug use: Never  . Sexual activity: Not on file  Other Topics Concern  . Not on file  Social History Narrative   Lives alone   Right handed   Caffeine: sometimes soda   Social Determinants of Health   Financial Resource Strain:   . Difficulty of Paying Living Expenses: Not on file  Food Insecurity:   .  Worried About Programme researcher, broadcasting/film/video in the Last Year: Not on file  . Ran Out of Food in the Last Year: Not on file  Transportation Needs:   . Lack of Transportation (Medical): Not on file  . Lack of Transportation (Non-Medical): Not on file  Physical Activity:   . Days of Exercise per Week: Not on file  . Minutes of Exercise per Session: Not on file  Stress:   . Feeling of Stress : Not on file  Social Connections:   . Frequency of Communication with Friends and Family: Not on file  . Frequency of Social Gatherings with Friends and Family: Not on file  . Attends Religious Services: Not on file  . Active Member of Clubs or Organizations: Not on file  . Attends Banker Meetings: Not on file  . Marital Status: Not on file  Intimate Partner Violence:   . Fear of Current or Ex-Partner: Not on file  . Emotionally Abused: Not on file  . Physically Abused: Not on file  . Sexually Abused: Not on file    Outpatient Medications Prior to Visit  Medication Sig Dispense Refill  . ibuprofen (ADVIL) 600 MG tablet Take 1 tablet (600 mg total) by mouth every 6 (six) hours as needed. 30 tablet 2  .  ketoconazole (NIZORAL) 2 % shampoo Apply 1 application topically 2 (two) times a week. 120 mL 0  . terbinafine (LAMISIL) 250 MG tablet Take 1 tablet (250 mg total) by mouth daily. 84 tablet 0  . ALPRAZolam (XANAX) 0.5 MG tablet Take 1 tablet (0.5 mg total) by mouth at bedtime as needed for anxiety. 60 tablet 0  . buPROPion (WELLBUTRIN XL) 300 MG 24 hr tablet Take 1 tablet (300 mg total) by mouth daily. 90 tablet 1  . butalbital-acetaminophen-caffeine (FIORICET) 50-325-40 MG tablet Take 1-2 tablets by mouth every 6 (six) hours as needed for headache. 20 tablet 0  . cyclobenzaprine (FLEXERIL) 5 MG tablet Take 2 tablets (10 mg total) by mouth 3 (three) times daily as needed for muscle spasms. 30 tablet 2  . Meclizine HCl 25 MG CHEW Chew 1 tablet (25 mg total) by mouth in the morning, at noon, and at  bedtime. 90 tablet 3  . nortriptyline (PAMELOR) 50 MG capsule Take 2 capsules (100 mg total) by mouth at bedtime. For headache 30 capsule 1  . ondansetron (ZOFRAN) 4 MG tablet Take 1 tablet (4 mg total) by mouth every 8 (eight) hours as needed for nausea or vomiting. 20 tablet 0  . scopolamine (TRANSDERM-SCOP, 1.5 MG,) 1 MG/3DAYS Place 1 patch (1.5 mg total) onto the skin every 3 (three) days. 10 patch 12  . sertraline (ZOLOFT) 100 MG tablet Take 1 tablet (100 mg total) by mouth daily. 90 tablet 3  . traZODone (DESYREL) 50 MG tablet Take 1 tablet (50 mg total) by mouth at bedtime. For sleep. 30 tablet 1   No facility-administered medications prior to visit.    Allergies  Allergen Reactions  . Pork-Derived Products     ROS Review of Systems  Constitutional: Negative.   HENT: Negative.   Eyes: Negative.   Respiratory: Negative.   Cardiovascular: Negative.   Gastrointestinal: Negative.   Genitourinary: Negative.   Musculoskeletal: Positive for neck pain.  Skin: Negative.   Neurological: Positive for headaches.  Psychiatric/Behavioral: Positive for confusion. The patient is nervous/anxious.       Objective:    Physical Exam Constitutional:      General: He is not in acute distress.    Appearance: Normal appearance. He is normal weight. He is not ill-appearing, toxic-appearing or diaphoretic.  Cardiovascular:     Rate and Rhythm: Normal rate and regular rhythm.     Heart sounds: Normal heart sounds. No murmur heard.  No friction rub. No gallop.   Pulmonary:     Effort: Pulmonary effort is normal. No respiratory distress.     Breath sounds: Normal breath sounds. No stridor. No wheezing, rhonchi or rales.  Chest:     Chest wall: No tenderness.  Neurological:     General: No focal deficit present.     Mental Status: He is alert and oriented to person, place, and time. Mental status is at baseline.  Psychiatric:        Mood and Affect: Mood normal.        Behavior: Behavior  normal.        Thought Content: Thought content normal.        Judgment: Judgment normal.     BP 125/82   Pulse 80   Temp 97.9 F (36.6 C) (Temporal)   Resp 17   Ht 5\' 7"  (1.702 m)   Wt 150 lb (68 kg)   SpO2 97%   BMI 23.49 kg/m  Wt Readings from Last 3 Encounters:  02/27/20 151 lb (68.5 kg)  02/16/20 151 lb (68.5 kg)  02/13/20 151 lb 14.4 oz (68.9 kg)     There are no preventive care reminders to display for this patient.  There are no preventive care reminders to display for this patient.  Lab Results  Component Value Date   TSH 2.190 09/18/2019   Lab Results  Component Value Date   WBC 5.6 04/09/2020   HGB 15.7 04/09/2020   HCT 45.5 04/09/2020   MCV 91 04/09/2020   PLT 216 09/18/2019   Lab Results  Component Value Date   NA 140 04/09/2020   K 3.9 04/09/2020   CO2 26 04/09/2020   GLUCOSE 98 04/09/2020   BUN 12 04/09/2020   CREATININE 0.75 (L) 04/09/2020   BILITOT 0.4 04/09/2020   ALKPHOS 59 04/09/2020   AST 15 04/09/2020   ALT 19 04/09/2020   PROT 7.4 04/09/2020   ALBUMIN 4.5 04/09/2020   CALCIUM 9.7 04/09/2020   ANIONGAP 11 02/05/2019   Lab Results  Component Value Date   CHOL 209 (H) 09/18/2019   Lab Results  Component Value Date   HDL 68 09/18/2019   Lab Results  Component Value Date   LDLCALC 117 (H) 09/18/2019   Lab Results  Component Value Date   TRIG 136 09/18/2019   Lab Results  Component Value Date   CHOLHDL 3.1 09/18/2019   Lab Results  Component Value Date   HGBA1C 5.3 09/18/2019      Assessment & Plan:   Problem List Items Addressed This Visit    None    Visit Diagnoses    Infected tooth    -  Primary   Depression, unspecified depression type       Relevant Medications   traZODone (DESYREL) 50 MG tablet   Chronic post-traumatic headache, not intractable       Relevant Medications   traZODone (DESYREL) 50 MG tablet   Muscle spasm       Dizziness       Non-intractable vomiting with nausea, unspecified  vomiting type       Sleep disturbance       Relevant Medications   traZODone (DESYREL) 50 MG tablet   Screening for viral disease       Relevant Orders   HIV antibody (with reflex) (Completed)   Hepatitis C Antibody (Completed)   Other chronic pain       Relevant Medications   traZODone (DESYREL) 50 MG tablet   Other Relevant Orders   Vitamin D, 25-hydroxy (Completed)   Vitamin B12 (Completed)   Magnesium (Completed)      Meds ordered this encounter  Medications  . DISCONTD: amoxicillin-clavulanate (AUGMENTIN) 875-125 MG tablet    Sig: Take 1 tablet by mouth 2 (two) times daily.    Dispense:  20 tablet    Refill:  0    Order Specific Question:   Supervising Provider    AnswerLeretha Pol, Meda Coffee [[id number]]  . DISCONTD: ALPRAZolam (XANAX) 0.5 MG tablet    Sig: Take 1 tablet (0.5 mg total) by mouth at bedtime as needed for anxiety.    Dispense:  60 tablet    Refill:  0    Order Specific Question:   Supervising Provider    AnswerMyles Lipps [[id number]]  . DISCONTD: buPROPion (WELLBUTRIN XL) 300 MG 24 hr tablet    Sig: Take 1 tablet (300 mg total) by mouth daily.    Dispense:  90 tablet  Refill:  1    Order Specific Question:   Supervising Provider    AnswerLeretha Pol:   SANTIAGO, Meda CoffeeIRMA M [[id number]]  . DISCONTD: butalbital-acetaminophen-caffeine (FIORICET) 50-325-40 MG tablet    Sig: Take 1-2 tablets by mouth every 6 (six) hours as needed for headache.    Dispense:  20 tablet    Refill:  0    Order Specific Question:   Supervising Provider    AnswerLeretha Pol:   SANTIAGO, Meda CoffeeIRMA M [[id number]]  . DISCONTD: cyclobenzaprine (FLEXERIL) 5 MG tablet    Sig: Take 2 tablets (10 mg total) by mouth 3 (three) times daily as needed for muscle spasms.    Dispense:  30 tablet    Refill:  2    Order Specific Question:   Supervising Provider    AnswerLeretha Pol:   SANTIAGO, Meda CoffeeIRMA M [[id number]]  . DISCONTD: Meclizine HCl 25 MG CHEW    Sig: Chew 1 tablet (25 mg total) by mouth in the morning, at noon, and at bedtime.      Dispense:  90 tablet    Refill:  3    Order Specific Question:   Supervising Provider    AnswerLeretha Pol:   SANTIAGO, Meda CoffeeIRMA M [[id number]]  . DISCONTD: nortriptyline (PAMELOR) 50 MG capsule    Sig: Take 2 capsules (100 mg total) by mouth at bedtime. For headache    Dispense:  30 capsule    Refill:  1    Arabic. Increased to 100mg  from 75mg     Order Specific Question:   Supervising Provider    Answer:   Myles LippsSANTIAGO, IRMA M [[id number]]  . DISCONTD: ondansetron (ZOFRAN) 4 MG tablet    Sig: Take 1 tablet (4 mg total) by mouth every 8 (eight) hours as needed for nausea or vomiting.    Dispense:  20 tablet    Refill:  0    Order Specific Question:   Supervising Provider    AnswerLeretha Pol:   SANTIAGO, Meda CoffeeIRMA M [[id number]]  . DISCONTD: scopolamine (TRANSDERM-SCOP, 1.5 MG,) 1 MG/3DAYS    Sig: Place 1 patch (1.5 mg total) onto the skin every 3 (three) days.    Dispense:  10 patch    Refill:  12    Order Specific Question:   Supervising Provider    AnswerLeretha Pol:   SANTIAGO, Meda CoffeeIRMA M [[id number]][[id number]]  . DISCONTD: sertraline (ZOLOFT) 100 MG tablet    Sig: Take 1 tablet (100 mg total) by mouth daily.    Dispense:  90 tablet    Refill:  3    Order Specific Question:   Supervising Provider    AnswerLeretha Pol:   SANTIAGO, Meda CoffeeIRMA M [[id number]]  . traZODone (DESYREL) 50 MG tablet    Sig: Take 3 tablets (150 mg total) by mouth at bedtime. For sleep.    Dispense:  180 tablet    Refill:  2    Arabic please    Order Specific Question:   Supervising Provider    Answer:   Myles LippsSANTIAGO, IRMA M [[id number]]    Follow-up: No follow-ups on file.   PLAN  Med refills as ordered. Reviewed med compliance and provided with up to date medication list.  Optimistic that meclizine and scopalamine patches will provide some relief from dizziness, make PT more bearable  Magnesium, vit d, and b12 labs drawn to investigate causes for some peripheral symptoms like weakness, will follow up as warranted  Patient encouraged to call clinic with any questions, comments, or  concerns.  Janeece Ageeichard Sreya Froio, NP

## 2020-04-24 ENCOUNTER — Ambulatory Visit: Payer: BC Managed Care – PPO | Admitting: Physical Therapy

## 2020-04-26 ENCOUNTER — Ambulatory Visit: Payer: BC Managed Care – PPO | Admitting: Physical Therapy

## 2020-05-07 ENCOUNTER — Ambulatory Visit: Payer: BC Managed Care – PPO | Admitting: Registered Nurse

## 2020-05-08 ENCOUNTER — Ambulatory Visit: Payer: BC Managed Care – PPO | Admitting: Family Medicine

## 2020-05-10 ENCOUNTER — Other Ambulatory Visit: Payer: Self-pay | Admitting: *Deleted

## 2020-05-10 DIAGNOSIS — R112 Nausea with vomiting, unspecified: Secondary | ICD-10-CM

## 2020-05-10 DIAGNOSIS — R42 Dizziness and giddiness: Secondary | ICD-10-CM

## 2020-05-10 DIAGNOSIS — G44329 Chronic post-traumatic headache, not intractable: Secondary | ICD-10-CM

## 2020-05-10 DIAGNOSIS — B351 Tinea unguium: Secondary | ICD-10-CM

## 2020-05-10 DIAGNOSIS — G479 Sleep disorder, unspecified: Secondary | ICD-10-CM

## 2020-05-10 MED ORDER — IBUPROFEN 600 MG PO TABS
600.0000 mg | ORAL_TABLET | Freq: Four times a day (QID) | ORAL | 2 refills | Status: DC | PRN
Start: 2020-05-10 — End: 2020-05-14

## 2020-05-10 NOTE — Telephone Encounter (Signed)
Patient is calling to review his medications and get RF- reviewed medication list with patient- listed the medications that have refills that he can call the pharmacy for and the medications that do not. Patient would like to request RF of medications without RF: ibuprofen, nortriptyline, ondansetron, trazodone. Patient questioned if he needs terbinafine- this is normally short term use.
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
## 2020-05-14 ENCOUNTER — Other Ambulatory Visit: Payer: Self-pay | Admitting: Registered Nurse

## 2020-05-14 DIAGNOSIS — L210 Seborrhea capitis: Secondary | ICD-10-CM

## 2020-05-14 MED ORDER — ONDANSETRON HCL 4 MG PO TABS: ORAL_TABLET | ORAL | 0 refills | Status: DC

## 2020-05-14 MED ORDER — NORTRIPTYLINE HCL 50 MG PO CAPS: 100.0000 mg | ORAL_CAPSULE | Freq: Every day | ORAL | 1 refills | Status: DC

## 2020-05-14 MED ORDER — TERBINAFINE HCL 250 MG PO TABS: 250.0000 mg | ORAL_TABLET | Freq: Every day | ORAL | 0 refills | Status: DC

## 2020-05-14 MED ORDER — TRAZODONE HCL 50 MG PO TABS: 150.0000 mg | ORAL_TABLET | Freq: Every day | ORAL | 0 refills | Status: DC

## 2020-05-14 NOTE — Telephone Encounter (Signed)
Rich when I try to send these meds in I get a warning message can you take a look at this to be sure it is ok to fill

## 2020-05-15 ENCOUNTER — Ambulatory Visit (INDEPENDENT_AMBULATORY_CARE_PROVIDER_SITE_OTHER): Payer: BC Managed Care – PPO | Admitting: Registered Nurse

## 2020-05-15 ENCOUNTER — Encounter: Payer: Self-pay | Admitting: Registered Nurse

## 2020-05-15 ENCOUNTER — Other Ambulatory Visit: Payer: Self-pay

## 2020-05-15 VITALS — BP 127/87 | HR 86 | Temp 97.8°F | Resp 18 | Ht 67.0 in

## 2020-05-15 DIAGNOSIS — F447 Conversion disorder with mixed symptom presentation: Secondary | ICD-10-CM | POA: Diagnosis not present

## 2020-05-15 NOTE — Patient Instructions (Signed)
° ° ° °  If you have lab work done today you will be contacted with your lab results within the next 2 weeks.  If you have not heard from us then please contact us. The fastest way to get your results is to register for My Chart. ° ° °IF you received an x-ray today, you will receive an invoice from [facility]. Please contact [facility] at [phone] with questions or concerns regarding your invoice.  ° °IF you received labwork today, you will receive an invoice from [facility]. Please contact [facility] at [phone] with questions or concerns regarding your invoice.  ° °Our billing staff will not be able to assist you with questions regarding bills from these companies. ° °You will be contacted with the lab results as soon as they are available. The fastest way to get your results is to activate your My Chart account. Instructions are located on the last page of this paperwork. If you have not heard from us regarding the results in 2 weeks, please contact this office. °  ° ° ° °

## 2020-05-16 ENCOUNTER — Encounter: Payer: Self-pay | Admitting: Family Medicine

## 2020-05-16 ENCOUNTER — Ambulatory Visit: Payer: BC Managed Care – PPO | Admitting: Family Medicine

## 2020-05-16 VITALS — BP 130/86 | HR 82 | Ht 67.0 in | Wt 151.0 lb

## 2020-05-16 DIAGNOSIS — F447 Conversion disorder with mixed symptom presentation: Secondary | ICD-10-CM

## 2020-05-16 DIAGNOSIS — H8190 Unspecified disorder of vestibular function, unspecified ear: Secondary | ICD-10-CM

## 2020-05-16 DIAGNOSIS — F0781 Postconcussional syndrome: Secondary | ICD-10-CM | POA: Diagnosis not present
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
## 2020-05-16 NOTE — Patient Instructions (Addendum)
Thank you for coming in today.  Recheck in 3 months.   Request Interpreter Barrie Lyme

## 2020-05-24 ENCOUNTER — Encounter: Payer: Self-pay | Admitting: Physician Assistant

## 2020-06-04 ENCOUNTER — Ambulatory Visit: Payer: BC Managed Care – PPO | Admitting: Registered Nurse

## 2020-06-13 ENCOUNTER — Other Ambulatory Visit: Payer: Self-pay | Admitting: Registered Nurse

## 2020-06-13 DIAGNOSIS — G479 Sleep disorder, unspecified: Secondary | ICD-10-CM
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
## 2020-06-14 ENCOUNTER — Ambulatory Visit: Payer: BC Managed Care – PPO | Admitting: Registered Nurse

## 2020-06-14 ENCOUNTER — Ambulatory Visit: Payer: BC Managed Care – PPO | Admitting: Physician Assistant

## 2020-07-05 ENCOUNTER — Ambulatory Visit: Payer: BC Managed Care – PPO | Admitting: Registered Nurse

## 2020-07-26 ENCOUNTER — Other Ambulatory Visit: Payer: Self-pay | Admitting: Registered Nurse

## 2020-07-26 DIAGNOSIS — G479 Sleep disorder, unspecified: Secondary | ICD-10-CM

## 2020-07-26 DIAGNOSIS — R112 Nausea with vomiting, unspecified: Secondary | ICD-10-CM

## 2020-07-26 DIAGNOSIS — G44329 Chronic post-traumatic headache, not intractable: Secondary | ICD-10-CM

## 2020-07-26 DIAGNOSIS — L210 Seborrhea capitis: Secondary | ICD-10-CM
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
## 2020-07-29 NOTE — Telephone Encounter (Signed)
Patient is requesting a refill of the following medications: Requested Prescriptions   Pending Prescriptions Disp Refills  . butalbital-acetaminophen-caffeine (FIORICET) 50-325-40 MG tablet [Pharmacy Med Name: BUTALBITAL/ACETAMINOPHEN/CAFF TABS] 20 tablet     Sig: TAKE 1 TO 2 TABLETS BY MOUTH EVERY 6 HOURS AS NEEDED FOR HEADACHE  . ketoconazole (NIZORAL) 2 % shampoo [Pharmacy Med Name: KETOCONAZOLE 2% SHAMPOO 120ML] 120 mL 0    Sig: APPLY TOPICALLY 2 TIMES A WEEK  . ondansetron (ZOFRAN) 4 MG tablet [Pharmacy Med Name: ONDANSETRON 4MG  TABLETS] 20 tablet 0    Sig: TAKE 1 TABLET(4 MG) BY MOUTH EVERY 8 HOURS AS NEEDED FOR NAUSEA OR VOMITING  . traZODone (DESYREL) 50 MG tablet [Pharmacy Med Name: TRAZODONE 50MG  TABLETS] 90 tablet 0    Sig: TAKE 3 TABLETS(150 MG) BY MOUTH AT BEDTIME FOR SLEEP    Date of patient request:07/26/2020 Last office visit: 05/15/2020 Date of last refill:04/12/2020 Last refill amount: 20 tablets 1 refill Follow up time period per chart: 08/09/2020

## 2020-08-01 ENCOUNTER — Encounter: Payer: Self-pay | Admitting: Registered Nurse

## 2020-08-01 NOTE — Progress Notes (Signed)
Established Patient Office Visit  Subjective:  Patient ID: Shawn Edwards, male    DOB: [birth year]  Age: 40 y.o. MRN: [id number]  CC:  Chief Complaint  Patient presents with  . Follow-up    PAtient states he still have an headache that he feels is making it hard for him to Hear and see. Per patient he is also constipated for a few days.    HPI Shawn Edwards presents for follow up   Feeling at this time that his headache is giving him the worst symptoms and is the biggest contributor to his depression Also giving sensitivity to sound and photophobia Notes that he is having an easier time with medication compliance Unsure of the effect these are having  Discussed neuropsych testing and meeting with psychiatry, we will continue to push for these visits to happen  Has been seen by Dr. Georgina Snell who ordered MRI, no abnormalities, no clear cause for ongoing symptoms.  Past Medical History:  Diagnosis Date  . Concussion 02/04/2019  . Functional neurological symptom disorder with mixed symptoms   . Known health problems: none   . 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 AUDIOLOGICAL EVALUATION  02/28/2020      . NO PAST SURGERIES      No family history on file.  Social History   Socioeconomic History  . Marital status: Single    Spouse name: Not on file  . Number of children: 0  . Years of education: Not on file  . Highest education level: Associate degree: academic program  Occupational History  . Not on file  Tobacco Use  . Smoking status: Never Smoker  . Smokeless tobacco: Never Used  Vaping Use  . Vaping Use: Never used  Substance and Sexual Activity  . Alcohol use: Never  . Drug use: Never  . Sexual activity: Not on file  Other Topics Concern  . Not on file  Social History Narrative   Lives alone   Right handed   Caffeine: sometimes soda   Social Determinants of Health   Financial Resource Strain: Not on file   Food Insecurity: Not on file  Transportation Needs: Not on file  Physical Activity: Not on file  Stress: Not on file  Social Connections: Not on file  Intimate Partner Violence: Not on file    Outpatient Medications Prior to Visit  Medication Sig Dispense Refill  . ALPRAZolam (XANAX) 0.5 MG tablet Take 1 tablet (0.5 mg total) by mouth at bedtime as needed for anxiety. 60 tablet 0  . buPROPion (WELLBUTRIN XL) 300 MG 24 hr tablet Take 1 tablet (300 mg total) by mouth daily. 90 tablet 1  . cetirizine (ZYRTEC) 10 MG tablet Take 1 tablet (10 mg total) by mouth daily. 30 tablet 11  . cyclobenzaprine (FLEXERIL) 5 MG tablet Take 2 tablets (10 mg total) by mouth 3 (three) times daily as needed for muscle spasms. 30 tablet 2  . fluticasone (FLONASE) 50 MCG/ACT nasal spray Place 2 sprays into both nostrils daily. 16 g 6  . ibuprofen (ADVIL) 600 MG tablet TAKE 1 TABLET(600 MG) BY MOUTH EVERY 6 HOURS AS NEEDED 30 tablet 2  . Meclizine HCl 25 MG CHEW Chew 1 tablet (25 mg total) by mouth in the morning, at noon, and at bedtime. 90 tablet 3  . nortriptyline (PAMELOR) 50 MG capsule Take 2 capsules (100 mg total) by mouth at bedtime. For headache 30 capsule 1  . polyethylene glycol  powder (GLYCOLAX/MIRALAX) 17 GM/SCOOP powder Take 17 g by mouth 2 (two) times daily as needed. 3350 g 1  . scopolamine (TRANSDERM-SCOP, 1.5 MG,) 1 MG/3DAYS Place 1 patch (1.5 mg total) onto the skin every 3 (three) days. 10 patch 12  . sertraline (ZOLOFT) 100 MG tablet Take 1 tablet (100 mg total) by mouth daily. 90 tablet 3  . terbinafine (LAMISIL) 250 MG tablet Take 1 tablet (250 mg total) by mouth daily. 84 tablet 0  . Vitamin D, Ergocalciferol, (DRISDOL) 1.25 MG (50000 UNIT) CAPS capsule Take 1 capsule (50,000 Units total) by mouth every 7 (seven) days. 8 capsule 0  . butalbital-acetaminophen-caffeine (FIORICET) 50-325-40 MG tablet TAKE 1 TO 2 TABLETS BY MOUTH EVERY 6 HOURS AS NEEDED FOR HEADACHE 20 tablet 1  . ketoconazole  (NIZORAL) 2 % shampoo APPLY TOPICALLY 2 TIMES A WEEK 120 mL 0  . ondansetron (ZOFRAN) 4 MG tablet TAKE 1 TABLET(4 MG) BY MOUTH EVERY 8 HOURS AS NEEDED FOR NAUSEA OR VOMITING 20 tablet 0  . traZODone (DESYREL) 50 MG tablet Take 3 tablets (150 mg total) by mouth at bedtime. For sleep. 90 tablet 0  . amoxicillin-clavulanate (AUGMENTIN) 875-125 MG tablet Take 1 tablet by mouth 2 (two) times daily. (Patient not taking: Reported on 05/15/2020) 28 tablet 0   No facility-administered medications prior to visit.    Allergies  Allergen Reactions  . Pork-Derived Products     ROS Review of Systems  Constitutional: Positive for fatigue.  Eyes: Positive for photophobia and visual disturbance.  Gastrointestinal: Positive for constipation.  Musculoskeletal: Positive for arthralgias and back pain.  Skin: Negative.   Psychiatric/Behavioral: Positive for dysphoric mood and sleep disturbance. The patient is nervous/anxious.       Objective:    Physical Exam Constitutional:      General: He is not in acute distress.    Appearance: Normal appearance. He is normal weight. He is not ill-appearing, toxic-appearing or diaphoretic.  Eyes:     General: No scleral icterus.       Right eye: No discharge.        Left eye: No discharge.     Extraocular Movements: Extraocular movements intact.     Conjunctiva/sclera: Conjunctivae normal.     Pupils: Pupils are equal, round, and reactive to light.  Cardiovascular:     Rate and Rhythm: Normal rate and regular rhythm.     Heart sounds: Normal heart sounds. No murmur heard. No friction rub. No gallop.   Pulmonary:     Effort: Pulmonary effort is normal. No respiratory distress.     Breath sounds: Normal breath sounds. No stridor. No wheezing, rhonchi or rales.  Chest:     Chest wall: No tenderness.  Skin:    Capillary Refill: Capillary refill takes less than 2 seconds.  Neurological:     General: No focal deficit present.     Mental Status: He is alert  and oriented to person, place, and time. Mental status is at baseline.     Cranial Nerves: No cranial nerve deficit.     Sensory: No sensory deficit.     Motor: No weakness.     Coordination: Coordination normal.     Gait: Gait normal.     Deep Tendon Reflexes: Reflexes normal.  Psychiatric:        Mood and Affect: Mood normal.        Behavior: Behavior normal.        Thought Content: Thought content normal.  Judgment: Judgment normal.     BP 127/87   Pulse 86   Temp 97.8 F (36.6 C) (Temporal)   Resp 18   Ht 5\' 7"  (1.702 m)   SpO2 97%   BMI 23.65 kg/m  Wt Readings from Last 3 Encounters:  05/16/20 151 lb (68.5 kg)  02/27/20 151 lb (68.5 kg)  02/16/20 151 lb (68.5 kg)     There are no preventive care reminders to display for this patient.  There are no preventive care reminders to display for this patient.  Lab Results  Component Value Date   TSH 2.190 09/18/2019   Lab Results  Component Value Date   WBC 5.6 04/09/2020   HGB 15.7 04/09/2020   HCT 45.5 04/09/2020   MCV 91 04/09/2020   PLT 216 09/18/2019   Lab Results  Component Value Date   NA 140 04/09/2020   K 3.9 04/09/2020   CO2 26 04/09/2020   GLUCOSE 98 04/09/2020   BUN 12 04/09/2020   CREATININE 0.75 (L) 04/09/2020   BILITOT 0.4 04/09/2020   ALKPHOS 59 04/09/2020   AST 15 04/09/2020   ALT 19 04/09/2020   PROT 7.4 04/09/2020   ALBUMIN 4.5 04/09/2020   CALCIUM 9.7 04/09/2020   ANIONGAP 11 02/05/2019   Lab Results  Component Value Date   CHOL 209 (H) 09/18/2019   Lab Results  Component Value Date   HDL 68 09/18/2019   Lab Results  Component Value Date   LDLCALC 117 (H) 09/18/2019   Lab Results  Component Value Date   TRIG 136 09/18/2019   Lab Results  Component Value Date   CHOLHDL 3.1 09/18/2019   Lab Results  Component Value Date   HGBA1C 5.3 09/18/2019      Assessment & Plan:   Problem List Items Addressed This Visit      Other   Functional neurological symptom  disorder with mixed symptoms - Primary      No orders of the defined types were placed in this encounter.   Follow-up: No follow-ups on file.   PLAN  Discussed increasing water intake, more activity, and use of miralax once daily to combat constipation  Will continue to push for neuropsych and psychiatry referrals  Patient encouraged to call clinic with any questions, comments, or concerns.  I spent 44 minutes with this patient, more than 50% of which was spent counseling and/or educating.   09/20/2019, NP

## 2020-08-09 ENCOUNTER — Ambulatory Visit: Payer: BC Managed Care – PPO | Admitting: Registered Nurse

## 2020-08-14 ENCOUNTER — Ambulatory Visit: Payer: BC Managed Care – PPO | Admitting: Registered Nurse
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
## 2020-08-15 ENCOUNTER — Telehealth: Payer: Self-pay | Admitting: Registered Nurse

## 2020-08-15 ENCOUNTER — Ambulatory Visit: Payer: BC Managed Care – PPO | Admitting: Family Medicine

## 2020-08-15 NOTE — Telephone Encounter (Signed)
PATIEN CALLED TO SAY HE SENT A MESSAGE  (EMAIL ) MAY BE MYCHART MESSAGE TO [facility]  WANTS TO MAKE SURE HE CHECKS IT . ALSO SCHEDULED APPT FOR FOLLOW UP.

## 2020-08-16 ENCOUNTER — Ambulatory Visit: Payer: BC Managed Care – PPO | Admitting: Gastroenterology

## 2020-08-16 NOTE — Telephone Encounter (Signed)
Pt called about this but I do not see a MyChart message from him. Do I need to call and get more info or are you aware?

## 2020-08-30 ENCOUNTER — Other Ambulatory Visit: Payer: Self-pay

## 2020-08-30 ENCOUNTER — Encounter: Payer: Self-pay | Admitting: Registered Nurse

## 2020-08-30 ENCOUNTER — Ambulatory Visit (INDEPENDENT_AMBULATORY_CARE_PROVIDER_SITE_OTHER): Payer: BC Managed Care – PPO | Admitting: Registered Nurse

## 2020-08-30 VITALS — BP 125/82 | HR 70 | Temp 98.0°F | Resp 18 | Ht 67.0 in

## 2020-08-30 DIAGNOSIS — R112 Nausea with vomiting, unspecified: Secondary | ICD-10-CM

## 2020-08-30 DIAGNOSIS — E559 Vitamin D deficiency, unspecified: Secondary | ICD-10-CM

## 2020-08-30 DIAGNOSIS — F447 Conversion disorder with mixed symptom presentation: Secondary | ICD-10-CM | POA: Diagnosis not present

## 2020-08-30 DIAGNOSIS — G479 Sleep disorder, unspecified: Secondary | ICD-10-CM

## 2020-08-30 DIAGNOSIS — L210 Seborrhea capitis: Secondary | ICD-10-CM

## 2020-08-30 DIAGNOSIS — R42 Dizziness and giddiness: Secondary | ICD-10-CM | POA: Diagnosis not present

## 2020-08-30 DIAGNOSIS — M62838 Other muscle spasm: Secondary | ICD-10-CM

## 2020-08-30 DIAGNOSIS — J302 Other seasonal allergic rhinitis: Secondary | ICD-10-CM

## 2020-08-30 DIAGNOSIS — F32A Depression, unspecified: Secondary | ICD-10-CM

## 2020-08-30 DIAGNOSIS — G44329 Chronic post-traumatic headache, not intractable: Secondary | ICD-10-CM

## 2020-08-30 MED ORDER — VITAMIN D (ERGOCALCIFEROL) 1.25 MG (50000 UNIT) PO CAPS: 50000.0000 [IU] | ORAL_CAPSULE | ORAL | 0 refills | Status: DC

## 2020-08-30 MED ORDER — ONDANSETRON HCL 4 MG PO TABS
ORAL_TABLET | ORAL | 0 refills | Status: DC
Start: 2020-08-30 — End: 2020-10-08

## 2020-08-30 MED ORDER — MECLIZINE HCL 25 MG PO CHEW: 25.0000 mg | CHEWABLE_TABLET | Freq: Three times a day (TID) | ORAL | 3 refills | Status: DC

## 2020-08-30 MED ORDER — KETOCONAZOLE 2 % EX SHAM: MEDICATED_SHAMPOO | CUTANEOUS | 0 refills | Status: DC

## 2020-08-30 MED ORDER — NORTRIPTYLINE HCL 50 MG PO CAPS
100.0000 mg | ORAL_CAPSULE | Freq: Every day | ORAL | 1 refills | Status: DC
Start: 2020-08-30 — End: 2021-02-19

## 2020-08-30 MED ORDER — ALPRAZOLAM 0.5 MG PO TABS: 0.5000 mg | ORAL_TABLET | Freq: Every evening | ORAL | 0 refills | Status: DC | PRN

## 2020-08-30 MED ORDER — FLUTICASONE PROPIONATE 50 MCG/ACT NA SUSP
2.0000 | Freq: Every day | NASAL | 6 refills | Status: DC
Start: 2020-08-30 — End: 2020-12-02

## 2020-08-30 MED ORDER — CYCLOBENZAPRINE HCL 5 MG PO TABS: 10.0000 mg | ORAL_TABLET | Freq: Three times a day (TID) | ORAL | 2 refills | Status: DC | PRN

## 2020-08-30 MED ORDER — CETIRIZINE HCL 10 MG PO TABS: 10.0000 mg | ORAL_TABLET | Freq: Every day | ORAL | 11 refills | Status: DC

## 2020-08-30 MED ORDER — SCOPOLAMINE 1 MG/3DAYS TD PT72: 1.0000 | MEDICATED_PATCH | TRANSDERMAL | 12 refills | Status: DC

## 2020-08-30 MED ORDER — BUPROPION HCL ER (XL) 300 MG PO TB24: 300.0000 mg | ORAL_TABLET | Freq: Every day | ORAL | 1 refills | Status: DC

## 2020-08-30 NOTE — Patient Instructions (Addendum)
Alprazolam: Take 1 tablet (0.5mg ) by mouth at bedtime as needed for anxiety.  Bupropion (Wellbutrin): Take 1 tablet (300mg  XL) by mouth every morning.  Cetirizine (Zyrtec): Take 1 tablet (10mg ) by mouth every morning.  Cyclobenzaprine (Flexeril): Take 1-2 tablets (5-10mg ) by mouth three times daily as needed for muscle spasms.  Ergocalciferol (Vitamin D): Take 1 capsule (50,000 units) one time each week.  Fluticasone (Flonase): use 1 spray in each nostril daily.  Ketoconazole (Nizoral): Shampoo into hair two times each week.  Nortriptyline (Pamelor): Take 2 capsules (100mg  total) by mouth at bedtime each evening.  Ondansetron (Zofran): Take 1 tablet (4mg ) every 8 hours as needed for nausea.  Scopolamine Base (Transderm-SCOP): Place one patch behind ear every three days.     Alprazolam:    (0.5 )         .    ():    (300  XL)     .    ():    (10 )     .    ():  1-2  (5-10 )          .   Ergocalciferol ( ):    (50000 )    .   Fluticasone (Flonase):        .    ():      .     ():   (100  )        .    Ondansetron (Zofran):    (4 )  8    .      (- ):                 If you have lab work done today you will be contacted with your lab results within the next 2 weeks.  If you have not heard from then please contact . The fastest way to get your results is to register for My Chart.   IF you received an x-ray today, you will receive an invoice  from [facility]. Please contact [facility] at [phone] with questions or concerns regarding your invoice.   IF you received labwork today, you will receive an invoice from [place]. Please contact [facility] at [phone] with questions or concerns regarding your invoice.   Our billing staff will not be able to assist you with questions regarding bills from these companies.  You will be contacted with the lab results as soon as they are available. The fastest way to get your results is to activate your My Chart account. Instructions are located on the last page of this paperwork. If you have not heard from [facility] regarding the results in 2 weeks, please contact this office.

## 2020-09-16 ENCOUNTER — Ambulatory Visit: Payer: BC Managed Care – PPO | Admitting: Registered Nurse

## 2020-09-19 ENCOUNTER — Telehealth: Payer: Self-pay | Admitting: Registered Nurse

## 2020-09-19 NOTE — Telephone Encounter (Signed)
Patient called to request refill of   amoxicillin-clavulanate (AUGMENTIN) 875-125 MG tablet [[id number]]  Pharmacy:   [id number] [place], [place] - [address] AT [facility] OF [facility] [address]  [address] [place], [place] [address]  Phone:  [phone] Fax:  [phone]  DEA #:  [id number]  Rx discontinued but patient requesting more.  Please advise at [phone]

## 2020-09-24 ENCOUNTER — Encounter: Payer: Self-pay | Admitting: Registered Nurse

## 2020-09-24 NOTE — Progress Notes (Signed)
Established Patient Office Visit  Subjective:  Patient ID: Shawn Edwards, male    DOB: Feb 28, 1981  Age: 40 y.o. MRN: [id number]  CC:  Chief Complaint  Patient presents with  . Follow-up    Patient states that he is still having some headaches , body aches , dizziness, and also fatigue. Patient has been trying to stand but feels unbalanced    HPI Shawn Edwards presents for continuing post concussive symptoms.  Unfortunately there has been a fairly strong limitation to how much treatment seems to be helping Shawn Edwards.  We again discussed that there is likely a functional neurological symptom disorder and that management with psychiatry and neuropsych is probably our best bet, in conjunction with continuing physical therapy. In addition to the physical trauma he has suffered, there have been a number of intense emotional stressors for him in the preceding 12-18 months.  No symptoms worsening at this time, but continues to have headaches, body aches, dizziness, and fatigue as described throughout previous documentation  Past Medical History:  Diagnosis Date  . Concussion 02/04/2019  . Functional neurological symptom disorder with mixed symptoms   . Known health problems: none   . 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 AUDIOLOGICAL EVALUATION  02/28/2020      . NO PAST SURGERIES      No family history on file.  Social History   Socioeconomic History  . Marital status: Single    Spouse name: Not on file  . Number of children: 0  . Years of education: Not on file  . Highest education level: Associate degree: academic program  Occupational History  . Not on file  Tobacco Use  . Smoking status: Never Smoker  . Smokeless tobacco: Never Used  Vaping Use  . Vaping Use: Never used  Substance and Sexual Activity  . Alcohol use: Never  . Drug use: Never  . Sexual activity: Not on file  Other Topics Concern  . Not on file  Social  History Narrative   Lives alone   Right handed   Caffeine: sometimes soda   Social Determinants of Health   Financial Resource Strain: Not on file  Food Insecurity: Not on file  Transportation Needs: Not on file  Physical Activity: Not on file  Stress: Not on file  Social Connections: Not on file  Intimate Partner Violence: Not on file    Outpatient Medications Prior to Visit  Medication Sig Dispense Refill  . butalbital-acetaminophen-caffeine (FIORICET) 50-325-40 MG tablet TAKE 1 TO 2 TABLETS BY MOUTH EVERY 6 HOURS AS NEEDED FOR HEADACHE 20 tablet 0  . ibuprofen (ADVIL) 600 MG tablet TAKE 1 TABLET(600 MG) BY MOUTH EVERY 6 HOURS AS NEEDED 30 tablet 2  . polyethylene glycol powder (GLYCOLAX/MIRALAX) 17 GM/SCOOP powder Take 17 g by mouth 2 (two) times daily as needed. 3350 g 1  . traZODone (DESYREL) 50 MG tablet TAKE 3 TABLETS(150 MG) BY MOUTH AT BEDTIME FOR SLEEP 90 tablet 0  . ALPRAZolam (XANAX) 0.5 MG tablet Take 1 tablet (0.5 mg total) by mouth at bedtime as needed for anxiety. 60 tablet 0  . buPROPion (WELLBUTRIN XL) 300 MG 24 hr tablet Take 1 tablet (300 mg total) by mouth daily. 90 tablet 1  . cetirizine (ZYRTEC) 10 MG tablet Take 1 tablet (10 mg total) by mouth daily. 30 tablet 11  . cyclobenzaprine (FLEXERIL) 5 MG tablet Take 2 tablets (10 mg total) by mouth 3 (three) times daily  as needed for muscle spasms. 30 tablet 2  . fluticasone (FLONASE) 50 MCG/ACT nasal spray Place 2 sprays into both nostrils daily. 16 g 6  . ketoconazole (NIZORAL) 2 % shampoo APPLY TOPICALLY 2 TIMES A WEEK 120 mL 0  . Meclizine HCl 25 MG CHEW Chew 1 tablet (25 mg total) by mouth in the morning, at noon, and at bedtime. 90 tablet 3  . nortriptyline (PAMELOR) 50 MG capsule Take 2 capsules (100 mg total) by mouth at bedtime. For headache 30 capsule 1  . ondansetron (ZOFRAN) 4 MG tablet TAKE 1 TABLET(4 MG) BY MOUTH EVERY 8 HOURS AS NEEDED FOR NAUSEA OR VOMITING 20 tablet 0  . scopolamine (TRANSDERM-SCOP,  1.5 MG,) 1 MG/3DAYS Place 1 patch (1.5 mg total) onto the skin every 3 (three) days. 10 patch 12  . sertraline (ZOLOFT) 100 MG tablet Take 1 tablet (100 mg total) by mouth daily. 90 tablet 3  . terbinafine (LAMISIL) 250 MG tablet Take 1 tablet (250 mg total) by mouth daily. 84 tablet 0  . Vitamin D, Ergocalciferol, (DRISDOL) 1.25 MG (50000 UNIT) CAPS capsule Take 1 capsule (50,000 Units total) by mouth every 7 (seven) days. 8 capsule 0   No facility-administered medications prior to visit.    Allergies  Allergen Reactions  . Pork-Derived Products     ROS Review of Systems  Constitutional: Positive for fatigue.  HENT: Negative.   Eyes: Positive for photophobia.  Respiratory: Negative.   Cardiovascular: Negative.   Gastrointestinal: Negative.   Genitourinary: Negative.   Musculoskeletal: Positive for gait problem and myalgias.  Skin: Negative.   Neurological: Positive for dizziness.  Psychiatric/Behavioral: Negative.   All other systems reviewed and are negative.     Objective:    Physical Exam Constitutional:      General: He is not in acute distress.    Appearance: Normal appearance. He is normal weight. He is not ill-appearing, toxic-appearing or diaphoretic.  Cardiovascular:     Rate and Rhythm: Normal rate and regular rhythm.     Heart sounds: Normal heart sounds. No murmur heard. No friction rub. No gallop.   Pulmonary:     Effort: Pulmonary effort is normal. No respiratory distress.     Breath sounds: Normal breath sounds. No stridor. No wheezing, rhonchi or rales.  Chest:     Chest wall: No tenderness.  Musculoskeletal:        General: Normal range of motion.     Comments: Positive Hoover sign, positive hip abductor sign  Skin:    General: Skin is warm and dry.  Neurological:     General: No focal deficit present.     Mental Status: He is alert and oriented to person, place, and time. Mental status is at baseline.     Cranial Nerves: No cranial nerve  deficit.     Motor: Weakness present.     Gait: Gait abnormal.  Psychiatric:        Mood and Affect: Mood normal.        Behavior: Behavior normal.        Thought Content: Thought content normal.        Judgment: Judgment normal.     BP 125/82   Pulse 70   Temp 98 F (36.7 C) (Temporal)   Resp 18   Ht 5\' 7"  (1.702 m)   SpO2 98%   BMI 23.65 kg/m  Wt Readings from Last 3 Encounters:  05/16/20 151 lb (68.5 kg)  02/27/20 151 lb (68.5 kg)  02/16/20 151 lb (68.5 kg)     There are no preventive care reminders to display for this patient.  There are no preventive care reminders to display for this patient.  Lab Results  Component Value Date   TSH 2.190 09/18/2019   Lab Results  Component Value Date   WBC 5.6 04/09/2020   HGB 15.7 04/09/2020   HCT 45.5 04/09/2020   MCV 91 04/09/2020   PLT 216 09/18/2019   Lab Results  Component Value Date   NA 140 04/09/2020   K 3.9 04/09/2020   CO2 26 04/09/2020   GLUCOSE 98 04/09/2020   BUN 12 04/09/2020   CREATININE 0.75 (L) 04/09/2020   BILITOT 0.4 04/09/2020   ALKPHOS 59 04/09/2020   AST 15 04/09/2020   ALT 19 04/09/2020   PROT 7.4 04/09/2020   ALBUMIN 4.5 04/09/2020   CALCIUM 9.7 04/09/2020   ANIONGAP 11 02/05/2019   Lab Results  Component Value Date   CHOL 209 (H) 09/18/2019   Lab Results  Component Value Date   HDL 68 09/18/2019   Lab Results  Component Value Date   LDLCALC 117 (H) 09/18/2019   Lab Results  Component Value Date   TRIG 136 09/18/2019   Lab Results  Component Value Date   CHOLHDL 3.1 09/18/2019   Lab Results  Component Value Date   HGBA1C 5.3 09/18/2019      Assessment & Plan:   Problem List Items Addressed This Visit      Other   Functional neurological symptom disorder with mixed symptoms - Primary   Relevant Orders   Ambulatory referral to Psychiatry    Other Visit Diagnoses    Depression, unspecified depression type       Relevant Medications   buPROPion (WELLBUTRIN  XL) 300 MG 24 hr tablet   nortriptyline (PAMELOR) 50 MG capsule   ALPRAZolam (XANAX) 0.5 MG tablet   Other Relevant Orders   Ambulatory referral to Psychiatry   Vitamin D deficiency       Relevant Medications   Vitamin D, Ergocalciferol, (DRISDOL) 1.25 MG (50000 UNIT) CAPS capsule   Dizziness       Relevant Medications   Meclizine HCl 25 MG CHEW   nortriptyline (PAMELOR) 50 MG capsule   scopolamine (TRANSDERM-SCOP, 1.5 MG,) 1 MG/3DAYS   Seasonal allergic rhinitis, unspecified trigger       Relevant Medications   fluticasone (FLONASE) 50 MCG/ACT nasal spray   cetirizine (ZYRTEC) 10 MG tablet   Sleep disturbance       Relevant Medications   nortriptyline (PAMELOR) 50 MG capsule   Chronic post-traumatic headache, not intractable       Relevant Medications   buPROPion (WELLBUTRIN XL) 300 MG 24 hr tablet   nortriptyline (PAMELOR) 50 MG capsule   cyclobenzaprine (FLEXERIL) 5 MG tablet   Muscle spasm       Relevant Medications   cyclobenzaprine (FLEXERIL) 5 MG tablet   Dandruff in adult       Relevant Medications   ketoconazole (NIZORAL) 2 % shampoo   Non-intractable vomiting with nausea, unspecified vomiting type       Relevant Medications   ondansetron (ZOFRAN) 4 MG tablet      Meds ordered this encounter  Medications  . Vitamin D, Ergocalciferol, (DRISDOL) 1.25 MG (50000 UNIT) CAPS capsule    Sig: Take 1 capsule (50,000 Units total) by mouth every 7 (seven) days.    Dispense:  12 capsule    Refill:  0  Instructions in arabic please    Order Specific Question:   Supervising Provider    Answer:   Neva Seat, JEFFREY R [[id number]]  . buPROPion (WELLBUTRIN XL) 300 MG 24 hr tablet    Sig: Take 1 tablet (300 mg total) by mouth daily.    Dispense:  90 tablet    Refill:  1    Order Specific Question:   Supervising Provider    Answer:   Neva Seat, JEFFREY R [[id number]]  . Meclizine HCl 25 MG CHEW    Sig: Chew 1 tablet (25 mg total) by mouth in the morning, at noon, and at bedtime.     Dispense:  90 tablet    Refill:  3    Order Specific Question:   Supervising Provider    Answer:   Neva Seat, JEFFREY R [[id number]]  . fluticasone (FLONASE) 50 MCG/ACT nasal spray    Sig: Place 2 sprays into both nostrils daily.    Dispense:  16 g    Refill:  6    Order Specific Question:   Supervising Provider    Answer:   Neva Seat, JEFFREY R [[id number]]  . nortriptyline (PAMELOR) 50 MG capsule    Sig: Take 2 capsules (100 mg total) by mouth at bedtime. For headache    Dispense:  30 capsule    Refill:  1    Arabic. Increased to 100mg  from 75mg     Order Specific Question:   Supervising Provider    Answer:   , JEFFREY R [[id number]]  . cetirizine (ZYRTEC) 10 MG tablet    Sig: Take 1 tablet (10 mg total) by mouth daily.    Dispense:  30 tablet    Refill:  11    Order Specific Question:   Supervising Provider    Answer:   , JEFFREY R [[id number]]  . cyclobenzaprine (FLEXERIL) 5 MG tablet    Sig: Take 2 tablets (10 mg total) by mouth 3 (three) times daily as needed for muscle spasms.    Dispense:  30 tablet    Refill:  2    Order Specific Question:   Supervising Provider    Answer:   Neva Seat, JEFFREY R [[id number]]  . scopolamine (TRANSDERM-SCOP, 1.5 MG,) 1 MG/3DAYS    Sig: Place 1 patch (1.5 mg total) onto the skin every 3 (three) days.    Dispense:  10 patch    Refill:  12    Order Specific Question:   Supervising Provider    Answer:   Neva Seat, JEFFREY R [[id number]]  . ALPRAZolam (XANAX) 0.5 MG tablet    Sig: Take 1 tablet (0.5 mg total) by mouth at bedtime as needed for anxiety.    Dispense:  60 tablet    Refill:  0    Instructions in arabic please    Order Specific Question:   Supervising Provider    Answer:   Neva Seat, JEFFREY R [[id number]]  . ketoconazole (NIZORAL) 2 % shampoo    Sig: APPLY TOPICALLY 2 TIMES A WEEK    Dispense:  120 mL    Refill:  0    Order Specific Question:   Supervising Provider    Answer:   Neva Seat, JEFFREY R [[id number]]  . ondansetron (ZOFRAN) 4 MG tablet    Sig: Take 1 tablet by  mouth every 8 hours  as needed for nausea    Dispense:  40 tablet    Refill:  0    Order Specific Question:   Supervising Provider    Answer:   Neva Seat,  JEFFREY R [[id number]]    Follow-up: No follow-ups on file.   PLAN  Refill medication as above  Discussed implications of positive hip abductor and hoover signs with patient, who voiced some further understanding of his functional neurological process or perhaps a conversion disorder. I tried to use this as a further opportunity to encourage treatment with psychiatry and neuropsych. Pt is agreeable to plan.  Discussed with patient that though this is a neuropsych process, it does not limit his symptoms or lessen them in any way - what he is experiencing is still very real, though course of treatment changes.  Reviewed recent labs.  Return in 6 weeks.  Patient encouraged to call clinic with any questions, comments, or concerns.  I spent 48 minutes with this patient, more than 50% of which was spent counseling and/or educating.  Janeece Agee, NP

## 2020-09-24 NOTE — Telephone Encounter (Signed)
Abx refused, LM for pt. Needs to see community Dentistry

## 2020-09-24 NOTE — Telephone Encounter (Signed)
Not appropriate at this time, he needs to establish with community dentist. He and I have discussed this a number of times and I have provided him information  Thanks,  Luan Pulling

## 2020-09-25 ENCOUNTER — Ambulatory Visit: Payer: BC Managed Care – PPO | Admitting: Registered Nurse

## 2020-09-27 ENCOUNTER — Ambulatory Visit: Payer: BC Managed Care – PPO | Admitting: Gastroenterology

## 2020-10-01 ENCOUNTER — Other Ambulatory Visit: Payer: Self-pay | Admitting: Registered Nurse

## 2020-10-01 DIAGNOSIS — M272 Inflammatory conditions of jaws: Secondary | ICD-10-CM

## 2020-10-08 ENCOUNTER — Ambulatory Visit (INDEPENDENT_AMBULATORY_CARE_PROVIDER_SITE_OTHER): Payer: BC Managed Care – PPO | Admitting: Registered Nurse

## 2020-10-08 ENCOUNTER — Encounter: Payer: Self-pay | Admitting: Registered Nurse

## 2020-10-08 ENCOUNTER — Other Ambulatory Visit: Payer: Self-pay

## 2020-10-08 VITALS — BP 130/83 | HR 85 | Temp 98.0°F | Resp 18 | Ht 67.0 in

## 2020-10-08 DIAGNOSIS — G479 Sleep disorder, unspecified: Secondary | ICD-10-CM | POA: Diagnosis not present

## 2020-10-08 DIAGNOSIS — R112 Nausea with vomiting, unspecified: Secondary | ICD-10-CM | POA: Diagnosis not present

## 2020-10-08 DIAGNOSIS — F447 Conversion disorder with mixed symptom presentation: Secondary | ICD-10-CM

## 2020-10-08 DIAGNOSIS — G44329 Chronic post-traumatic headache, not intractable: Secondary | ICD-10-CM | POA: Diagnosis not present

## 2020-10-08 DIAGNOSIS — K047 Periapical abscess without sinus: Secondary | ICD-10-CM

## 2020-10-08 DIAGNOSIS — R42 Dizziness and giddiness: Secondary | ICD-10-CM

## 2020-10-08 DIAGNOSIS — F32A Depression, unspecified: Secondary | ICD-10-CM

## 2020-10-08 MED ORDER — AMOXICILLIN-POT CLAVULANATE 875-125 MG PO TABS: 1.0000 | ORAL_TABLET | Freq: Two times a day (BID) | ORAL | 0 refills | Status: DC

## 2020-10-08 MED ORDER — IBUPROFEN 600 MG PO TABS: ORAL_TABLET | ORAL | 2 refills | Status: DC

## 2020-10-08 MED ORDER — SERTRALINE HCL 50 MG PO TABS: 50.0000 mg | ORAL_TABLET | Freq: Every day | ORAL | 3 refills | Status: DC

## 2020-10-08 MED ORDER — MECLIZINE HCL 25 MG PO CHEW: 25.0000 mg | CHEWABLE_TABLET | Freq: Three times a day (TID) | ORAL | 3 refills | Status: DC

## 2020-10-08 MED ORDER — PREDNISONE 20 MG PO TABS: 20.0000 mg | ORAL_TABLET | Freq: Every day | ORAL | 0 refills | Status: DC

## 2020-10-08 MED ORDER — BUTALBITAL-APAP-CAFFEINE 50-325-40 MG PO TABS: ORAL_TABLET | ORAL | 0 refills | Status: DC

## 2020-10-08 MED ORDER — TRAZODONE HCL 50 MG PO TABS
ORAL_TABLET | ORAL | 0 refills | Status: DC
Start: 2020-10-08 — End: 2020-12-16

## 2020-10-08 MED ORDER — ONDANSETRON HCL 4 MG PO TABS: ORAL_TABLET | ORAL | 0 refills | Status: DC

## 2020-10-08 NOTE — Patient Instructions (Signed)
° ° ° °  If you have lab work done today you will be contacted with your lab results within the next 2 weeks.  If you have not heard from us then please contact us. The fastest way to get your results is to register for My Chart. ° ° °IF you received an x-ray today, you will receive an invoice from [facility]. Please contact  Radiology at [phone] with questions or concerns regarding your invoice.  ° °IF you received labwork today, you will receive an invoice from [facility]. Please contact [facility] at [phone] with questions or concerns regarding your invoice.  ° °Our billing staff will not be able to assist you with questions regarding bills from these companies. ° °You will be contacted with the lab results as soon as they are available. The fastest way to get your results is to activate your My Chart account. Instructions are located on the last page of this paperwork. If you have not heard from us regarding the results in 2 weeks, please contact this office. °  ° ° ° °

## 2020-10-08 NOTE — Progress Notes (Signed)
Established Patient Office Visit  Subjective:  Patient ID: Shawn Edwards, male    DOB: Aug 19, 1980  Age: 40 y.o. MRN: [id number]  CC:  Chief Complaint  Patient presents with  . Follow-up    Patient states he is here for his 1 month follow up. Patient states he is still having problems with his headache , left side pain , no appetite , and still getting dizzy when trying to stand.  . Medication Refill    Patient needs a medication refills he has it wrote down on a paper.    HPI Shawn Edwards presents for follow up   He is starting to have concerns about improvement and used the term "last chance" a number of time through the visit. Staunchly denies HI/SI and self harm behavior, corrects himself stating this was an issue of translation and miscommunication.  Would like to reestablish PT with Bufford LopeAudra Potter, who was very helpful before.  He is optimistic in regards to authorization for visit with Dr. Jannifer FranklinAkintayo, from whom I think he would benefit greatly for both medication management and further assessment of what seems to be a functional neurological condition. He does express further understanding of this process but still faces great limitations - continuing PT and getting adequate med management will help  It was suggested by another provider - either Sports Med Dr. Denyse Amassorey or Bufford LopeAudra Potter, PT, that he may consider optic neuritis as a potential contributor to headaches. He is unsure if he has a follow up with neurology - I will look into this and request appt on his behalf if he does not have upcoming follow up .  In addition, unfortunately his tooth has again become infected. He has been unable to find a [facility] after discussions throughout last few visits. I again emphasized the need for this and ensured he had access to local dentists contact information.  Otherwise, he is stable. Some emotional lability in the visit today. Certainly needs to establish with psychiatry.  Past  Medical History:  Diagnosis Date  . Concussion 02/04/2019  . Functional neurological symptom disorder with mixed symptoms   . Known health problems: none   . 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 AUDIOLOGICAL EVALUATION  02/28/2020      . NO PAST SURGERIES      No family history on file.  Social History   Socioeconomic History  . Marital status: Single    Spouse name: Not on file  . Number of children: 0  . Years of education: Not on file  . Highest education level: Associate degree: academic program  Occupational History  . Not on file  Tobacco Use  . Smoking status: Never Smoker  . Smokeless tobacco: Never Used  Vaping Use  . Vaping Use: Never used  Substance and Sexual Activity  . Alcohol use: Never  . Drug use: Never  . Sexual activity: Not on file  Other Topics Concern  . Not on file  Social History Narrative   Lives alone   Right handed   Caffeine: sometimes soda   Social Determinants of Health   Financial Resource Strain: Not on file  Food Insecurity: Not on file  Transportation Needs: Not on file  Physical Activity: Not on file  Stress: Not on file  Social Connections: Not on file  Intimate Partner Violence: Not on file    Outpatient Medications Prior to Visit  Medication Sig Dispense Refill  . ALPRAZolam Prudy Feeler(XANAX)  0.5 MG tablet Take 1 tablet (0.5 mg total) by mouth at bedtime as needed for anxiety. 60 tablet 0  . buPROPion (WELLBUTRIN XL) 300 MG 24 hr tablet Take 1 tablet (300 mg total) by mouth daily. 90 tablet 1  . cetirizine (ZYRTEC) 10 MG tablet Take 1 tablet (10 mg total) by mouth daily. 30 tablet 11  . cyclobenzaprine (FLEXERIL) 5 MG tablet Take 2 tablets (10 mg total) by mouth 3 (three) times daily as needed for muscle spasms. 30 tablet 2  . fluticasone (FLONASE) 50 MCG/ACT nasal spray Place 2 sprays into both nostrils daily. 16 g 6  . ketoconazole (NIZORAL) 2 % shampoo APPLY TOPICALLY 2 TIMES A WEEK  120 mL 0  . nortriptyline (PAMELOR) 50 MG capsule Take 2 capsules (100 mg total) by mouth at bedtime. For headache 30 capsule 1  . polyethylene glycol powder (GLYCOLAX/MIRALAX) 17 GM/SCOOP powder Take 17 g by mouth 2 (two) times daily as needed. 3350 g 1  . scopolamine (TRANSDERM-SCOP, 1.5 MG,) 1 MG/3DAYS Place 1 patch (1.5 mg total) onto the skin every 3 (three) days. 10 patch 12  . Vitamin D, Ergocalciferol, (DRISDOL) 1.25 MG (50000 UNIT) CAPS capsule Take 1 capsule (50,000 Units total) by mouth every 7 (seven) days. 12 capsule 0  . butalbital-acetaminophen-caffeine (FIORICET) 50-325-40 MG tablet TAKE 1 TO 2 TABLETS BY MOUTH EVERY 6 HOURS AS NEEDED FOR HEADACHE 20 tablet 0  . ibuprofen (ADVIL) 600 MG tablet TAKE 1 TABLET(600 MG) BY MOUTH EVERY 6 HOURS AS NEEDED 30 tablet 2  . Meclizine HCl 25 MG CHEW Chew 1 tablet (25 mg total) by mouth in the morning, at noon, and at bedtime. 90 tablet 3  . ondansetron (ZOFRAN) 4 MG tablet Take 1 tablet by mouth every 8 hours  as needed for nausea 40 tablet 0  . traZODone (DESYREL) 50 MG tablet TAKE 3 TABLETS(150 MG) BY MOUTH AT BEDTIME FOR SLEEP 90 tablet 0   No facility-administered medications prior to visit.    Allergies  Allergen Reactions  . Pork-Derived Products     ROS Review of Systems Per hpi     Objective:    Physical Exam Constitutional:      General: He is not in acute distress.    Appearance: Normal appearance. He is normal weight. He is not ill-appearing, toxic-appearing or diaphoretic.  Cardiovascular:     Rate and Rhythm: Normal rate and regular rhythm.     Heart sounds: Normal heart sounds. No murmur heard. No friction rub. No gallop.   Pulmonary:     Effort: Pulmonary effort is normal. No respiratory distress.     Breath sounds: Normal breath sounds. No stridor. No wheezing, rhonchi or rales.  Chest:     Chest wall: No tenderness.  Neurological:     General: No focal deficit present.     Mental Status: He is alert and  oriented to person, place, and time. Mental status is at baseline.  Psychiatric:        Mood and Affect: Mood normal.        Behavior: Behavior normal.        Thought Content: Thought content normal.        Judgment: Judgment normal.     BP 130/83   Pulse 85   Temp 98 F (36.7 C) (Temporal)   Resp 18   Ht 5\' 7"  (1.702 m)   SpO2 97%   BMI 23.65 kg/m  Wt Readings from Last 3 Encounters:  05/16/20  151 lb (68.5 kg)  02/27/20 151 lb (68.5 kg)  02/16/20 151 lb (68.5 kg)     There are no preventive care reminders to display for this patient.  There are no preventive care reminders to display for this patient.  Lab Results  Component Value Date   TSH 2.190 09/18/2019   Lab Results  Component Value Date   WBC 5.6 04/09/2020   HGB 15.7 04/09/2020   HCT 45.5 04/09/2020   MCV 91 04/09/2020   PLT 216 09/18/2019   Lab Results  Component Value Date   NA 140 04/09/2020   K 3.9 04/09/2020   CO2 26 04/09/2020   GLUCOSE 98 04/09/2020   BUN 12 04/09/2020   CREATININE 0.75 (L) 04/09/2020   BILITOT 0.4 04/09/2020   ALKPHOS 59 04/09/2020   AST 15 04/09/2020   ALT 19 04/09/2020   PROT 7.4 04/09/2020   ALBUMIN 4.5 04/09/2020   CALCIUM 9.7 04/09/2020   ANIONGAP 11 02/05/2019   Lab Results  Component Value Date   CHOL 209 (H) 09/18/2019   Lab Results  Component Value Date   HDL 68 09/18/2019   Lab Results  Component Value Date   LDLCALC 117 (H) 09/18/2019   Lab Results  Component Value Date   TRIG 136 09/18/2019   Lab Results  Component Value Date   CHOLHDL 3.1 09/18/2019   Lab Results  Component Value Date   HGBA1C 5.3 09/18/2019      Assessment & Plan:   Problem List Items Addressed This Visit      Other   Functional neurological symptom disorder with mixed symptoms - Primary   Relevant Medications   sertraline (ZOLOFT) 50 MG tablet   predniSONE (DELTASONE) 20 MG tablet    Other Visit Diagnoses    Chronic post-traumatic headache, not intractable        Relevant Medications   butalbital-acetaminophen-caffeine (FIORICET) 50-325-40 MG tablet   traZODone (DESYREL) 50 MG tablet   ibuprofen (ADVIL) 600 MG tablet   sertraline (ZOLOFT) 50 MG tablet   predniSONE (DELTASONE) 20 MG tablet   Sleep disturbance       Relevant Medications   traZODone (DESYREL) 50 MG tablet   predniSONE (DELTASONE) 20 MG tablet   Non-intractable vomiting with nausea, unspecified vomiting type       Relevant Medications   ondansetron (ZOFRAN) 4 MG tablet   predniSONE (DELTASONE) 20 MG tablet   Dizziness       Relevant Medications   Meclizine HCl 25 MG CHEW   predniSONE (DELTASONE) 20 MG tablet   Depression, unspecified depression type       Relevant Medications   traZODone (DESYREL) 50 MG tablet   sertraline (ZOLOFT) 50 MG tablet   predniSONE (DELTASONE) 20 MG tablet   Dental infection       Relevant Medications   amoxicillin-clavulanate (AUGMENTIN) 875-125 MG tablet   predniSONE (DELTASONE) 20 MG tablet      Meds ordered this encounter  Medications  . butalbital-acetaminophen-caffeine (FIORICET) 50-325-40 MG tablet    Sig: TAKE 1 TO 2 TABLETS BY MOUTH EVERY 6 HOURS AS NEEDED FOR HEADACHE    Dispense:  20 tablet    Refill:  0    Order Specific Question:   Supervising Provider    Answer:   Neva Seat, JEFFREY R [[id number]]  . traZODone (DESYREL) 50 MG tablet    Sig: TAKE 3 TABLETS(150 MG) BY MOUTH AT BEDTIME FOR SLEEP    Dispense:  90 tablet    Refill:  0    Order Specific Question:   Supervising Provider    Answer:   Neva Seat, JEFFREY R [[id number]]  . ondansetron (ZOFRAN) 4 MG tablet    Sig: Take 1 tablet by mouth every 8 hours  as needed for nausea    Dispense:  40 tablet    Refill:  0    Order Specific Question:   Supervising Provider    Answer:   Neva Seat, JEFFREY R [[id number]]  . Meclizine HCl 25 MG CHEW    Sig: Chew 1 tablet (25 mg total) by mouth in the morning, at noon, and at bedtime.    Dispense:  90 tablet    Refill:  3    Order Specific Question:    Supervising Provider    Answer:   Neva Seat, JEFFREY R [[id number]]  . ibuprofen (ADVIL) 600 MG tablet    Sig: TAKE 1 TABLET(600 MG) BY MOUTH EVERY 6 HOURS AS NEEDED    Dispense:  30 tablet    Refill:  2    Order Specific Question:   Supervising Provider    Answer:   Neva Seat, JEFFREY R [[id number]]  . amoxicillin-clavulanate (AUGMENTIN) 875-125 MG tablet    Sig: Take 1 tablet by mouth 2 (two) times daily.    Dispense:  20 tablet    Refill:  0    Order Specific Question:   Supervising Provider    Answer:   Neva Seat, JEFFREY R [[id number]]  . sertraline (ZOLOFT) 50 MG tablet    Sig: Take 1 tablet (50 mg total) by mouth daily.    Dispense:  90 tablet    Refill:  3    Order Specific Question:   Supervising Provider    Answer:   Neva Seat, JEFFREY R [[id number]]  . predniSONE (DELTASONE) 20 MG tablet    Sig: Take 1 tablet (20 mg total) by mouth daily with breakfast.    Dispense:  5 tablet    Refill:  0    Order Specific Question:   Supervising Provider    Answer:   Neva Seat, JEFFREY R [[id number]]    Follow-up: Return in about 6 weeks (around 11/19/2020) for 5-6 weeks for follow up.   PLAN  Refill meds as above  Will trial short course of prednisone 20mg  PO qd.   Close follow up with psychiatry as soon as he is able.  Will send message to PT to help him reestablish visits   Patient encouraged to call clinic with any questions, comments, or concerns.  , NP

## 2020-10-14 ENCOUNTER — Other Ambulatory Visit: Payer: Self-pay | Admitting: Registered Nurse

## 2020-10-14 DIAGNOSIS — M62838 Other muscle spasm: Secondary | ICD-10-CM

## 2020-10-14 DIAGNOSIS — F447 Conversion disorder with mixed symptom presentation: Secondary | ICD-10-CM

## 2020-10-14 DIAGNOSIS — R202 Paresthesia of skin: Secondary | ICD-10-CM

## 2020-10-14 DIAGNOSIS — R200 Anesthesia of skin: Secondary | ICD-10-CM

## 2020-10-14 DIAGNOSIS — H8190 Unspecified disorder of vestibular function, unspecified ear: Secondary | ICD-10-CM

## 2020-10-14 DIAGNOSIS — R531 Weakness: Secondary | ICD-10-CM

## 2020-10-14 DIAGNOSIS — H818X9 Other disorders of vestibular function, unspecified ear: Secondary | ICD-10-CM

## 2020-10-14 DIAGNOSIS — G44329 Chronic post-traumatic headache, not intractable: Secondary | ICD-10-CM

## 2020-10-14 DIAGNOSIS — F0781 Postconcussional syndrome: Secondary | ICD-10-CM

## 2020-11-04 ENCOUNTER — Ambulatory Visit: Payer: BC Managed Care – PPO | Admitting: Physical Therapy

## 2020-11-14 ENCOUNTER — Other Ambulatory Visit: Payer: Self-pay | Admitting: Registered Nurse

## 2020-11-14 DIAGNOSIS — R112 Nausea with vomiting, unspecified: Secondary | ICD-10-CM

## 2020-11-14 DIAGNOSIS — F447 Conversion disorder with mixed symptom presentation: Secondary | ICD-10-CM

## 2020-11-14 DIAGNOSIS — R42 Dizziness and giddiness: Secondary | ICD-10-CM

## 2020-11-14 DIAGNOSIS — K047 Periapical abscess without sinus: Secondary | ICD-10-CM

## 2020-11-14 DIAGNOSIS — G479 Sleep disorder, unspecified: Secondary | ICD-10-CM

## 2020-11-14 DIAGNOSIS — F32A Depression, unspecified: Secondary | ICD-10-CM

## 2020-11-14 DIAGNOSIS — G44329 Chronic post-traumatic headache, not intractable: Secondary | ICD-10-CM

## 2020-11-14 DIAGNOSIS — L210 Seborrhea capitis: Secondary | ICD-10-CM

## 2020-11-14 MED ORDER — ALPRAZOLAM 0.5 MG PO TABS: 0.5000 mg | ORAL_TABLET | Freq: Every evening | ORAL | 0 refills | Status: DC | PRN

## 2020-11-14 MED ORDER — KETOCONAZOLE 2 % EX SHAM: MEDICATED_SHAMPOO | CUTANEOUS | 0 refills | Status: DC

## 2020-11-19 ENCOUNTER — Ambulatory Visit: Payer: BC Managed Care – PPO | Admitting: Physical Therapy

## 2020-11-19 ENCOUNTER — Ambulatory Visit: Payer: BC Managed Care – PPO | Admitting: Registered Nurse

## 2020-11-20 ENCOUNTER — Ambulatory Visit: Payer: BC Managed Care – PPO | Admitting: Gastroenterology

## 2020-11-21 ENCOUNTER — Ambulatory Visit: Payer: BC Managed Care – PPO | Admitting: Registered Nurse

## 2020-11-25 ENCOUNTER — Ambulatory Visit: Payer: BC Managed Care – PPO | Admitting: Registered Nurse

## 2020-11-28 ENCOUNTER — Ambulatory Visit: Payer: BC Managed Care – PPO | Attending: Registered Nurse | Admitting: Physical Therapy

## 2020-11-28 ENCOUNTER — Other Ambulatory Visit: Payer: Self-pay

## 2020-11-28 ENCOUNTER — Encounter: Payer: Self-pay | Admitting: Physical Therapy

## 2020-11-28 DIAGNOSIS — R208 Other disturbances of skin sensation: Secondary | ICD-10-CM | POA: Diagnosis present

## 2020-11-28 DIAGNOSIS — M6281 Muscle weakness (generalized): Secondary | ICD-10-CM | POA: Insufficient documentation

## 2020-11-28 DIAGNOSIS — R2689 Other abnormalities of gait and mobility: Secondary | ICD-10-CM | POA: Diagnosis present

## 2020-11-28 DIAGNOSIS — M542 Cervicalgia: Secondary | ICD-10-CM | POA: Insufficient documentation

## 2020-11-28 DIAGNOSIS — R278 Other lack of coordination: Secondary | ICD-10-CM | POA: Diagnosis present

## 2020-11-28 DIAGNOSIS — R42 Dizziness and giddiness: Secondary | ICD-10-CM | POA: Insufficient documentation

## 2020-11-28 DIAGNOSIS — R2681 Unsteadiness on feet: Secondary | ICD-10-CM | POA: Insufficient documentation
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
## 2020-12-02 ENCOUNTER — Other Ambulatory Visit: Payer: Self-pay

## 2020-12-02 ENCOUNTER — Ambulatory Visit (INDEPENDENT_AMBULATORY_CARE_PROVIDER_SITE_OTHER): Payer: BC Managed Care – PPO | Admitting: Registered Nurse

## 2020-12-02 ENCOUNTER — Encounter: Payer: Self-pay | Admitting: Registered Nurse

## 2020-12-02 VITALS — BP 138/88 | HR 85 | Temp 98.3°F | Resp 19 | Ht 67.0 in

## 2020-12-02 DIAGNOSIS — Z130 Encounter for screening for diseases of the blood and blood-forming organs and certain disorders involving the immune mechanism: Secondary | ICD-10-CM | POA: Diagnosis not present

## 2020-12-02 DIAGNOSIS — Z1329 Encounter for screening for other suspected endocrine disorder: Secondary | ICD-10-CM

## 2020-12-02 DIAGNOSIS — R531 Weakness: Secondary | ICD-10-CM

## 2020-12-02 DIAGNOSIS — E559 Vitamin D deficiency, unspecified: Secondary | ICD-10-CM | POA: Diagnosis not present

## 2020-12-02 DIAGNOSIS — G479 Sleep disorder, unspecified: Secondary | ICD-10-CM

## 2020-12-02 DIAGNOSIS — J302 Other seasonal allergic rhinitis: Secondary | ICD-10-CM

## 2020-12-02 DIAGNOSIS — Z13228 Encounter for screening for other metabolic disorders: Secondary | ICD-10-CM | POA: Diagnosis not present

## 2020-12-02 DIAGNOSIS — R059 Cough, unspecified: Secondary | ICD-10-CM | POA: Diagnosis not present

## 2020-12-02 DIAGNOSIS — Z13220 Encounter for screening for lipoid disorders: Secondary | ICD-10-CM

## 2020-12-02 DIAGNOSIS — H938X3 Other specified disorders of ear, bilateral: Secondary | ICD-10-CM

## 2020-12-02 MED ORDER — VITAMIN D (ERGOCALCIFEROL) 1.25 MG (50000 UNIT) PO CAPS: 50000.0000 [IU] | ORAL_CAPSULE | ORAL | 0 refills | Status: DC

## 2020-12-02 MED ORDER — HYDROXYZINE HCL 10 MG PO TABS: 10.0000 mg | ORAL_TABLET | Freq: Three times a day (TID) | ORAL | 0 refills | Status: DC | PRN

## 2020-12-02 MED ORDER — FLUTICASONE PROPIONATE 50 MCG/ACT NA SUSP: 2.0000 | Freq: Every day | NASAL | 6 refills | Status: DC

## 2020-12-02 MED ORDER — DOXYCYCLINE HYCLATE 100 MG PO TABS: 100.0000 mg | ORAL_TABLET | Freq: Two times a day (BID) | ORAL | 0 refills | Status: DC

## 2020-12-02 NOTE — Progress Notes (Signed)
Established Patient Office Visit  Subjective:  Patient ID: Shawn Edwards, male    DOB: [birth year]  Age: 40 y.o. MRN: [id number]  CC:  Chief Complaint  Patient presents with  . Follow-up    Patient is here for follow up he is still having some headaches, memory loss, dizziness. He also need a medication refill.    HPI Shawn Edwards presents for follow up  Overall feeling better. Still headaches, weakness, and dizziness Does have some visual changes, feels hazy vision when exercising Still having some nv when exercising Motion sickness when in the car too long.  Looking forward to getting back to PT with Bufford Lope, PT. Has started with one session.  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 Was improved with augmentin use. We have had him on three courses of this in the past year or so. He is requesting a refill  Does note some coughing, mild shob. Some wheezing. No loc. Does not seem to be contributing to his dizziness.   Otherwise no acute concerns  Referrals approved for dentistry and psychiatry Dr. Jannifer Franklin but unfortunately has not yet made appointments with either.   Past Medical History:  Diagnosis Date  . Concussion 02/04/2019  . Functional neurological symptom disorder with mixed symptoms   . Known health problems: none   . 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 AUDIOLOGICAL EVALUATION  02/28/2020      . NO PAST SURGERIES      No family history on file.  Social History   Socioeconomic History  . Marital status: Single    Spouse name: Not on file  . Number of children: 0  . Years of education: Not on file  . Highest education level: Associate degree: academic program  Occupational History  . Not on file  Tobacco Use  . Smoking status: Never Smoker  . Smokeless tobacco: Never Used  Vaping Use  . Vaping Use: Never used  Substance and Sexual Activity  . Alcohol use: Never  . Drug use: Never  . Sexual  activity: Not on file  Other Topics Concern  . Not on file  Social History Narrative   Lives alone   Right handed   Caffeine: sometimes soda   Social Determinants of Health   Financial Resource Strain: Not on file  Food Insecurity: Not on file  Transportation Needs: Not on file  Physical Activity: Not on file  Stress: Not on file  Social Connections: Not on file  Intimate Partner Violence: Not on file    Outpatient Medications Prior to Visit  Medication Sig Dispense Refill  . ALPRAZolam (XANAX) 0.5 MG tablet Take 1 tablet (0.5 mg total) by mouth at bedtime as needed for anxiety. 60 tablet 0  . buPROPion (WELLBUTRIN XL) 300 MG 24 hr tablet Take 1 tablet (300 mg total) by mouth daily. 90 tablet 1  . cetirizine (ZYRTEC) 10 MG tablet Take 1 tablet (10 mg total) by mouth daily. 30 tablet 11  . cyclobenzaprine (FLEXERIL) 5 MG tablet Take 2 tablets (10 mg total) by mouth 3 (three) times daily as needed for muscle spasms. 30 tablet 2  . ketoconazole (NIZORAL) 2 % shampoo APPLY TOPICALLY 2 TIMES A WEEK 120 mL 0  . nortriptyline (PAMELOR) 50 MG capsule Take 2 capsules (100 mg total) by mouth at bedtime. For headache 30 capsule 1  . ondansetron (ZOFRAN) 4 MG tablet Take 1 tablet by mouth every 8 hours  as needed for  nausea 40 tablet 0  . polyethylene glycol powder (GLYCOLAX/MIRALAX) 17 GM/SCOOP powder Take 17 g by mouth 2 (two) times daily as needed. 3350 g 1  . scopolamine (TRANSDERM-SCOP, 1.5 MG,) 1 MG/3DAYS Place 1 patch (1.5 mg total) onto the skin every 3 (three) days. 10 patch 12  . sertraline (ZOLOFT) 50 MG tablet Take 1 tablet (50 mg total) by mouth daily. 90 tablet 3  . traZODone (DESYREL) 50 MG tablet TAKE 3 TABLETS(150 MG) BY MOUTH AT BEDTIME FOR SLEEP 90 tablet 0  . amoxicillin-clavulanate (AUGMENTIN) 875-125 MG tablet Take 1 tablet by mouth 2 (two) times daily. 20 tablet 0  . butalbital-acetaminophen-caffeine (FIORICET) 50-325-40 MG tablet TAKE 1 TO 2 TABLETS BY MOUTH EVERY 6 HOURS  AS NEEDED FOR HEADACHE 20 tablet 0  . fluticasone (FLONASE) 50 MCG/ACT nasal spray Place 2 sprays into both nostrils daily. 16 g 6  . ibuprofen (ADVIL) 600 MG tablet TAKE 1 TABLET(600 MG) BY MOUTH EVERY 6 HOURS AS NEEDED 30 tablet 2  . Meclizine HCl 25 MG CHEW Chew 1 tablet (25 mg total) by mouth in the morning, at noon, and at bedtime. 90 tablet 3  . predniSONE (DELTASONE) 20 MG tablet Take 1 tablet (20 mg total) by mouth daily with breakfast. 5 tablet 0  . Vitamin D, Ergocalciferol, (DRISDOL) 1.25 MG (50000 UNIT) CAPS capsule Take 1 capsule (50,000 Units total) by mouth every 7 (seven) days. 12 capsule 0   No facility-administered medications prior to visit.    Allergies  Allergen Reactions  . Pork-Derived Products     ROS Review of Systems  Constitutional: Negative.   HENT: Negative.   Eyes: Negative.   Respiratory: Negative.   Cardiovascular: Negative.   Gastrointestinal: Negative.   Genitourinary: Negative.   Musculoskeletal: Negative.   Skin: Negative.   Neurological: Negative.   Psychiatric/Behavioral: Negative.   All other systems reviewed and are negative.     Objective:    Physical Exam Constitutional:      General: He is not in acute distress.    Appearance: Normal appearance. He is normal weight. He is not ill-appearing, toxic-appearing or diaphoretic.  HENT:     Right Ear: Tympanic membrane, ear canal and external ear normal.     Left Ear: There is impacted cerumen.     Nose: Congestion present.  Cardiovascular:     Rate and Rhythm: Normal rate and regular rhythm.     Heart sounds: Normal heart sounds. No murmur heard. No friction rub. No gallop.   Pulmonary:     Effort: Pulmonary effort is normal. No respiratory distress.     Breath sounds: Normal breath sounds. No stridor. No wheezing, rhonchi or rales.  Chest:     Chest wall: No tenderness.  Neurological:     General: No focal deficit present.     Mental Status: He is alert and oriented to person,  place, and time. Mental status is at baseline.  Psychiatric:        Mood and Affect: Mood normal.        Behavior: Behavior normal.        Thought Content: Thought content normal.        Judgment: Judgment normal.     BP 138/88   Pulse 85   Temp 98.3 F (36.8 C) (Temporal)   Resp 19   Ht 5\' 7"  (1.702 m)   SpO2 99%   BMI 23.65 kg/m  Wt Readings from Last 3 Encounters:  05/16/20 151 lb (68.5  kg)  02/27/20 151 lb (68.5 kg)  02/16/20 151 lb (68.5 kg)     There are no preventive care reminders to display for this patient.  There are no preventive care reminders to display for this patient.  Lab Results  Component Value Date   TSH 2.190 09/18/2019   Lab Results  Component Value Date   WBC 5.6 04/09/2020   HGB 15.7 04/09/2020   HCT 45.5 04/09/2020   MCV 91 04/09/2020   PLT 216 09/18/2019   Lab Results  Component Value Date   NA 140 04/09/2020   K 3.9 04/09/2020   CO2 26 04/09/2020   GLUCOSE 98 04/09/2020   BUN 12 04/09/2020   CREATININE 0.75 (L) 04/09/2020   BILITOT 0.4 04/09/2020   ALKPHOS 59 04/09/2020   AST 15 04/09/2020   ALT 19 04/09/2020   PROT 7.4 04/09/2020   ALBUMIN 4.5 04/09/2020   CALCIUM 9.7 04/09/2020   ANIONGAP 11 02/05/2019   Lab Results  Component Value Date   CHOL 209 (H) 09/18/2019   Lab Results  Component Value Date   HDL 68 09/18/2019   Lab Results  Component Value Date   LDLCALC 117 (H) 09/18/2019   Lab Results  Component Value Date   TRIG 136 09/18/2019   Lab Results  Component Value Date   CHOLHDL 3.1 09/18/2019   Lab Results  Component Value Date   HGBA1C 5.3 09/18/2019      Assessment & Plan:   Problem List Items Addressed This Visit   None   Visit Diagnoses    Cough    -  Primary   Relevant Medications   doxycycline (VIBRA-TABS) 100 MG tablet   hydrOXYzine (ATARAX/VISTARIL) 10 MG tablet   Other Relevant Orders   DG Chest 2 View   Vitamin D deficiency       Relevant Medications   Vitamin D,  Ergocalciferol, (DRISDOL) 1.25 MG (50000 UNIT) CAPS capsule   Pressure sensation in both ears       Relevant Medications   hydrOXYzine (ATARAX/VISTARIL) 10 MG tablet   Other Relevant Orders   Ambulatory referral to ENT   Seasonal allergic rhinitis, unspecified trigger       Relevant Medications   fluticasone (FLONASE) 50 MCG/ACT nasal spray      Meds ordered this encounter  Medications  . Vitamin D, Ergocalciferol, (DRISDOL) 1.25 MG (50000 UNIT) CAPS capsule    Sig: Take 1 capsule (50,000 Units total) by mouth every 7 (seven) days.    Dispense:  12 capsule    Refill:  0    Instructions in arabic please    Order Specific Question:   Supervising Provider    Answer:   Neva Seat, JEFFREY R [[id number]]  . doxycycline (VIBRA-TABS) 100 MG tablet    Sig: Take 1 tablet (100 mg total) by mouth 2 (two) times daily.    Dispense:  14 tablet    Refill:  0    Order Specific Question:   Supervising Provider    Answer:   Neva Seat, JEFFREY R [[id number]]  . hydrOXYzine (ATARAX/VISTARIL) 10 MG tablet    Sig: Take 1 tablet (10 mg total) by mouth 3 (three) times daily as needed.    Dispense:  30 tablet    Refill:  0    Order Specific Question:   Supervising Provider    Answer:   Neva Seat, JEFFREY R [[id number]]  . fluticasone (FLONASE) 50 MCG/ACT nasal spray    Sig: Place 2 sprays into both nostrils daily.  Dispense:  16 g    Refill:  6    Order Specific Question:   Supervising Provider    Answer:   Neva SeatGREENE, JEFFREY R [[id number]]    Follow-up: No follow-ups on file.   PLAN  Seasonal allergies - hydroxyzine 10mg  PO tid PRN, flonase, suggest OTC like cetirizine in mornings. Will order dg chest  Doxycycline to cover for odontogenic and pna if applicable. Consider albuterol inhaler with any ongoing symptoms.  Refill Vit D. Will check on levels today  Labs collected. Will follow up with the patient as warranted.  Refer to ENT for impacted cerumen. He did not tolerate lavage well in our office at last  attempt.  Continue with PT  Instructed patient to call dentistry and Dr. Gloris ManchesterAkintayo's office to schedule appointments  Patient encouraged to call clinic with any questions, comments, or concerns.  Shawn Ageeichard Charene Mccallister, NP

## 2020-12-02 NOTE — Patient Instructions (Addendum)
Mr. Carcamo -   As always, good to see you.  Please call these offices: Dentist: Dr. Leonie Man [phone] Psychiatry: Dr. Thedore Mins [phone]  Medication changes: Resume Vitamin D (ergocalciferol) 50,000 units once each week Start Hydroxyzine 10mg  by mouth before bed as needed for sleep and allergies Resume use of the nasal spray Fluticasone Start Doxycycline 100mg  by mouth twice daily for one week for the infection in your tooth. You must see Dr. (phone number listed above) for this infection soon. I cannot continue to treat this infection as it might become unsafe.  Let's plan to follow up in about 2 months. This will ensure that has plenty of time to work with you.   I will contact you if any of your labs from today return concerning.  Thank you  Rich        -           :  : Monia Pouch  : Bufford Lope   :    () [phone]        10               Fluticasone   Doxycycline 100mg             .      (   )   .            .  [phone]       .   [phone]     .                 Rich  If you have lab work done today you will be contacted with your lab results within the next 2 weeks.  If you have not heard from Marland Kitchen then please contact Bufford Lope. The fastest way to get your results is to register for My Chart.   IF you received an x-ray today, you will receive an invoice from [facility]. Please contact [facility] at [phone] with questions  or concerns regarding your invoice.   IF you received labwork today, you will receive an invoice from [facility]. Please contact [facility] at [phone] with questions or concerns regarding your invoice.   Our billing staff will not be able to assist you with questions regarding bills from these companies.  You will be contacted with the lab results as soon as they are available. The fastest way to get your results is to activate your My Chart account. Instructions are located on the last page of this paperwork. If you have not heard from [phone] regarding the results in 2 weeks, please contact this office.

## 2020-12-06 ENCOUNTER — Other Ambulatory Visit: Payer: BC Managed Care – PPO

## 2020-12-09 ENCOUNTER — Other Ambulatory Visit: Payer: BC Managed Care – PPO

## 2020-12-10 ENCOUNTER — Other Ambulatory Visit: Payer: Self-pay

## 2020-12-10 ENCOUNTER — Ambulatory Visit: Payer: BC Managed Care – PPO | Admitting: Physical Therapy

## 2020-12-10 DIAGNOSIS — M542 Cervicalgia: Secondary | ICD-10-CM

## 2020-12-10 DIAGNOSIS — R2681 Unsteadiness on feet: Secondary | ICD-10-CM

## 2020-12-10 DIAGNOSIS — R42 Dizziness and giddiness: Secondary | ICD-10-CM

## 2020-12-10 DIAGNOSIS — R2689 Other abnormalities of gait and mobility: Secondary | ICD-10-CM
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
## 2020-12-12 ENCOUNTER — Other Ambulatory Visit: Payer: BC Managed Care – PPO

## 2020-12-16 ENCOUNTER — Other Ambulatory Visit: Payer: Self-pay

## 2020-12-16 ENCOUNTER — Telehealth: Payer: Self-pay | Admitting: Registered Nurse

## 2020-12-16 ENCOUNTER — Ambulatory Visit: Payer: BC Managed Care – PPO | Admitting: Family Medicine

## 2020-12-16 DIAGNOSIS — G479 Sleep disorder, unspecified: Secondary | ICD-10-CM

## 2020-12-16 DIAGNOSIS — R42 Dizziness and giddiness: Secondary | ICD-10-CM

## 2020-12-16 DIAGNOSIS — R112 Nausea with vomiting, unspecified: Secondary | ICD-10-CM

## 2020-12-16 DIAGNOSIS — K5904 Chronic idiopathic constipation: Secondary | ICD-10-CM

## 2020-12-16 MED ORDER — TRAZODONE HCL 50 MG PO TABS: ORAL_TABLET | ORAL | 0 refills | Status: DC

## 2020-12-16 MED ORDER — SCOPOLAMINE 1 MG/3DAYS TD PT72
1.0000 | MEDICATED_PATCH | TRANSDERMAL | 12 refills | Status: DC
Start: 2020-12-16 — End: 2021-02-19

## 2020-12-16 MED ORDER — ONDANSETRON HCL 4 MG PO TABS: ORAL_TABLET | ORAL | 0 refills | Status: DC

## 2020-12-16 MED ORDER — POLYETHYLENE GLYCOL 3350 17 GM/SCOOP PO POWD: 17.0000 g | Freq: Two times a day (BID) | ORAL | 1 refills | Status: DC | PRN
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
## 2020-12-16 NOTE — Telephone Encounter (Signed)
Pt called in asking for ondansetron, polyethylene, scopolamine, trazodone, and  butalbital.   Pt uses [facility] on [address].

## 2020-12-16 NOTE — Telephone Encounter (Signed)
Pt called in asking for ondansetron, polyethylene, scopolamine, trazodone, and  butalbital.   Pt uses [facility] on [address].   Ondansetron LFD 10/08/20 #40 Scopolamine LFD 08/30/20 #10 with 12 refills (pt should still have refills) Trazodone LFD 10/08/20 # 90 Fioricet - Not on current med list

## 2020-12-16 NOTE — Telephone Encounter (Signed)
Request sent to pcp for approval. Polyethylene sent to pharmacy.

## 2020-12-17 ENCOUNTER — Ambulatory Visit: Payer: BC Managed Care – PPO | Admitting: Physical Therapy

## 2020-12-17 ENCOUNTER — Other Ambulatory Visit: Payer: Self-pay

## 2020-12-17 DIAGNOSIS — R2689 Other abnormalities of gait and mobility: Secondary | ICD-10-CM

## 2020-12-17 DIAGNOSIS — R42 Dizziness and giddiness: Secondary | ICD-10-CM

## 2020-12-17 DIAGNOSIS — R2681 Unsteadiness on feet: Secondary | ICD-10-CM

## 2020-12-18 ENCOUNTER — Other Ambulatory Visit (INDEPENDENT_AMBULATORY_CARE_PROVIDER_SITE_OTHER): Payer: BC Managed Care – PPO

## 2020-12-18 ENCOUNTER — Ambulatory Visit (INDEPENDENT_AMBULATORY_CARE_PROVIDER_SITE_OTHER): Payer: BC Managed Care – PPO

## 2020-12-18 ENCOUNTER — Other Ambulatory Visit: Payer: Self-pay | Admitting: Registered Nurse

## 2020-12-18 DIAGNOSIS — R059 Cough, unspecified: Secondary | ICD-10-CM

## 2020-12-18 DIAGNOSIS — G44329 Chronic post-traumatic headache, not intractable: Secondary | ICD-10-CM

## 2020-12-18 LAB — URINALYSIS
Bilirubin Urine: NEGATIVE
Hgb urine dipstick: NEGATIVE
Ketones, ur: NEGATIVE
Leukocytes,Ua: NEGATIVE
Nitrite: NEGATIVE
Specific Gravity, Urine: 1.015 (ref 1.000–1.030)
Total Protein, Urine: NEGATIVE
Urine Glucose: NEGATIVE
Urobilinogen, UA: 0.2 (ref 0.0–1.0)
pH: 8.5 — AB (ref 5.0–8.0)

## 2020-12-18 LAB — LIPID PANEL
Cholesterol: 205 mg/dL — ABNORMAL HIGH (ref 0–200)
HDL: 60.1 mg/dL (ref >39.00)
LDL Cholesterol: 130 mg/dL — ABNORMAL HIGH (ref 0–99)
NonHDL: 144.44
Total CHOL/HDL Ratio: 3
Triglycerides: 70 mg/dL (ref 0.0–149.0)
VLDL: 14 mg/dL (ref 0.0–40.0)

## 2020-12-18 LAB — CBC WITH DIFFERENTIAL/PLATELET
Basophils Absolute: 0 10*3/uL (ref 0.0–0.1)
Basophils Relative: 0.7 % (ref 0.0–3.0)
Eosinophils Absolute: 0.1 10*3/uL (ref 0.0–0.7)
Eosinophils Relative: 1.2 % (ref 0.0–5.0)
HCT: 42.2 % (ref 39.0–52.0)
Hemoglobin: 14.8 g/dL (ref 13.0–17.0)
Lymphocytes Relative: 35.4 % (ref 12.0–46.0)
Lymphs Abs: 1.9 10*3/uL (ref 0.7–4.0)
MCHC: 35 g/dL (ref 30.0–36.0)
MCV: 88.9 fl (ref 78.0–100.0)
Monocytes Absolute: 0.3 10*3/uL (ref 0.1–1.0)
Monocytes Relative: 5.5 % (ref 3.0–12.0)
Neutro Abs: 3.1 10*3/uL (ref 1.4–7.7)
Neutrophils Relative %: 57.2 % (ref 43.0–77.0)
Platelets: 120 10*3/uL — ABNORMAL LOW (ref 150.0–400.0)
RBC: 4.75 Mil/uL (ref 4.22–5.81)
RDW: 12.8 % (ref 11.5–15.5)
WBC: 5.4 10*3/uL (ref 4.0–10.5)

## 2020-12-18 LAB — VITAMIN D 25 HYDROXY (VIT D DEFICIENCY, FRACTURES): VITD: 20.56 ng/mL — ABNORMAL LOW (ref 30.00–100.00)

## 2020-12-18 LAB — COMPREHENSIVE METABOLIC PANEL
ALT: 22 U/L (ref 0–53)
AST: 18 U/L (ref 0–37)
Albumin: 4.4 g/dL (ref 3.5–5.2)
Alkaline Phosphatase: 45 U/L (ref 39–117)
BUN: 12 mg/dL (ref 6–23)
CO2: 30 mEq/L (ref 19–32)
Calcium: 9.5 mg/dL (ref 8.4–10.5)
Chloride: 100 mEq/L (ref 96–112)
Creatinine, Ser: 0.79 mg/dL (ref 0.40–1.50)
GFR: 110.4 mL/min (ref >60.00)
Glucose, Bld: 98 mg/dL (ref 70–99)
Potassium: 3.5 mEq/L (ref 3.5–5.1)
Sodium: 137 mEq/L (ref 135–145)
Total Bilirubin: 0.6 mg/dL (ref 0.2–1.2)
Total Protein: 7.5 g/dL (ref 6.0–8.3)

## 2020-12-18 LAB — TSH: TSH: 1.35 u[IU]/mL (ref 0.35–4.50)

## 2020-12-18 LAB — HEMOGLOBIN A1C: Hgb A1c MFr Bld: 5.6 % (ref 4.6–6.5)
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
## 2020-12-19 ENCOUNTER — Ambulatory Visit (INDEPENDENT_AMBULATORY_CARE_PROVIDER_SITE_OTHER): Payer: BC Managed Care – PPO | Admitting: Family Medicine

## 2020-12-19 VITALS — Ht 67.0 in

## 2020-12-19 DIAGNOSIS — F32A Depression, unspecified: Secondary | ICD-10-CM

## 2020-12-19 DIAGNOSIS — G479 Sleep disorder, unspecified: Secondary | ICD-10-CM

## 2020-12-19 DIAGNOSIS — H8190 Unspecified disorder of vestibular function, unspecified ear: Secondary | ICD-10-CM

## 2020-12-19 DIAGNOSIS — F0781 Postconcussional syndrome: Secondary | ICD-10-CM | POA: Diagnosis not present

## 2020-12-19 DIAGNOSIS — F447 Conversion disorder with mixed symptom presentation: Secondary | ICD-10-CM | POA: Diagnosis not present

## 2020-12-19 MED ORDER — BUPROPION HCL ER (XL) 300 MG PO TB24: 300.0000 mg | ORAL_TABLET | Freq: Every day | ORAL | 1 refills | Status: DC

## 2020-12-19 MED ORDER — TRAZODONE HCL 50 MG PO TABS: ORAL_TABLET | ORAL | 0 refills | Status: DC
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
## 2020-12-19 NOTE — Patient Instructions (Addendum)
Thank you for coming in today.  Continue physical therapy.   Recheck with me in  1 month.  30 min visit.  Request Interpreter Taous Larbes   Try to schedule the in person Neurosurgeon.    Please call these offices:   Psychiatry: Dr. Thedore Mins [phone]

## 2020-12-20 ENCOUNTER — Other Ambulatory Visit: Payer: Self-pay

## 2020-12-20 ENCOUNTER — Encounter: Payer: Self-pay | Admitting: Physical Therapy

## 2020-12-20 ENCOUNTER — Ambulatory Visit: Payer: BC Managed Care – PPO | Admitting: Physical Therapy

## 2020-12-20 ENCOUNTER — Other Ambulatory Visit: Payer: Self-pay | Admitting: Registered Nurse

## 2020-12-20 DIAGNOSIS — J40 Bronchitis, not specified as acute or chronic: Secondary | ICD-10-CM

## 2020-12-20 DIAGNOSIS — M6281 Muscle weakness (generalized): Secondary | ICD-10-CM

## 2020-12-20 DIAGNOSIS — R2681 Unsteadiness on feet: Secondary | ICD-10-CM

## 2020-12-20 DIAGNOSIS — R2689 Other abnormalities of gait and mobility: Secondary | ICD-10-CM

## 2020-12-20 DIAGNOSIS — M542 Cervicalgia: Secondary | ICD-10-CM

## 2020-12-20 DIAGNOSIS — R42 Dizziness and giddiness: Secondary | ICD-10-CM | POA: Diagnosis not present

## 2020-12-20 MED ORDER — ALBUTEROL SULFATE HFA 108 (90 BASE) MCG/ACT IN AERS: 2.0000 | INHALATION_SPRAY | Freq: Four times a day (QID) | RESPIRATORY_TRACT | 0 refills | Status: DC | PRN
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
## 2020-12-24 ENCOUNTER — Telehealth: Payer: Self-pay | Admitting: Registered Nurse

## 2020-12-24 ENCOUNTER — Other Ambulatory Visit: Payer: Self-pay

## 2020-12-24 ENCOUNTER — Ambulatory Visit: Payer: BC Managed Care – PPO | Admitting: Physical Therapy

## 2020-12-24 DIAGNOSIS — M542 Cervicalgia: Secondary | ICD-10-CM

## 2020-12-24 DIAGNOSIS — M6281 Muscle weakness (generalized): Secondary | ICD-10-CM

## 2020-12-24 DIAGNOSIS — R42 Dizziness and giddiness: Secondary | ICD-10-CM

## 2020-12-24 DIAGNOSIS — R2689 Other abnormalities of gait and mobility: Secondary | ICD-10-CM

## 2020-12-24 DIAGNOSIS — R2681 Unsteadiness on feet: Secondary | ICD-10-CM

## 2020-12-24 NOTE — Telephone Encounter (Signed)
Pt called in asking for a refill on butalbital-acetaminophen-caffeine (FIORICET) 50-325-40 MG tablet     Pt uses [facility] [address].   He wants to know about the lab results and the xray results,  Please advise
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
## 2020-12-25 ENCOUNTER — Other Ambulatory Visit: Payer: Self-pay | Admitting: Registered Nurse

## 2020-12-25 ENCOUNTER — Ambulatory Visit (INDEPENDENT_AMBULATORY_CARE_PROVIDER_SITE_OTHER): Payer: BC Managed Care – PPO | Admitting: Gastroenterology

## 2020-12-25 ENCOUNTER — Encounter: Payer: Self-pay | Admitting: Gastroenterology

## 2020-12-25 VITALS — BP 110/74 | HR 80

## 2020-12-25 DIAGNOSIS — R140 Abdominal distension (gaseous): Secondary | ICD-10-CM | POA: Diagnosis not present

## 2020-12-25 DIAGNOSIS — K5900 Constipation, unspecified: Secondary | ICD-10-CM | POA: Diagnosis not present

## 2020-12-25 DIAGNOSIS — R112 Nausea with vomiting, unspecified: Secondary | ICD-10-CM | POA: Diagnosis not present

## 2020-12-25 DIAGNOSIS — G44329 Chronic post-traumatic headache, not intractable: Secondary | ICD-10-CM

## 2020-12-25 MED ORDER — BUTALBITAL-APAP-CAFFEINE 50-325-40 MG PO TABS
1.0000 | ORAL_TABLET | Freq: Four times a day (QID) | ORAL | 0 refills | Status: DC | PRN
Start: 2020-12-25 — End: 2021-01-14

## 2020-12-25 NOTE — Patient Instructions (Signed)
If you are age 42 or younger, your body mass index should be between 19-25. Your There is no height or weight on file to calculate BMI. If this is out of the aformentioned range listed, please consider follow up with your Primary Care Provider.   __________________________________________________________  The [facility] providers would like to encourage you to use [facility] to communicate with providers for non-urgent requests or questions.  Due to long hold times on the telephone, sending your provider a message by [facility] may be a faster and more efficient way to get a response.  Please allow 48 business hours for a response.  Please remember that this is for non-urgent requests.   We have scheduled you for a CT scan.  We will call you once we get this procedure scheduled.  Due to recent changes in healthcare laws, you may see the results of your imaging and laboratory studies on MyChart before your provider has had a chance to review them.  We understand that in some cases there may be results that are confusing or concerning to you. Not all laboratory results come back in the same time frame and the provider may be waiting for multiple results in order to interpret others.  Please give [place] 48 hours in order for your provider to thoroughly review all the results before contacting the office for clarification of your results.   Thank you for entrusting me with your care and choosing [facility].  Dr Christella Hartigan
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
## 2020-12-25 NOTE — Telephone Encounter (Signed)
Sent ? ?Thanks, ? ?Rich

## 2020-12-25 NOTE — Addendum Note (Signed)
Addended by: Mariane Duval on: 12/25/2020 04:37 PM   Modules accepted: Orders

## 2020-12-27 ENCOUNTER — Other Ambulatory Visit: Payer: Self-pay

## 2020-12-27 ENCOUNTER — Ambulatory Visit: Payer: BC Managed Care – PPO | Attending: Registered Nurse | Admitting: Physical Therapy

## 2020-12-27 DIAGNOSIS — M6281 Muscle weakness (generalized): Secondary | ICD-10-CM | POA: Insufficient documentation

## 2020-12-27 DIAGNOSIS — R42 Dizziness and giddiness: Secondary | ICD-10-CM | POA: Insufficient documentation

## 2020-12-27 DIAGNOSIS — R208 Other disturbances of skin sensation: Secondary | ICD-10-CM | POA: Insufficient documentation

## 2020-12-27 DIAGNOSIS — M542 Cervicalgia: Secondary | ICD-10-CM | POA: Insufficient documentation

## 2020-12-27 DIAGNOSIS — R278 Other lack of coordination: Secondary | ICD-10-CM | POA: Insufficient documentation

## 2020-12-27 DIAGNOSIS — R2689 Other abnormalities of gait and mobility: Secondary | ICD-10-CM | POA: Insufficient documentation

## 2020-12-27 DIAGNOSIS — R2681 Unsteadiness on feet: Secondary | ICD-10-CM | POA: Diagnosis present
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
## 2020-12-31 ENCOUNTER — Other Ambulatory Visit: Payer: Self-pay

## 2020-12-31 ENCOUNTER — Ambulatory Visit (INDEPENDENT_AMBULATORY_CARE_PROVIDER_SITE_OTHER)
Admission: RE | Admit: 2020-12-31 | Discharge: 2020-12-31 | Disposition: A | Discharge status: Home / Self Care | Payer: BC Managed Care – PPO | Source: Ambulatory Visit | Attending: Gastroenterology | Admitting: Gastroenterology

## 2020-12-31 DIAGNOSIS — K5900 Constipation, unspecified: Secondary | ICD-10-CM

## 2020-12-31 DIAGNOSIS — R140 Abdominal distension (gaseous): Secondary | ICD-10-CM

## 2021-01-06 ENCOUNTER — Other Ambulatory Visit: Payer: Self-pay

## 2021-01-06 ENCOUNTER — Ambulatory Visit: Payer: BC Managed Care – PPO | Admitting: Physical Therapy

## 2021-01-06 ENCOUNTER — Encounter: Payer: Self-pay | Admitting: Physical Therapy

## 2021-01-06 DIAGNOSIS — R2681 Unsteadiness on feet: Secondary | ICD-10-CM

## 2021-01-06 DIAGNOSIS — M6281 Muscle weakness (generalized): Secondary | ICD-10-CM

## 2021-01-06 DIAGNOSIS — R2689 Other abnormalities of gait and mobility: Secondary | ICD-10-CM | POA: Diagnosis not present

## 2021-01-06 DIAGNOSIS — R42 Dizziness and giddiness: Secondary | ICD-10-CM

## 2021-01-06 DIAGNOSIS — M542 Cervicalgia: Secondary | ICD-10-CM
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
## 2021-01-06 NOTE — Patient Instructions (Signed)
[facility] Appointment:  Monday March 17, 2021 at 1:15 pm  [facility]

## 2021-01-07 ENCOUNTER — Ambulatory Visit (INDEPENDENT_AMBULATORY_CARE_PROVIDER_SITE_OTHER): Payer: BC Managed Care – PPO | Admitting: Otolaryngology

## 2021-01-07 DIAGNOSIS — R438 Other disturbances of smell and taste: Secondary | ICD-10-CM | POA: Diagnosis not present

## 2021-01-07 DIAGNOSIS — H903 Sensorineural hearing loss, bilateral: Secondary | ICD-10-CM | POA: Diagnosis not present

## 2021-01-07 DIAGNOSIS — H6122 Impacted cerumen, left ear: Secondary | ICD-10-CM
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
## 2021-01-09 ENCOUNTER — Ambulatory Visit: Payer: BC Managed Care – PPO | Admitting: Physical Therapy

## 2021-01-09 ENCOUNTER — Other Ambulatory Visit: Payer: Self-pay

## 2021-01-09 DIAGNOSIS — M6281 Muscle weakness (generalized): Secondary | ICD-10-CM

## 2021-01-09 DIAGNOSIS — M542 Cervicalgia: Secondary | ICD-10-CM

## 2021-01-09 DIAGNOSIS — R2689 Other abnormalities of gait and mobility: Secondary | ICD-10-CM

## 2021-01-09 DIAGNOSIS — R2681 Unsteadiness on feet: Secondary | ICD-10-CM

## 2021-01-09 DIAGNOSIS — R42 Dizziness and giddiness: Secondary | ICD-10-CM
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
## 2021-01-13 ENCOUNTER — Other Ambulatory Visit: Payer: Self-pay

## 2021-01-13 ENCOUNTER — Ambulatory Visit: Payer: BC Managed Care – PPO | Admitting: Physical Therapy

## 2021-01-13 DIAGNOSIS — M6281 Muscle weakness (generalized): Secondary | ICD-10-CM

## 2021-01-13 DIAGNOSIS — R42 Dizziness and giddiness: Secondary | ICD-10-CM

## 2021-01-13 DIAGNOSIS — R278 Other lack of coordination: Secondary | ICD-10-CM

## 2021-01-13 DIAGNOSIS — R2681 Unsteadiness on feet: Secondary | ICD-10-CM

## 2021-01-13 DIAGNOSIS — R2689 Other abnormalities of gait and mobility: Secondary | ICD-10-CM

## 2021-01-13 DIAGNOSIS — M542 Cervicalgia: Secondary | ICD-10-CM

## 2021-01-13 DIAGNOSIS — R208 Other disturbances of skin sensation: Secondary | ICD-10-CM

## 2021-01-13 NOTE — Therapy (Signed)
[facility] [address] [place], [place], [address] Phone: [phone]   Fax:  [phone]  Patient Details  Name: Shawn Edwards MRN: [id number] Date of Birth: 1980/09/22 Referring Provider:  Janeece Agee, NP  Encounter Date: 01/13/2021  Patient less agitated today but pt more perseverative on neck symptoms and obtaining a new xray or MRI of his cervical spine.  Utilizing in person interpreter, reviewed radiologist interpretation and report with patient from previous c-spine xray and MRI and no acute abnormalities were found.  Pt continues to insist that because of his ongoing symptoms he needs a new assessment of his hearing, his vision, and his cervical spine to help PT "know the best way to help him."  PT attempted to ensure patient that she had all necessary information to proceed with treatment and that many patients participate in therapy despite having a combination of visual, hearing, cervical spine and vestibular impairments.  PT asked pt if he needed re-assurance from these assessments before proceeding with therapy or progressing with therapy.  Pt verbalized his desire to continue with therapy while awaiting his upcoming appointment with Neuro-optometry and he desires to progress and get stronger.  PT emphasized the need to utilize therapy appointments for progressive exercises and mobility and for pt to consider his most important mobility goal at this time.  Pt verbalized understanding.  Will attempt again on Thursday to proceed with plan of care.  Dierdre Highman, PT, DPT 01/13/21    8:36 PM    [facility] [address] [place], [place], [address] Phone: [phone]   Fax:  [phone]

## 2021-01-14 ENCOUNTER — Telehealth: Payer: Self-pay | Admitting: Registered Nurse

## 2021-01-14 ENCOUNTER — Other Ambulatory Visit: Payer: Self-pay | Admitting: Registered Nurse

## 2021-01-14 ENCOUNTER — Other Ambulatory Visit: Payer: Self-pay

## 2021-01-14 DIAGNOSIS — E559 Vitamin D deficiency, unspecified: Secondary | ICD-10-CM

## 2021-01-14 DIAGNOSIS — G44329 Chronic post-traumatic headache, not intractable: Secondary | ICD-10-CM

## 2021-01-14 DIAGNOSIS — R059 Cough, unspecified: Secondary | ICD-10-CM

## 2021-01-14 DIAGNOSIS — H938X3 Other specified disorders of ear, bilateral: Secondary | ICD-10-CM

## 2021-01-14 DIAGNOSIS — F32A Depression, unspecified: Secondary | ICD-10-CM

## 2021-01-14 DIAGNOSIS — L210 Seborrhea capitis: Secondary | ICD-10-CM

## 2021-01-14 DIAGNOSIS — R112 Nausea with vomiting, unspecified: Secondary | ICD-10-CM

## 2021-01-14 MED ORDER — BUTALBITAL-APAP-CAFFEINE 50-325-40 MG PO TABS: 1.0000 | ORAL_TABLET | Freq: Four times a day (QID) | ORAL | 0 refills | Status: DC | PRN

## 2021-01-14 MED ORDER — HYDROXYZINE HCL 10 MG PO TABS: 10.0000 mg | ORAL_TABLET | Freq: Three times a day (TID) | ORAL | 0 refills | Status: DC | PRN

## 2021-01-14 MED ORDER — ALPRAZOLAM 0.5 MG PO TABS: 0.5000 mg | ORAL_TABLET | Freq: Every evening | ORAL | 0 refills | Status: DC | PRN

## 2021-01-14 MED ORDER — DOXYCYCLINE HYCLATE 100 MG PO TABS: 100.0000 mg | ORAL_TABLET | Freq: Two times a day (BID) | ORAL | 0 refills | Status: DC

## 2021-01-14 MED ORDER — VITAMIN D (ERGOCALCIFEROL) 1.25 MG (50000 UNIT) PO CAPS: 50000.0000 [IU] | ORAL_CAPSULE | ORAL | 0 refills | Status: DC

## 2021-01-14 NOTE — Telephone Encounter (Signed)
Pt  called in asking for a refill on the alprazolam, butalbital, hydroxyzine, prednisone, and vit d, and antibiotics.  Please advise

## 2021-01-14 NOTE — Telephone Encounter (Signed)
Nizoral LFD 11/14/20 #120 mL Alprazolam and Fioricet are duplicates, already sent in a med refill

## 2021-01-14 NOTE — Telephone Encounter (Signed)
Pt  called in asking for a refill on the alprazolam, butalbital, hydroxyzine, prednisone, and vit d, and antibiotics.   Please advise     Left a vm message informing patient we don't usually refill antibiotics. Informed patient on vm message that if he is not feeling well, we can get him an appointment for full assessment of his symptoms. Prednisone is not on current med list. Vitamin D is not needed as patient should not be finished with this medication. Alprazolam LFD 11/14/20 #60 Fioricet LFD 12/25/20 #14 Hydroxyzine 12/02/20 #30 Doxycycline 12/02/20 #14 LOV 12/02/20 NOV 02/03/21

## 2021-01-14 NOTE — Telephone Encounter (Signed)
LFD 12/16/20 #40 with no refills LOV 12/02/20 NOV 01/15/21

## 2021-01-14 NOTE — Telephone Encounter (Signed)
Sent to pcp for approval. Also left patient a vm message as we don't typically refill antibiotics. If patient is not feeling well, we can get him scheduled for full assessment of his symptoms.

## 2021-01-15 ENCOUNTER — Other Ambulatory Visit: Payer: Self-pay

## 2021-01-15 ENCOUNTER — Encounter: Payer: Self-pay | Admitting: Registered Nurse

## 2021-01-15 ENCOUNTER — Ambulatory Visit (INDEPENDENT_AMBULATORY_CARE_PROVIDER_SITE_OTHER): Payer: BC Managed Care – PPO | Admitting: Registered Nurse

## 2021-01-15 DIAGNOSIS — E559 Vitamin D deficiency, unspecified: Secondary | ICD-10-CM

## 2021-01-15 DIAGNOSIS — H93A9 Pulsatile tinnitus, unspecified ear: Secondary | ICD-10-CM

## 2021-01-15 DIAGNOSIS — H9193 Unspecified hearing loss, bilateral: Secondary | ICD-10-CM

## 2021-01-15 DIAGNOSIS — J40 Bronchitis, not specified as acute or chronic: Secondary | ICD-10-CM | POA: Diagnosis not present

## 2021-01-15 DIAGNOSIS — F32A Depression, unspecified: Secondary | ICD-10-CM

## 2021-01-15 DIAGNOSIS — K047 Periapical abscess without sinus: Secondary | ICD-10-CM

## 2021-01-15 DIAGNOSIS — F447 Conversion disorder with mixed symptom presentation: Secondary | ICD-10-CM

## 2021-01-15 DIAGNOSIS — R430 Anosmia: Secondary | ICD-10-CM

## 2021-01-15 MED ORDER — VITAMIN D (ERGOCALCIFEROL) 1.25 MG (50000 UNIT) PO CAPS
50000.0000 [IU] | ORAL_CAPSULE | ORAL | 0 refills | Status: DC
Start: 2021-01-15 — End: 2021-05-21

## 2021-01-15 MED ORDER — QVAR REDIHALER 80 MCG/ACT IN AERB: 1.0000 | INHALATION_SPRAY | Freq: Two times a day (BID) | RESPIRATORY_TRACT | 3 refills | Status: DC

## 2021-01-15 MED ORDER — AMOXICILLIN-POT CLAVULANATE 875-125 MG PO TABS: 1.0000 | ORAL_TABLET | Freq: Two times a day (BID) | ORAL | 0 refills | Status: DC

## 2021-01-15 MED ORDER — PREDNISONE 10 MG (21) PO TBPK: ORAL_TABLET | ORAL | 0 refills | Status: DC

## 2021-01-15 MED ORDER — BUPROPION HCL ER (XL) 300 MG PO TB24: 300.0000 mg | ORAL_TABLET | Freq: Every day | ORAL | 1 refills | Status: DC

## 2021-01-15 MED ORDER — ALBUTEROL SULFATE HFA 108 (90 BASE) MCG/ACT IN AERS: 2.0000 | INHALATION_SPRAY | Freq: Four times a day (QID) | RESPIRATORY_TRACT | 0 refills | Status: DC | PRN

## 2021-01-15 MED ORDER — SERTRALINE HCL 50 MG PO TABS: 50.0000 mg | ORAL_TABLET | Freq: Every day | ORAL | 1 refills | Status: DC

## 2021-01-15 NOTE — Progress Notes (Signed)
Established Patient Office Visit  Subjective:  Patient ID: Shawn Edwards, male    DOB: 10-Nov-1980  Age: 40 y.o. MRN: [id number]  CC:  Chief Complaint  Patient presents with   Follow-up    Patient states he is here for a follow up . Patient is still having some pain in his arms, neck, vomiting, balance issues, left side of body still feels heavy. Memory is getting worse.    HPI Shawn Edwards presents for follow up   Breathing concerns Feels congestion on exhale Rattling sound on expiration Feels he can't get a full breath in, feels he has trouble with FE1 Unfortunately was unable to pick up albuterol, has not taken Unsure if prednisone helped last time No production in cough  Memory Deficit   Medication Refill Needs refill on Vitamin D supplement Last check 12/18/20 - low at 20.32 Reports he has run out of wellbutrin, sertraline, and alprazolam  Neuropsych Has not yet seen Dr. 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   Vitamin D deficiency     Past Surgical History:  Procedure Laterality Date   AUDIOLOGICAL EVALUATION  02/28/2020       NO PAST SURGERIES      Family History  Family history unknown: Yes    Social History   Socioeconomic History   Marital status: Single    Spouse name: Not on file   Number of children: 0   Years of education: Not on file   Highest education level: Associate degree: academic program  Occupational History   Occupation: disabled  Tobacco Use   Smoking status: Never   Smokeless tobacco: Never  Vaping Use   Vaping Use: Never used  Substance and Sexual Activity   Alcohol use: Never    Drug use: Never   Sexual activity: Not on file  Other Topics Concern   Not on file  Social History Narrative   Lives alone   Right handed   Caffeine: sometimes soda   Social Determinants of Health   Financial Resource Strain: Not on file  Food Insecurity: Not on file  Transportation Needs: Not on file  Physical Activity: Not on file  Stress: Not on file  Social Connections: Not on file  Intimate Partner Violence: Not on file    Outpatient Medications Prior to Visit  Medication Sig Dispense Refill   albuterol (VENTOLIN HFA) 108 (90 Base) MCG/ACT inhaler Inhale 2 puffs into the lungs every 6 (six) hours as needed for wheezing or shortness of breath. 8 g 0   ALPRAZolam (XANAX) 0.5 MG tablet Take 1 tablet (0.5 mg total) by mouth at bedtime as needed for anxiety. 60 tablet 0   ALPRAZolam (XANAX) 0.5 MG tablet TAKE 1 TABLET(0.5 MG) BY MOUTH AT BEDTIME AS NEEDED FOR ANXIETY 60 tablet 0   amitriptyline (ELAVIL) 50 MG tablet Take 50 mg by mouth at bedtime.     buPROPion (WELLBUTRIN XL) 300 MG 24 hr tablet Take 1 tablet (300 mg total) by mouth daily. 90 tablet 1   butalbital-acetaminophen-caffeine (FIORICET) 50-325-40 MG tablet Take 1 tablet by mouth every 6 (six) hours as needed for headache. 14 tablet  0   butalbital-acetaminophen-caffeine (FIORICET) 50-325-40 MG tablet TAKE 1 TABLET BY MOUTH EVERY 6 HOURS AS NEEDED FOR HEADACHE 14 tablet 5   cetirizine (ZYRTEC) 10 MG tablet Take 1 tablet (10 mg total) by mouth daily. 30 tablet 11   cyclobenzaprine (FLEXERIL) 5 MG tablet Take 2 tablets (10 mg total) by mouth 3 (three) times daily as needed for muscle spasms. 30 tablet 2   doxycycline (VIBRA-TABS) 100 MG tablet Take 1 tablet (100 mg total) by mouth 2 (two) times daily. 14 tablet 0   fluticasone (FLONASE) 50 MCG/ACT nasal spray Place 2 sprays into both nostrils daily. 16 g 6   hydrOXYzine (ATARAX/VISTARIL) 10 MG tablet Take 1 tablet (10 mg total) by mouth 3 (three) times daily as needed. 30  tablet 0   ibuprofen (ADVIL) 600 MG tablet Take 600 mg by mouth every 6 (six) hours as needed.     ketoconazole (NIZORAL) 2 % shampoo APPLY TOPICALLY 2 TIMES A WEEK 120 mL 0   Meclizine HCl 25 MG CHEW Chew 1 tablet by mouth 3 (three) times daily.     nortriptyline (PAMELOR) 50 MG capsule Take 2 capsules (100 mg total) by mouth at bedtime. For headache 30 capsule 1   ondansetron (ZOFRAN) 4 MG tablet TAKE 1 TABLET BY MOUTH EVERY 8 HOURS AS NEEDED FOR NAUSEA 40 tablet 0   polyethylene glycol powder (GLYCOLAX/MIRALAX) 17 GM/SCOOP powder Take 17 g by mouth 2 (two) times daily as needed. 3350 g 1   scopolamine (TRANSDERM-SCOP, 1.5 MG,) 1 MG/3DAYS Place 1 patch (1.5 mg total) onto the skin every 3 (three) days. 10 patch 12   sertraline (ZOLOFT) 50 MG tablet Take 1 tablet (50 mg total) by mouth daily. 90 tablet 3   terbinafine (LAMISIL) 250 MG tablet Take 250 mg by mouth daily.     traZODone (DESYREL) 50 MG tablet TAKE 3 TABLETS(150 MG) BY MOUTH AT BEDTIME FOR SLEEP 90 tablet 0   Vitamin D, Ergocalciferol, (DRISDOL) 1.25 MG (50000 UNIT) CAPS capsule Take 1 capsule (50,000 Units total) by mouth every 7 (seven) days. 12 capsule 0   No facility-administered medications prior to visit.    Allergies  Allergen Reactions   Pork-Derived Products     ROS Review of Systems  Constitutional: Negative.   HENT: Negative.    Eyes: Negative.   Respiratory:  Positive for cough and shortness of breath. Negative for apnea, choking, chest tightness, wheezing and stridor.   Cardiovascular: Negative.   Gastrointestinal: Negative.   Endocrine: Negative.   Genitourinary: Negative.   Musculoskeletal: Negative.   Skin: Negative.   Allergic/Immunologic: Negative.   Neurological: Negative.   Hematological: Negative.   Psychiatric/Behavioral: Negative.    All other systems reviewed and are negative.    Objective:    Physical Exam Constitutional:      General: He is not in acute distress.    Appearance:  Normal appearance. He is normal weight. He is not ill-appearing, toxic-appearing or diaphoretic.  Cardiovascular:     Rate and Rhythm: Normal rate and regular rhythm.     Heart sounds: Normal heart sounds. No murmur heard.   No friction rub. No gallop.  Pulmonary:     Effort: Pulmonary effort is normal. No respiratory distress.     Breath sounds: Normal breath sounds. No stridor. No wheezing, rhonchi or rales.  Chest:     Chest wall: No tenderness.  Neurological:     General: No focal deficit present.     Mental Status: He  is alert and oriented to person, place, and time. Mental status is at baseline.  Psychiatric:        Mood and Affect: Mood normal.        Behavior: Behavior normal.        Thought Content: Thought content normal.        Judgment: Judgment normal.    There were no vitals taken for this visit. Wt Readings from Last 3 Encounters:  05/16/20 151 lb (68.5 kg)  02/27/20 151 lb (68.5 kg)  02/16/20 151 lb (68.5 kg)     There are no preventive care reminders to display for this patient.  There are no preventive care reminders to display for this patient.  Lab Results  Component Value Date   TSH 1.35 12/18/2020   Lab Results  Component Value Date   WBC 5.4 12/18/2020   HGB 14.8 12/18/2020   HCT 42.2 12/18/2020   MCV 88.9 12/18/2020   PLT 120.0 (L) 12/18/2020   Lab Results  Component Value Date   NA 137 12/18/2020   K 3.5 12/18/2020   CO2 30 12/18/2020   GLUCOSE 98 12/18/2020   BUN 12 12/18/2020   CREATININE 0.79 12/18/2020   BILITOT 0.6 12/18/2020   ALKPHOS 45 12/18/2020   AST 18 12/18/2020   ALT 22 12/18/2020   PROT 7.5 12/18/2020   ALBUMIN 4.4 12/18/2020   CALCIUM 9.5 12/18/2020   ANIONGAP 11 02/05/2019   GFR 110.40 12/18/2020   Lab Results  Component Value Date   CHOL 205 (H) 12/18/2020   Lab Results  Component Value Date   HDL 60.10 12/18/2020   Lab Results  Component Value Date   LDLCALC 130 (H) 12/18/2020   Lab Results   Component Value Date   TRIG 70.0 12/18/2020   Lab Results  Component Value Date   CHOLHDL 3 12/18/2020   Lab Results  Component Value Date   HGBA1C 5.6 12/18/2020      Assessment & Plan:   Problem List Items Addressed This Visit   None   No orders of the defined types were placed in this encounter.   Follow-up: No follow-ups on file.   PLAN Refer to second opinion on ENT for smell and hearing. Refer to [facility] audiology Again provided with Dr. Gloris Manchester contact information. I think this is crucial step to his recovery Reviewed last labs and imaging Given recent pulsatile tinnitus at therapy will refer to vascular being that his neuro work up thus far has been negative. Refer to pulmonology per pt request for wheezing, suspected asthma. Med refills as above Follow up q3 mo, sooner if necessary. Patient encouraged to call clinic with any questions, comments, or concerns. Janeece Agee, NP

## 2021-01-15 NOTE — Patient Instructions (Addendum)
Shawn Edwards -   Randie Heinz to see you. Thanks to you and Taous for coming here.  Referrals have been placed for: Pulmonology ENT (smell) Audiology (Hearing) Vascular (tinnitus - pulses in ear)  I have refilled medication  I have sent a new inhaler to use once daily  See you within the next 3 months or so to check in  Thank you  Rich     If you have lab work done today you will be contacted with your lab results within the next 2 weeks.  If you have not heard from [place] then please contact us. The fastest way to get your results is to register for My Chart.   IF you received an x-ray today, you will receive an invoice from [facility] Radiology. Please contact [facility] Radiology at [phone] with questions or concerns regarding your invoice.   IF you received labwork today, you will receive an invoice from [facility]. Please contact [facility] at [phone] with questions or concerns regarding your invoice.   Our billing staff will not be able to assist you with questions regarding bills from these companies.  You will be contacted with the lab results as soon as they are available. The fastest way to get your results is to activate your My Chart account. Instructions are located on the last page of this paperwork. If you have not heard from [place] regarding the results in 2 weeks, please contact this office.

## 2021-01-16 ENCOUNTER — Ambulatory Visit: Payer: BC Managed Care – PPO | Admitting: Physical Therapy

## 2021-01-16 DIAGNOSIS — M542 Cervicalgia: Secondary | ICD-10-CM

## 2021-01-16 DIAGNOSIS — M6281 Muscle weakness (generalized): Secondary | ICD-10-CM

## 2021-01-16 DIAGNOSIS — R2681 Unsteadiness on feet: Secondary | ICD-10-CM

## 2021-01-16 DIAGNOSIS — R42 Dizziness and giddiness: Secondary | ICD-10-CM

## 2021-01-16 DIAGNOSIS — R2689 Other abnormalities of gait and mobility: Secondary | ICD-10-CM | POA: Diagnosis not present
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
## 2021-01-20 ENCOUNTER — Ambulatory Visit: Payer: BC Managed Care – PPO | Admitting: Physical Therapy

## 2021-01-20 ENCOUNTER — Other Ambulatory Visit: Payer: Self-pay

## 2021-01-20 DIAGNOSIS — R2689 Other abnormalities of gait and mobility: Secondary | ICD-10-CM

## 2021-01-20 DIAGNOSIS — R2681 Unsteadiness on feet: Secondary | ICD-10-CM

## 2021-01-20 DIAGNOSIS — M542 Cervicalgia: Secondary | ICD-10-CM

## 2021-01-20 DIAGNOSIS — M6281 Muscle weakness (generalized): Secondary | ICD-10-CM

## 2021-01-20 DIAGNOSIS — R42 Dizziness and giddiness: Secondary | ICD-10-CM
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
## 2021-01-23 ENCOUNTER — Ambulatory Visit: Payer: BC Managed Care – PPO | Admitting: Family Medicine

## 2021-01-23 ENCOUNTER — Ambulatory Visit: Payer: BC Managed Care – PPO | Admitting: Physical Therapy

## 2021-02-03 ENCOUNTER — Ambulatory Visit: Payer: BC Managed Care – PPO | Admitting: Registered Nurse

## 2021-02-04 ENCOUNTER — Ambulatory Visit: Payer: BC Managed Care – PPO | Attending: Registered Nurse | Admitting: Physical Therapy

## 2021-02-04 ENCOUNTER — Other Ambulatory Visit: Payer: Self-pay

## 2021-02-04 DIAGNOSIS — R2681 Unsteadiness on feet: Secondary | ICD-10-CM

## 2021-02-04 DIAGNOSIS — R42 Dizziness and giddiness: Secondary | ICD-10-CM | POA: Diagnosis present

## 2021-02-04 DIAGNOSIS — R208 Other disturbances of skin sensation: Secondary | ICD-10-CM | POA: Diagnosis present

## 2021-02-04 DIAGNOSIS — R2689 Other abnormalities of gait and mobility: Secondary | ICD-10-CM

## 2021-02-04 DIAGNOSIS — R278 Other lack of coordination: Secondary | ICD-10-CM | POA: Diagnosis present

## 2021-02-04 DIAGNOSIS — M542 Cervicalgia: Secondary | ICD-10-CM

## 2021-02-04 DIAGNOSIS — M6281 Muscle weakness (generalized): Secondary | ICD-10-CM
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
## 2021-02-06 ENCOUNTER — Ambulatory Visit: Payer: BC Managed Care – PPO | Admitting: Physical Therapy

## 2021-02-06 ENCOUNTER — Other Ambulatory Visit: Payer: Self-pay

## 2021-02-06 DIAGNOSIS — R278 Other lack of coordination: Secondary | ICD-10-CM

## 2021-02-06 DIAGNOSIS — R208 Other disturbances of skin sensation: Secondary | ICD-10-CM

## 2021-02-06 DIAGNOSIS — M542 Cervicalgia: Secondary | ICD-10-CM

## 2021-02-06 DIAGNOSIS — R2681 Unsteadiness on feet: Secondary | ICD-10-CM

## 2021-02-06 DIAGNOSIS — R2689 Other abnormalities of gait and mobility: Secondary | ICD-10-CM

## 2021-02-06 DIAGNOSIS — M6281 Muscle weakness (generalized): Secondary | ICD-10-CM

## 2021-02-06 DIAGNOSIS — R42 Dizziness and giddiness: Secondary | ICD-10-CM
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
## 2021-02-12 ENCOUNTER — Ambulatory Visit: Payer: BC Managed Care – PPO | Admitting: Registered Nurse
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
## 2021-02-13 ENCOUNTER — Ambulatory Visit: Payer: BC Managed Care – PPO | Admitting: Family Medicine

## 2021-02-14 ENCOUNTER — Other Ambulatory Visit: Payer: Self-pay

## 2021-02-14 ENCOUNTER — Encounter: Payer: Self-pay | Admitting: Registered Nurse

## 2021-02-14 ENCOUNTER — Ambulatory Visit (INDEPENDENT_AMBULATORY_CARE_PROVIDER_SITE_OTHER): Payer: BC Managed Care – PPO | Admitting: Registered Nurse

## 2021-02-14 DIAGNOSIS — R1114 Bilious vomiting: Secondary | ICD-10-CM

## 2021-02-14 DIAGNOSIS — F32A Depression, unspecified: Secondary | ICD-10-CM

## 2021-02-14 DIAGNOSIS — G479 Sleep disorder, unspecified: Secondary | ICD-10-CM

## 2021-02-14 DIAGNOSIS — I6529 Occlusion and stenosis of unspecified carotid artery: Secondary | ICD-10-CM

## 2021-02-14 DIAGNOSIS — E559 Vitamin D deficiency, unspecified: Secondary | ICD-10-CM | POA: Diagnosis not present

## 2021-02-14 DIAGNOSIS — M6281 Muscle weakness (generalized): Secondary | ICD-10-CM | POA: Diagnosis not present

## 2021-02-14 DIAGNOSIS — K148 Other diseases of tongue: Secondary | ICD-10-CM

## 2021-02-14 LAB — CBC WITH DIFFERENTIAL/PLATELET
Basophils Absolute: 0 10*3/uL (ref 0.0–0.1)
Basophils Relative: 0.6 % (ref 0.0–3.0)
Eosinophils Absolute: 0 10*3/uL (ref 0.0–0.7)
Eosinophils Relative: 0.8 % (ref 0.0–5.0)
HCT: 44.5 % (ref 39.0–52.0)
Hemoglobin: 15.2 g/dL (ref 13.0–17.0)
Lymphocytes Relative: 34.2 % (ref 12.0–46.0)
Lymphs Abs: 1.7 10*3/uL (ref 0.7–4.0)
MCHC: 34.2 g/dL (ref 30.0–36.0)
MCV: 90.1 fl (ref 78.0–100.0)
Monocytes Absolute: 0.3 10*3/uL (ref 0.1–1.0)
Monocytes Relative: 6.2 % (ref 3.0–12.0)
Neutro Abs: 2.9 10*3/uL (ref 1.4–7.7)
Neutrophils Relative %: 58.2 % (ref 43.0–77.0)
Platelets: 162 10*3/uL (ref 150.0–400.0)
RBC: 4.95 Mil/uL (ref 4.22–5.81)
RDW: 13.2 % (ref 11.5–15.5)
WBC: 5 10*3/uL (ref 4.0–10.5)

## 2021-02-14 LAB — LIPID PANEL
Cholesterol: 203 mg/dL — ABNORMAL HIGH (ref 0–200)
HDL: 62.8 mg/dL (ref >39.00)
LDL Cholesterol: 124 mg/dL — ABNORMAL HIGH (ref 0–99)
NonHDL: 140.41
Total CHOL/HDL Ratio: 3
Triglycerides: 81 mg/dL (ref 0.0–149.0)
VLDL: 16.2 mg/dL (ref 0.0–40.0)

## 2021-02-14 LAB — TESTOSTERONE: Testosterone: 422.78 ng/dL (ref 300.00–890.00)

## 2021-02-14 LAB — COMPREHENSIVE METABOLIC PANEL
ALT: 16 U/L (ref 0–53)
AST: 15 U/L (ref 0–37)
Albumin: 4.5 g/dL (ref 3.5–5.2)
Alkaline Phosphatase: 44 U/L (ref 39–117)
BUN: 14 mg/dL (ref 6–23)
CO2: 29 mEq/L (ref 19–32)
Calcium: 9.1 mg/dL (ref 8.4–10.5)
Chloride: 100 mEq/L (ref 96–112)
Creatinine, Ser: 0.69 mg/dL (ref 0.40–1.50)
GFR: 114.88 mL/min (ref >60.00)
Glucose, Bld: 83 mg/dL (ref 70–99)
Potassium: 3.4 mEq/L — ABNORMAL LOW (ref 3.5–5.1)
Sodium: 138 mEq/L (ref 135–145)
Total Bilirubin: 0.9 mg/dL (ref 0.2–1.2)
Total Protein: 7.2 g/dL (ref 6.0–8.3)

## 2021-02-14 LAB — CK: Total CK: 89 U/L (ref 7–232)

## 2021-02-14 LAB — VITAMIN D 25 HYDROXY (VIT D DEFICIENCY, FRACTURES): VITD: 30.04 ng/mL (ref 30.00–100.00)

## 2021-02-14 LAB — TSH: TSH: 1.3 u[IU]/mL (ref 0.35–5.50)

## 2021-02-14 MED ORDER — SERTRALINE HCL 100 MG PO TABS: 100.0000 mg | ORAL_TABLET | Freq: Every day | ORAL | 1 refills | Status: DC

## 2021-02-14 MED ORDER — TRAZODONE HCL 150 MG PO TABS: ORAL_TABLET | ORAL | 1 refills | Status: DC

## 2021-02-14 NOTE — Patient Instructions (Addendum)
Shawn Edwards -   Shawn Edwards to see you -  Labs today: we are checking on a variety of labs that would check on any possible cause or contributor to symptoms. I will call you if they are abnormal.  Increase sertraline to 100mg  daily. Maintain other medications  Continue on trazodone for sleep.   Shawn Edwards -   See you in 2-3 months  Thank you  Shawn Edwards     If you have lab work done today you will be contacted with your lab results within the next 2 weeks.  If you have not heard from then please contact us. The fastest way to get your results is to register for My Chart.   IF you received an x-ray today, you will receive an invoice from [facility]. Please contact [facility] at [phone] with questions or concerns regarding your invoice.   IF you received labwork today, you will receive an invoice from [facility]. Please contact [facility] at [phone] with questions or concerns regarding your invoice.   Our billing staff will not be able to assist you with questions regarding bills from these companies.  You will be contacted with the lab results as soon as they are available. The fastest way to get your results is to activate your My Chart account. Instructions are located on the last page of this paperwork. If you have not heard from [phone] regarding the results in 2 weeks, please contact this office.

## 2021-02-15 LAB — IGA: Immunoglobulin A: 232 mg/dL (ref 47–310)

## 2021-02-17 ENCOUNTER — Ambulatory Visit: Payer: BC Managed Care – PPO | Admitting: Audiology

## 2021-02-17 LAB — ANA W/REFLEX: Anti Nuclear Antibody (ANA): NEGATIVE

## 2021-02-18 ENCOUNTER — Ambulatory Visit: Payer: BC Managed Care – PPO | Admitting: Physical Therapy

## 2021-02-19 ENCOUNTER — Telehealth: Payer: Self-pay

## 2021-02-19 ENCOUNTER — Other Ambulatory Visit: Payer: Self-pay

## 2021-02-19 DIAGNOSIS — G44329 Chronic post-traumatic headache, not intractable: Secondary | ICD-10-CM

## 2021-02-19 DIAGNOSIS — G479 Sleep disorder, unspecified: Secondary | ICD-10-CM

## 2021-02-19 DIAGNOSIS — R42 Dizziness and giddiness: Secondary | ICD-10-CM

## 2021-02-19 DIAGNOSIS — F32A Depression, unspecified: Secondary | ICD-10-CM

## 2021-02-19 MED ORDER — SCOPOLAMINE 1 MG/3DAYS TD PT72
1.0000 | MEDICATED_PATCH | TRANSDERMAL | 12 refills | Status: DC
Start: 2021-02-19 — End: 2021-06-25

## 2021-02-19 MED ORDER — NORTRIPTYLINE HCL 50 MG PO CAPS: 100.0000 mg | ORAL_CAPSULE | Freq: Every day | ORAL | 1 refills | Status: DC

## 2021-02-19 MED ORDER — ALPRAZOLAM 0.5 MG PO TABS: 0.5000 mg | ORAL_TABLET | Freq: Every evening | ORAL | 0 refills | Status: DC | PRN

## 2021-02-19 NOTE — Telephone Encounter (Signed)
Sent to provider for approval

## 2021-02-19 NOTE — Telephone Encounter (Signed)
Pt needs ondansatron 4mg  for nausea and vomiting  nortriptyline (PAMELOR) 50 MG capsule  scopolamine (TRANSDERM-SCOP, 1.5 MG,) 1 MG/3DAYS  ALPRAZolam (XANAX) 0.5 MG tablet   [id number], [place] - [address] AT [facility] OF [facility] [address]  [address], [place]  Nortriptyline LFD 08/30/20 #30 with 1 refill Scopolamine patches LFD 12/16/20  #10 with 12 refills, patient should have refills on file Alprazolam LFD 01/14/21 #60 with no refills 01/16/21 is not on patient's med list, please advise LOV 02/14/21 NOV 03/14/21

## 2021-02-19 NOTE — Telephone Encounter (Signed)
Pt needs ondansatron 4mg  for nausea and vomiting  nortriptyline (PAMELOR) 50 MG capsule  scopolamine (TRANSDERM-SCOP, 1.5 MG,) 1 MG/3DAYS  ALPRAZolam (XANAX) 0.5 MG tablet   [id number], [place] - [address] AT [facility] OF [facility] [address]  [address] [place], [place]

## 2021-02-20 ENCOUNTER — Other Ambulatory Visit: Payer: Self-pay

## 2021-02-20 ENCOUNTER — Ambulatory Visit: Payer: BC Managed Care – PPO | Admitting: Audiology

## 2021-02-20 ENCOUNTER — Other Ambulatory Visit: Payer: Self-pay | Admitting: Registered Nurse

## 2021-02-20 ENCOUNTER — Ambulatory Visit: Payer: BC Managed Care – PPO | Admitting: Physical Therapy

## 2021-02-20 DIAGNOSIS — M542 Cervicalgia: Secondary | ICD-10-CM

## 2021-02-20 DIAGNOSIS — M6281 Muscle weakness (generalized): Secondary | ICD-10-CM

## 2021-02-20 DIAGNOSIS — R42 Dizziness and giddiness: Secondary | ICD-10-CM

## 2021-02-20 DIAGNOSIS — R2681 Unsteadiness on feet: Secondary | ICD-10-CM | POA: Diagnosis not present

## 2021-02-20 DIAGNOSIS — K047 Periapical abscess without sinus: Secondary | ICD-10-CM

## 2021-02-20 DIAGNOSIS — R208 Other disturbances of skin sensation: Secondary | ICD-10-CM

## 2021-02-20 DIAGNOSIS — R2689 Other abnormalities of gait and mobility: Secondary | ICD-10-CM

## 2021-02-20 LAB — VITAMIN B1: Vitamin B1 (Thiamine): 10 nmol/L (ref 8–30)
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
## 2021-02-20 NOTE — Progress Notes (Deleted)
An in person interpretor was scheduled for [facility] hearing test but canceled last minute. No  [place] Arabic interpretors were available, by phone or in person for his appointment.   Patient was called using a regular dialect Arabic interpretor, he said he could not understand them due to his hearing loss and the incorrect dialect. He asked [place] to call a friend. His friend's contact information is in his chart. This friend was called a voice mail was left. The message said Jasmon will need to be seen for a hearing test with an in person interpretor to increase the reliability of his test results due to poor reliability and significant difficulty conveying testing instructions on last evaluation, today's appointment is canceled. Audiology will call the patient to reschedule once we know the in person moroccan interpretor is available, or at the very least an audio only person.   Ammie Ferrier Au.D. CCC-A Audiologist

## 2021-02-20 NOTE — Procedures (Deleted)
An in person interpretor was scheduled for Shawn Edwards hearing test but canceled last minute. No  [place] Arabic interpretors were available, by phone or in person for his appointment.   Patient was called using a regular dialect Arabic interpretor, he said he could not understand them due to his hearing loss and the incorrect dialect. He asked us to call a friend. His friend's contact information is in his chart. This friend was called a voice mail was left. The message said Draxton will need to be seen for a hearing test with an in person interpretor to increase the reliability of his test results due to poor reliability and significant difficulty conveying testing instructions on last evaluation, today's appointment is canceled. Audiology will call the patient to reschedule once we know the in person moroccan interpretor is available, or at the very least an audio only person.   Emalea Mix Au.D. CCC-A Audiologist   

## 2021-02-21 ENCOUNTER — Ambulatory Visit (HOSPITAL_COMMUNITY)
Admission: RE | Admit: 2021-02-21 | Discharge: 2021-02-21 | Disposition: A | Discharge status: Home / Self Care | Payer: BC Managed Care – PPO | Source: Ambulatory Visit | Attending: Vascular Surgery | Admitting: Vascular Surgery

## 2021-02-21 ENCOUNTER — Ambulatory Visit (INDEPENDENT_AMBULATORY_CARE_PROVIDER_SITE_OTHER): Payer: BC Managed Care – PPO | Admitting: Vascular Surgery

## 2021-02-21 ENCOUNTER — Encounter: Payer: Self-pay | Admitting: Vascular Surgery

## 2021-02-21 VITALS — BP 129/82 | HR 75 | Temp 98.1°F | Resp 16 | Ht 68.0 in

## 2021-02-21 DIAGNOSIS — I6529 Occlusion and stenosis of unspecified carotid artery: Secondary | ICD-10-CM | POA: Insufficient documentation

## 2021-02-21 DIAGNOSIS — H93A3 Pulsatile tinnitus, bilateral: Secondary | ICD-10-CM
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
## 2021-02-24 ENCOUNTER — Other Ambulatory Visit: Payer: Self-pay

## 2021-02-24 ENCOUNTER — Ambulatory Visit: Payer: BC Managed Care – PPO | Attending: Registered Nurse | Admitting: Physical Therapy

## 2021-02-24 ENCOUNTER — Telehealth: Payer: Self-pay

## 2021-02-24 DIAGNOSIS — R2681 Unsteadiness on feet: Secondary | ICD-10-CM | POA: Insufficient documentation

## 2021-02-24 DIAGNOSIS — H918X3 Other specified hearing loss, bilateral: Secondary | ICD-10-CM | POA: Insufficient documentation

## 2021-02-24 DIAGNOSIS — H93233 Hyperacusis, bilateral: Secondary | ICD-10-CM | POA: Insufficient documentation

## 2021-02-24 DIAGNOSIS — M6281 Muscle weakness (generalized): Secondary | ICD-10-CM | POA: Diagnosis present

## 2021-02-24 DIAGNOSIS — R208 Other disturbances of skin sensation: Secondary | ICD-10-CM | POA: Diagnosis present

## 2021-02-24 DIAGNOSIS — R278 Other lack of coordination: Secondary | ICD-10-CM | POA: Insufficient documentation

## 2021-02-24 DIAGNOSIS — M542 Cervicalgia: Secondary | ICD-10-CM | POA: Diagnosis present

## 2021-02-24 DIAGNOSIS — R2689 Other abnormalities of gait and mobility: Secondary | ICD-10-CM | POA: Diagnosis present

## 2021-02-24 DIAGNOSIS — H9313 Tinnitus, bilateral: Secondary | ICD-10-CM | POA: Diagnosis present

## 2021-02-24 DIAGNOSIS — F447 Conversion disorder with mixed symptom presentation: Secondary | ICD-10-CM | POA: Insufficient documentation

## 2021-02-24 DIAGNOSIS — R42 Dizziness and giddiness: Secondary | ICD-10-CM | POA: Diagnosis present

## 2021-02-24 NOTE — Telephone Encounter (Signed)
Left a voicemail message informing patiet that the providers don't normally refill antibiotics. When patient calls back, we need to know whyt he is asking for this antibiotic.

## 2021-02-24 NOTE — Telephone Encounter (Signed)
Pt needs refill on omnicef 4 mg sent to the pharmacy [id number] - [place],  - [address] AT [facility] [address]   Pt call back [phone]
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
## 2021-02-25 ENCOUNTER — Ambulatory Visit: Payer: BC Managed Care – PPO | Admitting: Physical Therapy

## 2021-02-25 NOTE — Telephone Encounter (Signed)
Can send ondansetron. Will not send antibiotics as he must establish with community dentist. We should push his appointment out a month if possible. There is nothing I can do for him sooner. He needs to establish with Dr. Thedore Mins, a community dentist, and continue physical therapy.  Thank you  Rich

## 2021-02-25 NOTE — Telephone Encounter (Signed)
Patient needs ondansetron - Patient needs this sent to [facility] [address] - [employer]
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
## 2021-02-25 NOTE — Telephone Encounter (Signed)
Last seen 02/14/21. Patient has an appointment 03/14/21. Ondansetron is not on patient's current medication list but it is in his history. Please advise.

## 2021-02-26 ENCOUNTER — Ambulatory Visit (INDEPENDENT_AMBULATORY_CARE_PROVIDER_SITE_OTHER): Payer: BC Managed Care – PPO | Admitting: Family Medicine

## 2021-02-26 ENCOUNTER — Other Ambulatory Visit: Payer: Self-pay

## 2021-02-26 VITALS — Ht 68.0 in

## 2021-02-26 DIAGNOSIS — H8190 Unspecified disorder of vestibular function, unspecified ear: Secondary | ICD-10-CM

## 2021-02-26 DIAGNOSIS — F447 Conversion disorder with mixed symptom presentation: Secondary | ICD-10-CM

## 2021-02-26 DIAGNOSIS — F0781 Postconcussional syndrome: Secondary | ICD-10-CM

## 2021-02-26 MED ORDER — MECLIZINE HCL 25 MG PO TABS: 25.0000 mg | ORAL_TABLET | Freq: Three times a day (TID) | ORAL | 3 refills | Status: DC | PRN

## 2021-02-26 NOTE — Patient Instructions (Addendum)
Thank you for coming in today.   Decrease the trazodone for sleep to 1 pill.   Decrease the nortriptyline for headache at bedtime to 1 pill.   Recheck with me 1 month.   I will talk with your doctor Kateri Plummer and see if we can get an appointment with specialists  Ok to see physical therapy.   Schedule with same translator.

## 2021-02-26 NOTE — Telephone Encounter (Signed)
I have rescheduled the appt to Monday 09/19 at 1:50. Also the patient said that the medication for the ondansetron is ok   Pt call back [phone]

## 2021-02-26 NOTE — Telephone Encounter (Signed)
Left a voice mail message informing patient of provider recommendations. 

## 2021-02-27 ENCOUNTER — Ambulatory Visit: Payer: BC Managed Care – PPO | Admitting: Physical Therapy

## 2021-02-27 DIAGNOSIS — R2681 Unsteadiness on feet: Secondary | ICD-10-CM | POA: Diagnosis not present

## 2021-02-27 DIAGNOSIS — R42 Dizziness and giddiness: Secondary | ICD-10-CM

## 2021-02-27 DIAGNOSIS — R208 Other disturbances of skin sensation: Secondary | ICD-10-CM

## 2021-02-27 DIAGNOSIS — M542 Cervicalgia: Secondary | ICD-10-CM

## 2021-02-27 DIAGNOSIS — M6281 Muscle weakness (generalized): Secondary | ICD-10-CM

## 2021-02-27 DIAGNOSIS — R2689 Other abnormalities of gait and mobility: Secondary | ICD-10-CM

## 2021-02-27 DIAGNOSIS — R278 Other lack of coordination: Secondary | ICD-10-CM
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
## 2021-03-03 ENCOUNTER — Ambulatory Visit: Payer: BC Managed Care – PPO | Admitting: Physical Therapy

## 2021-03-03 ENCOUNTER — Other Ambulatory Visit: Payer: Self-pay

## 2021-03-03 VITALS — BP 127/91 | HR 87 | Temp 99.1°F

## 2021-03-03 DIAGNOSIS — R2681 Unsteadiness on feet: Secondary | ICD-10-CM | POA: Diagnosis not present

## 2021-03-03 DIAGNOSIS — R208 Other disturbances of skin sensation: Secondary | ICD-10-CM

## 2021-03-03 DIAGNOSIS — R278 Other lack of coordination: Secondary | ICD-10-CM

## 2021-03-03 DIAGNOSIS — R2689 Other abnormalities of gait and mobility: Secondary | ICD-10-CM

## 2021-03-03 DIAGNOSIS — R42 Dizziness and giddiness: Secondary | ICD-10-CM

## 2021-03-03 DIAGNOSIS — M6281 Muscle weakness (generalized): Secondary | ICD-10-CM
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
## 2021-03-05 ENCOUNTER — Ambulatory Visit: Payer: BC Managed Care – PPO | Admitting: Registered Nurse

## 2021-03-06 ENCOUNTER — Ambulatory Visit: Payer: BC Managed Care – PPO | Admitting: Physical Therapy

## 2021-03-06 ENCOUNTER — Ambulatory Visit: Payer: BC Managed Care – PPO | Admitting: Audiologist

## 2021-03-06 ENCOUNTER — Other Ambulatory Visit: Payer: Self-pay

## 2021-03-06 DIAGNOSIS — H9313 Tinnitus, bilateral: Secondary | ICD-10-CM

## 2021-03-06 DIAGNOSIS — H93233 Hyperacusis, bilateral: Secondary | ICD-10-CM

## 2021-03-06 DIAGNOSIS — H918X3 Other specified hearing loss, bilateral: Secondary | ICD-10-CM

## 2021-03-06 DIAGNOSIS — R2681 Unsteadiness on feet: Secondary | ICD-10-CM | POA: Diagnosis not present

## 2021-03-06 DIAGNOSIS — F447 Conversion disorder with mixed symptom presentation: Secondary | ICD-10-CM
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
## 2021-03-10 ENCOUNTER — Ambulatory Visit: Payer: BC Managed Care – PPO | Admitting: Physical Therapy

## 2021-03-10 ENCOUNTER — Other Ambulatory Visit: Payer: Self-pay

## 2021-03-10 DIAGNOSIS — R2689 Other abnormalities of gait and mobility: Secondary | ICD-10-CM

## 2021-03-10 DIAGNOSIS — M6281 Muscle weakness (generalized): Secondary | ICD-10-CM

## 2021-03-10 DIAGNOSIS — R278 Other lack of coordination: Secondary | ICD-10-CM

## 2021-03-10 DIAGNOSIS — R208 Other disturbances of skin sensation: Secondary | ICD-10-CM

## 2021-03-10 DIAGNOSIS — R42 Dizziness and giddiness: Secondary | ICD-10-CM

## 2021-03-10 DIAGNOSIS — R2681 Unsteadiness on feet: Secondary | ICD-10-CM

## 2021-03-10 DIAGNOSIS — M542 Cervicalgia: Secondary | ICD-10-CM
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
## 2021-03-13 ENCOUNTER — Ambulatory Visit: Payer: BC Managed Care – PPO | Admitting: Physical Therapy

## 2021-03-13 ENCOUNTER — Other Ambulatory Visit: Payer: Self-pay

## 2021-03-13 DIAGNOSIS — R2689 Other abnormalities of gait and mobility: Secondary | ICD-10-CM

## 2021-03-13 DIAGNOSIS — R2681 Unsteadiness on feet: Secondary | ICD-10-CM | POA: Diagnosis not present

## 2021-03-13 DIAGNOSIS — R42 Dizziness and giddiness: Secondary | ICD-10-CM

## 2021-03-13 DIAGNOSIS — M6281 Muscle weakness (generalized): Secondary | ICD-10-CM

## 2021-03-13 DIAGNOSIS — M542 Cervicalgia: Secondary | ICD-10-CM

## 2021-03-13 DIAGNOSIS — R208 Other disturbances of skin sensation: Secondary | ICD-10-CM
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
## 2021-03-14 ENCOUNTER — Encounter: Payer: Self-pay | Admitting: Registered Nurse

## 2021-03-14 ENCOUNTER — Ambulatory Visit: Payer: BC Managed Care – PPO | Admitting: Registered Nurse

## 2021-03-14 ENCOUNTER — Ambulatory Visit (INDEPENDENT_AMBULATORY_CARE_PROVIDER_SITE_OTHER): Payer: BC Managed Care – PPO | Admitting: Registered Nurse

## 2021-03-14 ENCOUNTER — Other Ambulatory Visit: Payer: Self-pay

## 2021-03-14 VITALS — BP 121/86 | HR 87 | Temp 97.9°F | Resp 18 | Ht 68.0 in

## 2021-03-14 DIAGNOSIS — F32A Depression, unspecified: Secondary | ICD-10-CM

## 2021-03-14 DIAGNOSIS — K047 Periapical abscess without sinus: Secondary | ICD-10-CM

## 2021-03-14 DIAGNOSIS — Q386 Other congenital malformations of mouth: Secondary | ICD-10-CM | POA: Diagnosis not present

## 2021-03-14 DIAGNOSIS — R430 Anosmia: Secondary | ICD-10-CM | POA: Diagnosis not present

## 2021-03-14 DIAGNOSIS — R059 Cough, unspecified: Secondary | ICD-10-CM

## 2021-03-14 DIAGNOSIS — H9193 Unspecified hearing loss, bilateral: Secondary | ICD-10-CM | POA: Diagnosis not present

## 2021-03-14 NOTE — Progress Notes (Addendum)
Established Patient Office Visit  Subjective:  Patient ID: Shawn Edwards, male    DOB: 1980/12/08  Age: 40 y.o. MRN: [id number]  CC:  Chief Complaint  Patient presents with   Follow-up    Patient states he is here for a follow up. He his still having some dizziness, pain in neck, balance. Patient states he do not think he is getting better with balance yet.    HPI Shawn Edwards presents for follow up   Unfortunately he continues to feel as though he has not had improvement Still a lot of trouble with balance.  Still having ongoing neck pain.   Since last seeing him he has seen Dr. Denyse Amass in sports med who had adjusted some meds with concerns for too many serotonergic medications.  In addition, continues PT with Bufford Lope, PT for vestibular rehab and balance training Continues to have severe nausea with this training.   His brief list of concerns: Shaking: seems to be worse to him lately - could be related to serotonergic medications. Has been working on reducing these for the past few weeks. Seems he is still taking amitriptyline . Will readjust meds Pulmonary referral - unfortunately unable to reach team. Requesting their information so he can contact them Dentistry referral: Dr. Monia Pouch unfortunately does not take his insurance - needs another Psychiatry referral: unable to schedule with Dr. Jannifer Franklin ENT: new provider - unfortunately didn't feel comfortable with Dr. Ezzard Standing, would like second opinion. Spots: on lips and glans of penis. Not painful. Small, white, and multiple. No drainage. Stable.   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   Vitamin D deficiency     Past Surgical History:  Procedure Laterality Date   AUDIOLOGICAL EVALUATION  02/28/2020       NO PAST SURGERIES      Family History   Family history unknown: Yes    Social History   Socioeconomic History   Marital status: Single    Spouse name: Not on file   Number of children: 0   Years of education: Not on file   Highest education level: Associate degree: academic program  Occupational History   Occupation: disabled  Tobacco Use   Smoking status: Never   Smokeless tobacco: Never  Vaping Use   Vaping Use: Never used  Substance and Sexual Activity   Alcohol use: Never   Drug use: Never   Sexual activity: Not on file  Other Topics Concern   Not on file  Social History Narrative   Lives alone   Right handed   Caffeine: sometimes soda   Social Determinants of Health   Financial Resource Strain: Not on file  Food Insecurity: Not on file  Transportation Needs: Not on file  Physical Activity: Not on file  Stress: Not on file  Social Connections: Not on file  Intimate Partner Violence: Not on file    Outpatient Medications Prior to Visit  Medication Sig Dispense Refill   albuterol (VENTOLIN HFA) 108 (90 Base) MCG/ACT inhaler Inhale 2 puffs into the lungs every 6 (six) hours as needed for wheezing or shortness of breath. 8 g 0   ALPRAZolam (XANAX) 0.5 MG tablet Take 1 tablet (0.5 mg total) by mouth at bedtime as needed for anxiety. 60 tablet 0   beclomethasone (QVAR REDIHALER) 80 MCG/ACT  inhaler Inhale 1 puff into the lungs 2 (two) times daily. 1 each 3   buPROPion (WELLBUTRIN XL) 300 MG 24 hr tablet Take 1 tablet (300 mg total) by mouth daily. 90 tablet 1   butalbital-acetaminophen-caffeine (FIORICET) 50-325-40 MG tablet Take 1 tablet by mouth every 6 (six) hours as needed for headache. 14 tablet 0   cetirizine (ZYRTEC) 10 MG tablet Take 1 tablet (10 mg total) by mouth daily. 30 tablet 11   fluticasone (FLONASE) 50 MCG/ACT nasal spray Place 2 sprays into both nostrils daily. 16 g 6   hydrOXYzine (ATARAX/VISTARIL) 10 MG tablet Take 1 tablet (10 mg total) by mouth 3 (three) times daily as needed. 30 tablet  0   ketoconazole (NIZORAL) 2 % shampoo APPLY TOPICALLY 2 TIMES A WEEK 120 mL 0   meclizine (ANTIVERT) 25 MG tablet Take 1 tablet (25 mg total) by mouth 3 (three) times daily as needed for dizziness or nausea. 90 tablet 3   nortriptyline (PAMELOR) 50 MG capsule Take 2 capsules (100 mg total) by mouth at bedtime. For headache 30 capsule 1   polyethylene glycol powder (GLYCOLAX/MIRALAX) 17 GM/SCOOP powder Take 17 g by mouth 2 (two) times daily as needed. 3350 g 1   scopolamine (TRANSDERM-SCOP, 1.5 MG,) 1 MG/3DAYS Place 1 patch (1.5 mg total) onto the skin every 3 (three) days. 10 patch 12   traZODone (DESYREL) 150 MG tablet TAKE 1 tablet (150mg ) by mouth before bed for sleep 90 tablet 1   Vitamin D, Ergocalciferol, (DRISDOL) 1.25 MG (50000 UNIT) CAPS capsule Take 1 capsule (50,000 Units total) by mouth every 7 (seven) days. 12 capsule 0   cyclobenzaprine (FLEXERIL) 5 MG tablet Take 2 tablets (10 mg total) by mouth 3 (three) times daily as needed for muscle spasms. 30 tablet 2   sertraline (ZOLOFT) 100 MG tablet Take 1 tablet (100 mg total) by mouth daily. 90 tablet 1   No facility-administered medications prior to visit.    Allergies  Allergen Reactions   Pork-Derived Products     ROS Review of Systems  Constitutional:  Positive for fatigue. Negative for activity change, appetite change, chills, diaphoresis, fever and unexpected weight change.  HENT: Negative.    Eyes: Negative.   Respiratory: Negative.    Cardiovascular: Negative.   Gastrointestinal: Negative.   Genitourinary: Negative.   Musculoskeletal:  Positive for arthralgias, back pain, myalgias, neck pain and neck stiffness. Negative for gait problem and joint swelling.  Skin: Negative.   Neurological:  Positive for dizziness and light-headedness. Negative for tremors, seizures, syncope, facial asymmetry, speech difficulty, weakness, numbness and headaches.  Psychiatric/Behavioral: Negative.    All other systems reviewed and are  negative.    Objective:    Physical Exam Constitutional:      General: He is not in acute distress.    Appearance: Normal appearance. He is normal weight. He is not ill-appearing, toxic-appearing or diaphoretic.  HENT:     Head:     Comments: Fordyce spots noted across lips Tongue showing evidence of trauma on lateral edges Cardiovascular:     Rate and Rhythm: Normal rate and regular rhythm.     Heart sounds: Normal heart sounds. No murmur heard.   No friction rub. No gallop.  Pulmonary:     Effort: Pulmonary effort is normal. No respiratory distress.     Breath sounds: Normal breath sounds. No stridor. No wheezing, rhonchi or rales.  Chest:     Chest wall: No tenderness.  Neurological:  General: No focal deficit present.     Mental Status: He is alert and oriented to person, place, and time. Mental status is at baseline.     Cranial Nerves: No cranial nerve deficit.     Sensory: No sensory deficit.     Motor: Weakness (stable) present.     Coordination: Coordination normal.     Gait: Gait normal.     Deep Tendon Reflexes: Reflexes normal.  Psychiatric:        Mood and Affect: Mood normal.        Behavior: Behavior normal.        Thought Content: Thought content normal.        Judgment: Judgment normal.    BP 121/86   Pulse 87   Temp 97.9 F (36.6 C) (Temporal)   Resp 18   Ht 5\' 8"  (1.727 m)   SpO2 98%   BMI 22.96 kg/m  Wt Readings from Last 3 Encounters:  05/16/20 151 lb (68.5 kg)  02/27/20 151 lb (68.5 kg)  02/16/20 151 lb (68.5 kg)     There are no preventive care reminders to display for this patient.  There are no preventive care reminders to display for this patient.  Lab Results  Component Value Date   TSH 1.30 02/14/2021   Lab Results  Component Value Date   WBC 5.0 02/14/2021   HGB 15.2 02/14/2021   HCT 44.5 02/14/2021   MCV 90.1 02/14/2021   PLT 162.0 02/14/2021   Lab Results  Component Value Date   NA 138 02/14/2021   K 3.4 (L)  02/14/2021   CO2 29 02/14/2021   GLUCOSE 83 02/14/2021   BUN 14 02/14/2021   CREATININE 0.69 02/14/2021   BILITOT 0.9 02/14/2021   ALKPHOS 44 02/14/2021   AST 15 02/14/2021   ALT 16 02/14/2021   PROT 7.2 02/14/2021   ALBUMIN 4.5 02/14/2021   CALCIUM 9.1 02/14/2021   ANIONGAP 11 02/05/2019   GFR 114.88 02/14/2021   Lab Results  Component Value Date   CHOL 203 (H) 02/14/2021   Lab Results  Component Value Date   HDL 62.80 02/14/2021   Lab Results  Component Value Date   LDLCALC 124 (H) 02/14/2021   Lab Results  Component Value Date   TRIG 81.0 02/14/2021   Lab Results  Component Value Date   CHOLHDL 3 02/14/2021   Lab Results  Component Value Date   HGBA1C 5.6 12/18/2020      Assessment & Plan:   Problem List Items Addressed This Visit   None Visit Diagnoses     Fordyce spots    -  Primary   Relevant Orders   Ambulatory referral to ENT   Bilateral hearing loss, unspecified hearing loss type       Relevant Orders   Ambulatory referral to ENT   Loss of smell       Relevant Orders   Ambulatory referral to ENT   Depression, unspecified depression type       Infected tooth       Cough           No orders of the defined types were placed in this encounter.   Follow-up: Return in about 3 months (around 06/14/2021) for med check.   PLAN Exam stable. No new neurological symptoms Reassured him that exam of mouth and lips reassuring. Likely grinding his teeth causing trauma to tongue - fordyce spots benign , no concerns.  Spent extensive time with him reviewing  medications and proper dosing. Agree with Dr Denyse Amass that he may have been overdoing serotonergic medications based on his answers today. While he and I communicate generally very well, unfortunately his usual [place] interpreter is on maternity leave at this time. He does note late in the visit that he wants a second opinion from ENT on his hearing loss.  He continues to search for community dentists  that accept his insurance. Patient encouraged to call clinic with any questions, comments, or concerns.  I spent 48 minutes with this patient performing the above noted services, coordinating follow up care, and discussing pathophysiology of disease process  Janeece Agee, NP

## 2021-03-14 NOTE — Patient Instructions (Addendum)
Shawn Edwards -  I have booked you an appointment with Dr. Thedore Mins on October 3 at 9:00am His office is located at [address], [address], [place] [address]   Call pulmonary at: [phone] [facility]  The spots on your lips are normal - they are called Fordyce spots - they are oil glands - they are very common.  Have sent a new ENT referral.   Please do not miss this appointment. It is very important to me that we have somebody available who can investigate the connection between your mental and physical health.   Thank you  Rich     If you have lab work done today you will be contacted with your lab results within the next 2 weeks.  If you have not heard from [place] then please contact us. The fastest way to get your results is to register for My Chart.   IF you received an x-ray today, you will receive an invoice from [facility]. Please contact [facility] at [phone] with questions or concerns regarding your invoice.   IF you received labwork today, you will receive an invoice from [facility]. Please contact [facility] at [phone] with questions or concerns regarding your invoice.   Our billing staff will not be able to assist you with questions regarding bills from these companies.  You will be contacted with the lab results as soon as they are available. The fastest way to get your results is to activate your My Chart account. Instructions are located on the last page of this paperwork. If you have not heard from [place] regarding the results in 2 weeks, please contact this office.

## 2021-03-17 ENCOUNTER — Other Ambulatory Visit: Payer: Self-pay

## 2021-03-17 ENCOUNTER — Ambulatory Visit: Payer: BC Managed Care – PPO | Admitting: Physical Therapy

## 2021-03-17 DIAGNOSIS — R2689 Other abnormalities of gait and mobility: Secondary | ICD-10-CM

## 2021-03-17 DIAGNOSIS — R42 Dizziness and giddiness: Secondary | ICD-10-CM

## 2021-03-17 DIAGNOSIS — M542 Cervicalgia: Secondary | ICD-10-CM

## 2021-03-17 DIAGNOSIS — R2681 Unsteadiness on feet: Secondary | ICD-10-CM | POA: Diagnosis not present

## 2021-03-17 DIAGNOSIS — R208 Other disturbances of skin sensation: Secondary | ICD-10-CM

## 2021-03-17 DIAGNOSIS — M6281 Muscle weakness (generalized): Secondary | ICD-10-CM

## 2021-03-17 DIAGNOSIS — R278 Other lack of coordination: Secondary | ICD-10-CM
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
## 2021-03-20 ENCOUNTER — Ambulatory Visit: Payer: BC Managed Care – PPO | Admitting: Physical Therapy

## 2021-03-20 ENCOUNTER — Other Ambulatory Visit: Payer: Self-pay

## 2021-03-20 DIAGNOSIS — R278 Other lack of coordination: Secondary | ICD-10-CM

## 2021-03-20 DIAGNOSIS — R42 Dizziness and giddiness: Secondary | ICD-10-CM

## 2021-03-20 DIAGNOSIS — R208 Other disturbances of skin sensation: Secondary | ICD-10-CM

## 2021-03-20 DIAGNOSIS — M542 Cervicalgia: Secondary | ICD-10-CM

## 2021-03-20 DIAGNOSIS — R2681 Unsteadiness on feet: Secondary | ICD-10-CM

## 2021-03-20 DIAGNOSIS — M6281 Muscle weakness (generalized): Secondary | ICD-10-CM

## 2021-03-20 DIAGNOSIS — R2689 Other abnormalities of gait and mobility: Secondary | ICD-10-CM
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
## 2021-03-21 ENCOUNTER — Other Ambulatory Visit: Payer: Self-pay

## 2021-03-21 ENCOUNTER — Telehealth: Payer: Self-pay

## 2021-03-21 DIAGNOSIS — M62838 Other muscle spasm: Secondary | ICD-10-CM

## 2021-03-21 MED ORDER — CYCLOBENZAPRINE HCL 5 MG PO TABS: 5.0000 mg | ORAL_TABLET | Freq: Three times a day (TID) | ORAL | 0 refills | Status: DC | PRN

## 2021-03-21 NOTE — Telephone Encounter (Signed)
Single refill of Flexeril given, with other medication concerns would asked that PCP make determination on refills.  Will forward to PCP

## 2021-03-21 NOTE — Telephone Encounter (Signed)
Pt needs refill on ALPRAZolam (XANAX) 0.5 MG tablet ,  buPROPion (WELLBUTRIN XL) 300 MG 24 hr tabl,  cetirizine (ZYRTEC) 10 MG tablet ,  cyclobenzaprine (FLEXERIL) 5 MG tablet ,  fluticasone (FLONASE) 50 MCG/ACT nasal spray ,  meclizine (ANTIVERT) 25 MG tablet .  nortriptyline (PAMELOR) 50 MG capsule ,  orndansetron 4 mg ,  scopolamine (TRANSDERM-SCOP, 1.5 MG,) 1 MG/3DAYS [[id number]] , sertraline (ZOLOFT) 100 MG tablet ,  traZODone (DESYREL) 150 MG tablet ,   [id number] - [place], [place] - [address] AT [facility] [address]   Follow up on referral on Ear and Nose   Pt call back [phone]

## 2021-03-21 NOTE — Telephone Encounter (Signed)
Pt needs refill on ALPRAZolam (XANAX) 0.5 MG tablet ,  buPROPion (WELLBUTRIN XL) 300 MG 24 hr tabl,  cetirizine (ZYRTEC) 10 MG tablet ,  cyclobenzaprine (FLEXERIL) 5 MG tablet ,  fluticasone (FLONASE) 50 MCG/ACT nasal spray ,  meclizine (ANTIVERT) 25 MG tablet .  nortriptyline (PAMELOR) 50 MG capsule ,  orndansetron 4 mg ,  scopolamine (TRANSDERM-SCOP, 1.5 MG,) 1 MG/3DAYS [[id number]] , sertraline (ZOLOFT) 100 MG tablet ,  traZODone (DESYREL) 150 MG tablet ,    [id number] - [place], [place] - [address] AT [facility] [address]   Patient called in for refill on all meds. Upon review of chart, patient only needs the cyclobenzaprine and ondansetron filled. All other meds are not due or have refills. Cyclobenzaprine LFD 08/30/20 #30 with 2 refills Ondansetron not listed on current list

## 2021-03-21 NOTE — Telephone Encounter (Signed)
Left a voice mail message informing patient he has refills on all meds except the cyclobenzaprine and ondansetron, which is not listed on his current med list. Informed patient to call his pharmacy to refill all other medications needed. Also informed that we were waiting on his notes to be finished up by the provider to send referral.

## 2021-03-23 ENCOUNTER — Other Ambulatory Visit: Payer: Self-pay | Admitting: Registered Nurse

## 2021-03-23 DIAGNOSIS — F32A Depression, unspecified: Secondary | ICD-10-CM

## 2021-03-23 DIAGNOSIS — R112 Nausea with vomiting, unspecified: Secondary | ICD-10-CM

## 2021-03-24 ENCOUNTER — Ambulatory Visit: Payer: BC Managed Care – PPO | Admitting: Physical Therapy

## 2021-03-24 ENCOUNTER — Other Ambulatory Visit: Payer: Self-pay

## 2021-03-24 DIAGNOSIS — M6281 Muscle weakness (generalized): Secondary | ICD-10-CM

## 2021-03-24 DIAGNOSIS — R42 Dizziness and giddiness: Secondary | ICD-10-CM

## 2021-03-24 DIAGNOSIS — R2681 Unsteadiness on feet: Secondary | ICD-10-CM

## 2021-03-24 DIAGNOSIS — M542 Cervicalgia: Secondary | ICD-10-CM

## 2021-03-24 DIAGNOSIS — R278 Other lack of coordination: Secondary | ICD-10-CM

## 2021-03-24 DIAGNOSIS — R2689 Other abnormalities of gait and mobility: Secondary | ICD-10-CM

## 2021-03-24 DIAGNOSIS — R208 Other disturbances of skin sensation: Secondary | ICD-10-CM
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
## 2021-03-27 ENCOUNTER — Ambulatory Visit: Payer: BC Managed Care – PPO | Admitting: Physical Therapy

## 2021-04-01 ENCOUNTER — Encounter: Payer: Self-pay | Admitting: Physical Therapy

## 2021-04-01 ENCOUNTER — Ambulatory Visit: Payer: BC Managed Care – PPO | Attending: Registered Nurse | Admitting: Physical Therapy

## 2021-04-01 ENCOUNTER — Other Ambulatory Visit: Payer: Self-pay

## 2021-04-01 VITALS — BP 122/83 | HR 80

## 2021-04-01 DIAGNOSIS — M6281 Muscle weakness (generalized): Secondary | ICD-10-CM | POA: Diagnosis present

## 2021-04-01 DIAGNOSIS — R208 Other disturbances of skin sensation: Secondary | ICD-10-CM | POA: Diagnosis present

## 2021-04-01 DIAGNOSIS — R2681 Unsteadiness on feet: Secondary | ICD-10-CM | POA: Diagnosis not present

## 2021-04-01 DIAGNOSIS — M542 Cervicalgia: Secondary | ICD-10-CM | POA: Diagnosis present

## 2021-04-01 DIAGNOSIS — R2689 Other abnormalities of gait and mobility: Secondary | ICD-10-CM | POA: Insufficient documentation

## 2021-04-01 DIAGNOSIS — R278 Other lack of coordination: Secondary | ICD-10-CM | POA: Insufficient documentation

## 2021-04-01 DIAGNOSIS — R42 Dizziness and giddiness: Secondary | ICD-10-CM | POA: Insufficient documentation
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
## 2021-04-02 ENCOUNTER — Ambulatory Visit: Payer: BC Managed Care – PPO | Admitting: Family Medicine

## 2021-04-03 ENCOUNTER — Ambulatory Visit: Payer: BC Managed Care – PPO | Admitting: Physical Therapy

## 2021-04-03 ENCOUNTER — Other Ambulatory Visit: Payer: Self-pay

## 2021-04-03 DIAGNOSIS — M6281 Muscle weakness (generalized): Secondary | ICD-10-CM

## 2021-04-03 DIAGNOSIS — R208 Other disturbances of skin sensation: Secondary | ICD-10-CM

## 2021-04-03 DIAGNOSIS — M542 Cervicalgia: Secondary | ICD-10-CM

## 2021-04-03 DIAGNOSIS — R2681 Unsteadiness on feet: Secondary | ICD-10-CM

## 2021-04-03 DIAGNOSIS — R42 Dizziness and giddiness: Secondary | ICD-10-CM

## 2021-04-03 DIAGNOSIS — R2689 Other abnormalities of gait and mobility: Secondary | ICD-10-CM

## 2021-04-03 DIAGNOSIS — R278 Other lack of coordination: Secondary | ICD-10-CM
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
## 2021-04-09 ENCOUNTER — Ambulatory Visit: Payer: BC Managed Care – PPO | Admitting: Family Medicine

## 2021-04-11 ENCOUNTER — Other Ambulatory Visit: Payer: Self-pay

## 2021-04-11 ENCOUNTER — Ambulatory Visit: Payer: BC Managed Care – PPO | Admitting: Physical Therapy

## 2021-04-11 DIAGNOSIS — R2681 Unsteadiness on feet: Secondary | ICD-10-CM

## 2021-04-11 DIAGNOSIS — R2689 Other abnormalities of gait and mobility: Secondary | ICD-10-CM

## 2021-04-11 DIAGNOSIS — R208 Other disturbances of skin sensation: Secondary | ICD-10-CM

## 2021-04-11 DIAGNOSIS — R278 Other lack of coordination: Secondary | ICD-10-CM

## 2021-04-11 DIAGNOSIS — M6281 Muscle weakness (generalized): Secondary | ICD-10-CM

## 2021-04-11 DIAGNOSIS — R42 Dizziness and giddiness: Secondary | ICD-10-CM

## 2021-04-11 DIAGNOSIS — M542 Cervicalgia: Secondary | ICD-10-CM

## 2021-04-11 NOTE — Patient Instructions (Signed)
Access Code: [email or web address] URL: [email or web address] Date: 04/11/2021 Prepared by: Bufford Lope  Exercises Seated Single Arm Shoulder Horizontal Abduction with Self-Anchored Resistance - 1 x daily - 7 x weekly - 2 sets - 5 reps Seated Shoulder Flexion with Self-Anchored Resistance - 1 x daily - 7 x weekly - 2 sets - 5 reps Seated Shoulder Extension with Self-Anchored Resistance - 1 x daily - 7 x weekly - 2 sets - 5 reps
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
## 2021-04-14 ENCOUNTER — Ambulatory Visit: Payer: BC Managed Care – PPO | Admitting: Registered Nurse

## 2021-04-15 ENCOUNTER — Ambulatory Visit: Payer: BC Managed Care – PPO | Admitting: Physical Therapy

## 2021-04-16 ENCOUNTER — Ambulatory Visit (INDEPENDENT_AMBULATORY_CARE_PROVIDER_SITE_OTHER): Payer: BC Managed Care – PPO | Admitting: Family Medicine

## 2021-04-16 ENCOUNTER — Ambulatory Visit (INDEPENDENT_AMBULATORY_CARE_PROVIDER_SITE_OTHER): Payer: BC Managed Care – PPO

## 2021-04-16 ENCOUNTER — Other Ambulatory Visit: Payer: Self-pay

## 2021-04-16 VITALS — BP 122/86 | HR 86

## 2021-04-16 DIAGNOSIS — M542 Cervicalgia: Secondary | ICD-10-CM

## 2021-04-16 DIAGNOSIS — F447 Conversion disorder with mixed symptom presentation: Secondary | ICD-10-CM

## 2021-04-16 DIAGNOSIS — F0781 Postconcussional syndrome: Secondary | ICD-10-CM
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
## 2021-04-16 NOTE — Patient Instructions (Signed)
Thank you for coming in today.   Return with me in 3 months.   Please get an Xray today before you leave.     Tinnitus Tinnitus refers to hearing a sound when there is no actual source for that sound. This is often described as ringing in the ears. However, people with this condition may hear a variety of noises, in one ear or in both ears. The sounds of tinnitus can be soft, loud, or somewhere in between. Tinnitus can last for a few seconds or can be constant for days. It may go away without treatment and come back at various times. When tinnitus is constant or happens often, it can lead to other problems, such as trouble sleeping and trouble concentrating. Almost everyone experiences tinnitus at some point. Tinnitus is not the same as hearing loss. Tinnitus that is long-lasting (chronic) or comes back often (recurs) may require medical attention. What are the causes? The cause of tinnitus is often not known. In some cases, it can result from: Exposure to loud noises from machinery, music, or other sources. An object (foreign body) stuck in the ear. Earwax buildup. Drinking alcohol or caffeine. Taking certain medicines. Age-related hearing loss. It may also be caused by medical conditions such as: Ear or sinus infections. Heart diseases or high blood pressure. Allergies. Mnire's disease. Thyroid problems. Tumors. A weak, bulging blood vessel (aneurysm) near the ear. What increases the risk? The following factors may make you more likely to develop this condition: Exposure to loud noises. Age. Tinnitus is more likely in older individuals. Using alcohol or tobacco. What are the signs or symptoms? The main symptom of tinnitus is hearing a sound when there is no source for that sound. It may sound like: Buzzing. Sizzling. Ringing. Blowing air. Hissing. Whistling. Other sounds may include: Roaring. Running water. A musical note. Tapping. Humming. Symptoms may affect only  one ear (unilateral) or both ears (bilateral). How is this diagnosed? Tinnitus is diagnosed based on your symptoms, your medical history, and a physical exam. Your health care provider may do a thorough hearing test (audiologic exam) if your tinnitus: Is unilateral. Causes hearing difficulties. Lasts 6 months or longer. You may work with a health care provider who specializes in hearing disorders (audiologist). You may be asked questions about your symptoms and how they affect your daily life. You may have other tests done, such as: CT scan. MRI. An imaging test of how blood flows through your blood vessels (angiogram). How is this treated? Treating an underlying medical condition can sometimes make tinnitus go away. If your tinnitus continues, other treatments may include: Therapy and counseling to help you manage the stress of living with tinnitus. Sound generators to mask the tinnitus. These include: Tabletop sound machines that play relaxing sounds to help you fall asleep. Wearable devices that fit in your ear and play sounds or music. Acoustic neural stimulation. This involves using headphones to listen to music that contains an auditory signal. Over time, listening to this signal may change some pathways in your brain and make you less sensitive to tinnitus. This treatment is used for very severe cases when no other treatment is working. Using hearing aids or cochlear implants if your tinnitus is related to hearing loss. Hearing aids are worn in the outer ear. Cochlear implants are surgically placed in the inner ear. Follow these instructions at home: Managing symptoms   When possible, avoid being in loud places and being exposed to loud sounds. Wear hearing protection,  such as earplugs, when you are exposed to loud noises. Use a white noise machine, a humidifier, or other devices to mask the sound of tinnitus. Practice techniques for reducing stress, such as meditation, yoga, or deep  breathing. Work with your health care provider if you need help with managing stress. Sleep with your head slightly raised. This may reduce the impact of tinnitus. General instructions Do not use stimulants, such as nicotine, alcohol, or caffeine. Talk with your health care provider about other stimulants to avoid. Stimulants are substances that can make you feel alert and attentive by increasing certain activities in the body (such as heart rate and blood pressure). These substances may make tinnitus worse. Take over-the-counter and prescription medicines only as told by your health care provider. Try to get plenty of sleep each night. Keep all follow-up visits. This is important. Contact a health care provider if: Your tinnitus continues for 3 weeks or longer without stopping. You develop sudden hearing loss. Your symptoms get worse or do not get better with home care. You feel you are not able to manage the stress of living with tinnitus. Get help right away if: You develop tinnitus after a head injury. You have tinnitus along with any of the following: Dizziness. Nausea and vomiting. Loss of balance. Sudden, severe headache. Vision changes. Facial weakness or weakness of arms or legs. These symptoms may represent a serious problem that is an emergency. Do not wait to see if the symptoms will go away. Get medical help right away. Call your local emergency services (911 in the [place] Do not drive yourself to the hospital. Summary Tinnitus refers to hearing a sound when there is no actual source for that sound. This is often described as ringing in the ears. Symptoms may affect only one ear (unilateral) or both ears (bilateral). Use a white noise machine, a humidifier, or other devices to mask the sound of tinnitus. Do not use stimulants, such as nicotine, alcohol, or caffeine. These substances may make tinnitus worse. This information is not intended to replace advice given to you by  your health care provider. Make sure you discuss any questions you have with your health care provider. Document Revised: 06/17/2020 Document Reviewed: 06/17/2020 Elsevier Patient Education  2022 [employer].

## 2021-04-17 ENCOUNTER — Ambulatory Visit: Payer: BC Managed Care – PPO | Admitting: Physical Therapy

## 2021-04-17 DIAGNOSIS — R208 Other disturbances of skin sensation: Secondary | ICD-10-CM

## 2021-04-17 DIAGNOSIS — R2681 Unsteadiness on feet: Secondary | ICD-10-CM

## 2021-04-17 DIAGNOSIS — R278 Other lack of coordination: Secondary | ICD-10-CM

## 2021-04-17 DIAGNOSIS — R42 Dizziness and giddiness: Secondary | ICD-10-CM

## 2021-04-17 DIAGNOSIS — R2689 Other abnormalities of gait and mobility: Secondary | ICD-10-CM

## 2021-04-17 DIAGNOSIS — M542 Cervicalgia: Secondary | ICD-10-CM

## 2021-04-17 DIAGNOSIS — M6281 Muscle weakness (generalized): Secondary | ICD-10-CM
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
## 2021-04-17 NOTE — Patient Instructions (Addendum)
Access Code: [email or web address] URL: [email or web address] Date: 04/17/2021 Prepared by: Bufford Lope  Exercises Seated Single Arm Shoulder Horizontal Abduction with Self-Anchored Resistance - 1 x daily - 7 x weekly - 2 sets - 5 reps Seated Shoulder Flexion with Self-Anchored Resistance - 1 x daily - 7 x weekly - 2 sets - 5 reps Seated Shoulder Extension with Self-Anchored Resistance - 1 x daily - 7 x weekly - 2 sets - 5 reps Seated Single Leg Hip Abduction with Resistance - 1 x daily - 7 x weekly - 1 sets - 10 reps Seated Knee Extension with Anchored Resistance - 1 x daily - 7 x weekly - 2 sets - 8 reps Seated Hamstring Curl with Anchored Resistance - 1 x daily - 7 x weekly - 2 sets - 8 reps Seated Hip Flexion - 1 x daily - 7 x weekly - 2 sets - 8 reps

## 2021-04-18 NOTE — Progress Notes (Signed)
Cervical spine x-ray looks normal.

## 2021-04-24 ENCOUNTER — Institutional Professional Consult (permissible substitution): Payer: BC Managed Care – PPO | Admitting: Pulmonary Disease

## 2021-04-25 ENCOUNTER — Ambulatory Visit: Payer: BC Managed Care – PPO | Admitting: Physical Therapy

## 2021-04-25 ENCOUNTER — Ambulatory Visit: Payer: BC Managed Care – PPO | Admitting: Registered Nurse

## 2021-04-28 ENCOUNTER — Ambulatory Visit: Payer: BC Managed Care – PPO | Admitting: Physical Therapy

## 2021-05-05 ENCOUNTER — Other Ambulatory Visit: Payer: Self-pay

## 2021-05-05 ENCOUNTER — Ambulatory Visit: Payer: BC Managed Care – PPO | Attending: Registered Nurse | Admitting: Physical Therapy

## 2021-05-05 ENCOUNTER — Encounter: Payer: Self-pay | Admitting: Physical Therapy

## 2021-05-05 VITALS — BP 110/80 | HR 81

## 2021-05-05 DIAGNOSIS — R278 Other lack of coordination: Secondary | ICD-10-CM | POA: Diagnosis present

## 2021-05-05 DIAGNOSIS — M542 Cervicalgia: Secondary | ICD-10-CM | POA: Insufficient documentation

## 2021-05-05 DIAGNOSIS — R42 Dizziness and giddiness: Secondary | ICD-10-CM | POA: Diagnosis present

## 2021-05-05 DIAGNOSIS — R2689 Other abnormalities of gait and mobility: Secondary | ICD-10-CM | POA: Insufficient documentation

## 2021-05-05 DIAGNOSIS — R2681 Unsteadiness on feet: Secondary | ICD-10-CM | POA: Insufficient documentation

## 2021-05-05 DIAGNOSIS — M6281 Muscle weakness (generalized): Secondary | ICD-10-CM | POA: Diagnosis present

## 2021-05-05 DIAGNOSIS — R208 Other disturbances of skin sensation: Secondary | ICD-10-CM | POA: Diagnosis present
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
## 2021-05-09 ENCOUNTER — Ambulatory Visit: Payer: BC Managed Care – PPO | Admitting: Physical Therapy

## 2021-05-10 NOTE — Progress Notes (Signed)
Established Patient Office Visit  Subjective:  Patient ID: Shawn Edwards, male    DOB: 17-Mar-1981  Age: 40 y.o. MRN: [id number]  CC:  Chief Complaint  Patient presents with   Follow-up    Patient states he is here to follow up for symptoms. Patient states he is still having the same symptoms. He states he has a great therapy doctor.    HPI Shawn Edwards presents for follow up   Still has ongoing post-concussive symptoms Doing PT with Shawn Edwards, going well despite no progress, he enjoys their sessions  Has been unable to make an appt with Shawn Edwards for neuropsych eval.  He has followed with Shawn Edwards in Sports Med as well as Shawn Edwards in neuro, neither of whom are able to determine an etiology for his symptoms.  Does note that he needs a number of medications refilled - he has a list. No Aes with meds. Subtherapeutic effect in regards to depression and anxiety.  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   Vitamin D deficiency     Past Surgical History:  Procedure Laterality Date   AUDIOLOGICAL EVALUATION  02/28/2020       NO PAST SURGERIES      Family History  Family history unknown: Yes    Social History   Socioeconomic History   Marital status: Single    Spouse name: Not on file   Number of children: 0   Years of education: Not on file   Highest education level: Associate degree: academic program  Occupational History   Occupation: disabled  Tobacco Use   Smoking status: Never   Smokeless tobacco: Never  Vaping Use   Vaping Use: Never used  Substance and Sexual Activity   Alcohol use: Never   Drug use: Never   Sexual activity: Not on file  Other Topics Concern   Not on file  Social History Narrative   Lives alone   Right handed   Caffeine: sometimes soda   Social  Determinants of Health   Financial Resource Strain: Not on file  Food Insecurity: Not on file  Transportation Needs: Not on file  Physical Activity: Not on file  Stress: Not on file  Social Connections: Not on file  Intimate Partner Violence: Not on file    Outpatient Medications Prior to Visit  Medication Sig Dispense Refill   albuterol (VENTOLIN HFA) 108 (90 Base) MCG/ACT inhaler Inhale 2 puffs into the lungs every 6 (six) hours as needed for wheezing or shortness of breath. 8 g 0   beclomethasone (QVAR REDIHALER) 80 MCG/ACT inhaler Inhale 1 puff into the lungs 2 (two) times daily. 1 each 3   buPROPion (WELLBUTRIN XL) 300 MG 24 hr tablet Take 1 tablet (300 mg total) by mouth daily. 90 tablet 1   butalbital-acetaminophen-caffeine (FIORICET) 50-325-40 MG tablet Take 1 tablet by mouth every 6 (six) hours as needed for headache. 14 tablet 0   cetirizine (ZYRTEC) 10 MG tablet Take 1 tablet (10 mg total) by mouth daily. 30 tablet 11   fluticasone (FLONASE) 50 MCG/ACT nasal spray Place 2 sprays into both nostrils daily. 16 g 6   hydrOXYzine (ATARAX/VISTARIL) 10 MG tablet Take 1 tablet (10 mg total) by mouth 3 (three) times daily as needed. 30 tablet 0   ketoconazole (NIZORAL)  2 % shampoo APPLY TOPICALLY 2 TIMES A WEEK 120 mL 0   polyethylene glycol powder (GLYCOLAX/MIRALAX) 17 GM/SCOOP powder Take 17 g by mouth 2 (two) times daily as needed. 3350 g 1   Vitamin D, Ergocalciferol, (DRISDOL) 1.25 MG (50000 UNIT) CAPS capsule Take 1 capsule (50,000 Units total) by mouth every 7 (seven) days. 12 capsule 0   ALPRAZolam (XANAX) 0.5 MG tablet Take 1 tablet (0.5 mg total) by mouth at bedtime as needed for anxiety. 60 tablet 0   amitriptyline (ELAVIL) 50 MG tablet Take 50 mg by mouth at bedtime.     amoxicillin-clavulanate (AUGMENTIN) 875-125 MG tablet Take 1 tablet by mouth 2 (two) times daily. 20 tablet 0   butalbital-acetaminophen-caffeine (FIORICET) 50-325-40 MG tablet TAKE 1 TABLET BY MOUTH EVERY 6  HOURS AS NEEDED FOR HEADACHE 14 tablet 5   cyclobenzaprine (FLEXERIL) 5 MG tablet Take 2 tablets (10 mg total) by mouth 3 (three) times daily as needed for muscle spasms. 30 tablet 2   doxycycline (VIBRA-TABS) 100 MG tablet Take 1 tablet (100 mg total) by mouth 2 (two) times daily. 14 tablet 0   Meclizine HCl 25 MG CHEW Chew 1 tablet by mouth 3 (three) times daily.     nortriptyline (PAMELOR) 50 MG capsule Take 2 capsules (100 mg total) by mouth at bedtime. For headache 30 capsule 1   ondansetron (ZOFRAN) 4 MG tablet TAKE 1 TABLET BY MOUTH EVERY 8 HOURS AS NEEDED FOR NAUSEA 40 tablet 0   predniSONE (STERAPRED UNI-PAK 21 TAB) 10 MG (21) TBPK tablet Take per package instructions. Do not skip doses. Finish entire supply. 1 each 0   scopolamine (TRANSDERM-SCOP, 1.5 MG,) 1 MG/3DAYS Place 1 patch (1.5 mg total) onto the skin every 3 (three) days. 10 patch 12   sertraline (ZOLOFT) 50 MG tablet Take 1 tablet (50 mg total) by mouth daily. 90 tablet 1   traZODone (DESYREL) 50 MG tablet TAKE 3 TABLETS(150 MG) BY MOUTH AT BEDTIME FOR SLEEP 90 tablet 0   No facility-administered medications prior to visit.    Allergies  Allergen Reactions   Pork-Derived Products     ROS Review of Systems  Constitutional: Negative.   HENT: Negative.    Eyes: Negative.   Respiratory: Negative.    Cardiovascular: Negative.   Gastrointestinal: Negative.   Genitourinary: Negative.   Musculoskeletal:  Positive for gait problem, myalgias, neck pain and neck stiffness.  Skin: Negative.   Psychiatric/Behavioral:  Positive for dysphoric mood and sleep disturbance. The patient is nervous/anxious.   All other systems reviewed and are negative.    Objective:    Physical Exam Constitutional:      General: He is not in acute distress.    Appearance: Normal appearance. He is normal weight. He is not ill-appearing, toxic-appearing or diaphoretic.  Cardiovascular:     Rate and Rhythm: Normal rate and regular rhythm.      Heart sounds: Normal heart sounds. No murmur heard.   No friction rub. No gallop.  Pulmonary:     Effort: Pulmonary effort is normal. No respiratory distress.     Breath sounds: Normal breath sounds. No stridor. No wheezing, rhonchi or rales.  Chest:     Chest wall: No tenderness.  Neurological:     General: No focal deficit present.     Mental Status: He is alert and oriented to person, place, and time. Mental status is at baseline.  Psychiatric:        Mood and Affect: Mood normal.  Behavior: Behavior normal.        Thought Content: Thought content normal.        Judgment: Judgment normal.    BP 113/79   Pulse 76   Temp 98.4 F (36.9 C) (Temporal)   Resp 18   Ht 5\' 7"  (1.702 m)   SpO2 96%   BMI 23.65 kg/m  Wt Readings from Last 3 Encounters:  05/16/20 151 lb (68.5 kg)  02/27/20 151 lb (68.5 kg)  02/16/20 151 lb (68.5 kg)     Health Maintenance Due  Topic Date Due   INFLUENZA VACCINE  Never done    There are no preventive care reminders to display for this patient.  Lab Results  Component Value Date   TSH 1.30 02/14/2021   Lab Results  Component Value Date   WBC 5.0 02/14/2021   HGB 15.2 02/14/2021   HCT 44.5 02/14/2021   MCV 90.1 02/14/2021   PLT 162.0 02/14/2021   Lab Results  Component Value Date   NA 138 02/14/2021   K 3.4 (L) 02/14/2021   CO2 29 02/14/2021   GLUCOSE 83 02/14/2021   BUN 14 02/14/2021   CREATININE 0.69 02/14/2021   BILITOT 0.9 02/14/2021   ALKPHOS 44 02/14/2021   AST 15 02/14/2021   ALT 16 02/14/2021   PROT 7.2 02/14/2021   ALBUMIN 4.5 02/14/2021   CALCIUM 9.1 02/14/2021   ANIONGAP 11 02/05/2019   GFR 114.88 02/14/2021   Lab Results  Component Value Date   CHOL 203 (H) 02/14/2021   Lab Results  Component Value Date   HDL 62.80 02/14/2021   Lab Results  Component Value Date   LDLCALC 124 (H) 02/14/2021   Lab Results  Component Value Date   TRIG 81.0 02/14/2021   Lab Results  Component Value Date    CHOLHDL 3 02/14/2021   Lab Results  Component Value Date   HGBA1C 5.6 12/18/2020      Assessment & Plan:   Problem List Items Addressed This Visit   None Visit Diagnoses     Bilious vomiting with nausea       Relevant Orders   Vitamin D (25 hydroxy) (Completed)   Vitamin B1 (Completed)   CK (Completed)   CBC with Differential/Platelet (Completed)   Comprehensive metabolic panel (Completed)   Lipid panel (Completed)   TSH (Completed)   Testosterone (Completed)   IgA (Completed)   Vitamin D deficiency       Relevant Orders   Vitamin D (25 hydroxy) (Completed)   Vitamin B1 (Completed)   CK (Completed)   CBC with Differential/Platelet (Completed)   Comprehensive metabolic panel (Completed)   Lipid panel (Completed)   TSH (Completed)   Testosterone (Completed)   IgA (Completed)   ANA w/Reflex   Large tongue       Relevant Orders   Vitamin D (25 hydroxy) (Completed)   Vitamin B1 (Completed)   CK (Completed)   CBC with Differential/Platelet (Completed)   Comprehensive metabolic panel (Completed)   Lipid panel (Completed)   TSH (Completed)   Testosterone (Completed)   Muscle weakness       Relevant Orders   Vitamin D (25 hydroxy) (Completed)   Vitamin B1 (Completed)   CK (Completed)   CBC with Differential/Platelet (Completed)   Comprehensive metabolic panel (Completed)   Lipid panel (Completed)   TSH (Completed)   Testosterone (Completed)   IgA (Completed)   ANA w/Reflex   Sleep disturbance       Relevant Medications  traZODone (DESYREL) 150 MG tablet   Other Relevant Orders   IgA (Completed)   ANA w/Reflex   Depression, unspecified depression type       Relevant Medications   traZODone (DESYREL) 150 MG tablet       Meds ordered this encounter  Medications   DISCONTD: sertraline (ZOLOFT) 100 MG tablet    Sig: Take 1 tablet (100 mg total) by mouth daily.    Dispense:  90 tablet    Refill:  1    Order Specific Question:   Supervising Provider     Answer:   Neva Seat, JEFFREY R [[id number]]   traZODone (DESYREL) 150 MG tablet    Sig: TAKE 1 tablet (150mg ) by mouth before bed for sleep    Dispense:  90 tablet    Refill:  1    Order Specific Question:   Supervising Provider    Answer:   , JEFFREY R [[id number]]    Follow-up: Return in about 3 months (around 05/17/2021) for check on chronic conditions.   PLAN Refill sertraline and trazodone as above. Reviewed risks of serotonergic medications with pt who indicates understanding. Labs collected. Will follow up with the patient as warranted. Will broaden scope to check for other factors that may be impacting patient's symptoms. Had an in depth discussion with patient about further addressing mental health and how poor mental health can heavily impact physical symptoms. I am not sure how much he is convinced of this, but we will continue to pursue neuropsych work up, ideally with [place], the 05/19/2021 interpreter. Patient encouraged to call clinic with any questions, comments, or concerns.  I spent 52 minutes with this patient including coordinating labs, refilling medications, and counseling on disease statea  [place], NP

## 2021-05-12 ENCOUNTER — Ambulatory Visit: Payer: BC Managed Care – PPO | Admitting: Physical Therapy

## 2021-05-12 ENCOUNTER — Other Ambulatory Visit: Payer: Self-pay

## 2021-05-12 DIAGNOSIS — R2681 Unsteadiness on feet: Secondary | ICD-10-CM

## 2021-05-12 DIAGNOSIS — M542 Cervicalgia: Secondary | ICD-10-CM

## 2021-05-12 DIAGNOSIS — R2689 Other abnormalities of gait and mobility: Secondary | ICD-10-CM

## 2021-05-12 DIAGNOSIS — R42 Dizziness and giddiness: Secondary | ICD-10-CM

## 2021-05-12 DIAGNOSIS — M6281 Muscle weakness (generalized): Secondary | ICD-10-CM

## 2021-05-12 DIAGNOSIS — R278 Other lack of coordination: Secondary | ICD-10-CM

## 2021-05-12 DIAGNOSIS — R208 Other disturbances of skin sensation: Secondary | ICD-10-CM

## 2021-05-12 NOTE — Patient Instructions (Signed)
Access Code: [email or web address] URL: [email or web address] Date: 05/12/2021 Prepared by: Bufford Lope  Exercises Seated Single Arm Shoulder Horizontal Abduction with Self-Anchored Resistance - 1 x daily - 7 x weekly - 2 sets - 5 reps Seated Shoulder Flexion with Self-Anchored Resistance - 1 x daily - 7 x weekly - 2 sets - 5 reps Seated Shoulder Extension with Self-Anchored Resistance - 1 x daily - 7 x weekly - 2 sets - 5 reps Seated Single Leg Hip Abduction with Resistance - 1 x daily - 7 x weekly - 1 sets - 10 reps Seated Knee Extension with Anchored Resistance - 1 x daily - 7 x weekly - 2 sets - 8 reps Seated Hamstring Curl with Anchored Resistance - 1 x daily - 7 x weekly - 2 sets - 8 reps Seated Hip Flexion - 1 x daily - 7 x weekly - 2 sets - 8 reps Seated Cervical Sidebending Stretch - 1 x daily - 7 x weekly - 2 sets - 10 second hold Seated Cervical Flexion Stretch with Finger Support Behind Neck - 1 x daily - 7 x weekly - 2 sets - 10 second hold Neck Rotations in Sitting - 1 x daily - 7 x weekly - 2 sets - 10 second hold Seated Isometric Cervical Flexion - 1 x daily - 7 x weekly - 2 sets - 5 second hold Seated Isometric Neck Rotation, alternate Left and Right - 1 x daily - 7 x weekly - 2 sets - 5 second hold Mini Squat with Counter Support - 1 x daily - 7 x weekly - 1 sets - 5 reps
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
## 2021-05-13 ENCOUNTER — Ambulatory Visit: Payer: BC Managed Care – PPO | Admitting: Registered Nurse

## 2021-05-13 ENCOUNTER — Other Ambulatory Visit: Payer: Self-pay | Admitting: Registered Nurse

## 2021-05-13 ENCOUNTER — Other Ambulatory Visit: Payer: Self-pay | Admitting: Family Medicine

## 2021-05-13 DIAGNOSIS — R42 Dizziness and giddiness: Secondary | ICD-10-CM

## 2021-05-13 DIAGNOSIS — R112 Nausea with vomiting, unspecified: Secondary | ICD-10-CM

## 2021-05-13 DIAGNOSIS — G44329 Chronic post-traumatic headache, not intractable: Secondary | ICD-10-CM

## 2021-05-13 DIAGNOSIS — M62838 Other muscle spasm: Secondary | ICD-10-CM

## 2021-05-13 DIAGNOSIS — G479 Sleep disorder, unspecified: Secondary | ICD-10-CM

## 2021-05-15 ENCOUNTER — Encounter: Payer: Self-pay | Admitting: Physical Therapy

## 2021-05-19 ENCOUNTER — Other Ambulatory Visit: Payer: Self-pay

## 2021-05-19 ENCOUNTER — Ambulatory Visit: Payer: BC Managed Care – PPO | Admitting: Physical Therapy

## 2021-05-19 DIAGNOSIS — R278 Other lack of coordination: Secondary | ICD-10-CM

## 2021-05-19 DIAGNOSIS — R208 Other disturbances of skin sensation: Secondary | ICD-10-CM

## 2021-05-19 DIAGNOSIS — M6281 Muscle weakness (generalized): Secondary | ICD-10-CM

## 2021-05-19 DIAGNOSIS — R2681 Unsteadiness on feet: Secondary | ICD-10-CM | POA: Diagnosis not present

## 2021-05-19 DIAGNOSIS — R2689 Other abnormalities of gait and mobility: Secondary | ICD-10-CM

## 2021-05-19 DIAGNOSIS — M542 Cervicalgia: Secondary | ICD-10-CM

## 2021-05-19 DIAGNOSIS — R42 Dizziness and giddiness: Secondary | ICD-10-CM

## 2021-05-19 NOTE — Patient Instructions (Signed)
Access Code: [email or web address] URL: [email or web address] Date: 05/19/2021 Prepared by: Bufford Lope  Exercises Seated Single Arm Shoulder Horizontal Abduction with Self-Anchored Resistance - 1 x daily - 7 x weekly - 2 sets - 5 reps Seated Shoulder Flexion with Self-Anchored Resistance - 1 x daily - 7 x weekly - 2 sets - 5 reps Seated Shoulder Extension with Self-Anchored Resistance - 1 x daily - 7 x weekly - 2 sets - 5 reps  Seated Single Leg Hip Abduction with Resistance - 1 x daily - 7 x weekly - 1 sets - 10 reps Seated Knee Extension with Anchored Resistance - 1 x daily - 7 x weekly - 2 sets - 8 reps Seated Hamstring Curl with Anchored Resistance - 1 x daily - 7 x weekly - 2 sets - 8 reps  Seated Hip Flexion - 1 x daily - 7 x weekly - 2 sets - 8 reps Seated Cervical Sidebending Stretch - 1 x daily - 7 x weekly - 2 sets - 10 second hold Seated Cervical Flexion Stretch with Finger Support Behind Neck - 1 x daily - 7 x weekly - 2 sets - 10 second hold Seated Shoulder Rolls - 1 x daily - 7 x weekly - 3 sets - 10 reps Neck Rotations in Sitting - 1 x daily - 7 x weekly - 2 sets - 10 second hold
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
## 2021-05-20 ENCOUNTER — Other Ambulatory Visit: Payer: Self-pay

## 2021-05-20 DIAGNOSIS — F32A Depression, unspecified: Secondary | ICD-10-CM

## 2021-05-20 DIAGNOSIS — R112 Nausea with vomiting, unspecified: Secondary | ICD-10-CM

## 2021-05-20 DIAGNOSIS — R42 Dizziness and giddiness: Secondary | ICD-10-CM

## 2021-05-20 DIAGNOSIS — G44329 Chronic post-traumatic headache, not intractable: Secondary | ICD-10-CM

## 2021-05-20 DIAGNOSIS — G479 Sleep disorder, unspecified: Secondary | ICD-10-CM

## 2021-05-21 ENCOUNTER — Other Ambulatory Visit: Payer: Self-pay | Admitting: Registered Nurse

## 2021-05-21 DIAGNOSIS — J40 Bronchitis, not specified as acute or chronic: Secondary | ICD-10-CM

## 2021-05-21 DIAGNOSIS — K5904 Chronic idiopathic constipation: Secondary | ICD-10-CM

## 2021-05-21 MED ORDER — ALBUTEROL SULFATE HFA 108 (90 BASE) MCG/ACT IN AERS
2.0000 | INHALATION_SPRAY | Freq: Four times a day (QID) | RESPIRATORY_TRACT | 0 refills | Status: AC | PRN
Start: 2021-05-21 — End: ?

## 2021-05-21 MED ORDER — POLYETHYLENE GLYCOL 3350 17 GM/SCOOP PO POWD: 17.0000 g | Freq: Two times a day (BID) | ORAL | 1 refills | Status: AC | PRN

## 2021-05-21 MED ORDER — QVAR REDIHALER 80 MCG/ACT IN AERB: 1.0000 | INHALATION_SPRAY | Freq: Two times a day (BID) | RESPIRATORY_TRACT | 3 refills | Status: AC

## 2021-05-21 NOTE — Telephone Encounter (Signed)
FYI - showing as all renewal requests canceled - saw this after you left yesterday. I did go through and refill the meds that it looks like he'd be out of.  Thanks,  Luan Pulling

## 2021-05-22 ENCOUNTER — Institutional Professional Consult (permissible substitution): Payer: BC Managed Care – PPO | Admitting: Pulmonary Disease

## 2021-05-29 ENCOUNTER — Encounter: Payer: Self-pay | Admitting: Physical Therapy

## 2021-05-29 ENCOUNTER — Ambulatory Visit: Payer: BC Managed Care – PPO | Attending: Registered Nurse | Admitting: Physical Therapy

## 2021-05-29 ENCOUNTER — Other Ambulatory Visit: Payer: Self-pay

## 2021-05-29 DIAGNOSIS — M6281 Muscle weakness (generalized): Secondary | ICD-10-CM | POA: Insufficient documentation

## 2021-05-29 DIAGNOSIS — R208 Other disturbances of skin sensation: Secondary | ICD-10-CM | POA: Insufficient documentation

## 2021-05-29 DIAGNOSIS — R2681 Unsteadiness on feet: Secondary | ICD-10-CM | POA: Insufficient documentation

## 2021-05-29 DIAGNOSIS — R42 Dizziness and giddiness: Secondary | ICD-10-CM | POA: Insufficient documentation

## 2021-05-29 DIAGNOSIS — M542 Cervicalgia: Secondary | ICD-10-CM | POA: Insufficient documentation

## 2021-05-29 DIAGNOSIS — R2689 Other abnormalities of gait and mobility: Secondary | ICD-10-CM | POA: Insufficient documentation
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
## 2021-05-30 ENCOUNTER — Ambulatory Visit (INDEPENDENT_AMBULATORY_CARE_PROVIDER_SITE_OTHER): Payer: BC Managed Care – PPO | Admitting: Registered Nurse

## 2021-05-30 ENCOUNTER — Encounter: Payer: Self-pay | Admitting: Registered Nurse

## 2021-05-30 ENCOUNTER — Other Ambulatory Visit: Payer: Self-pay

## 2021-05-30 VITALS — BP 112/80 | HR 85 | Temp 98.0°F | Resp 18 | Ht 68.0 in

## 2021-05-30 DIAGNOSIS — E559 Vitamin D deficiency, unspecified: Secondary | ICD-10-CM | POA: Diagnosis not present

## 2021-05-30 DIAGNOSIS — F32A Depression, unspecified: Secondary | ICD-10-CM | POA: Diagnosis not present

## 2021-05-30 DIAGNOSIS — E538 Deficiency of other specified B group vitamins: Secondary | ICD-10-CM | POA: Diagnosis not present

## 2021-05-30 DIAGNOSIS — R42 Dizziness and giddiness: Secondary | ICD-10-CM | POA: Diagnosis not present

## 2021-05-30 DIAGNOSIS — R0609 Other forms of dyspnea: Secondary | ICD-10-CM

## 2021-05-30 DIAGNOSIS — G44329 Chronic post-traumatic headache, not intractable: Secondary | ICD-10-CM

## 2021-05-30 DIAGNOSIS — G479 Sleep disorder, unspecified: Secondary | ICD-10-CM

## 2021-05-30 DIAGNOSIS — R0689 Other abnormalities of breathing: Secondary | ICD-10-CM

## 2021-05-30 NOTE — Progress Notes (Signed)
Established Patient Office Visit  Subjective:  Patient ID: Shawn Edwards, male    DOB: January 15, 1981  Age: 40 y.o. MRN: [id number]  CC:  Chief Complaint  Patient presents with   Follow-up    Patient states he is here for follow up. Patient states he is still having some of the same symptoms and pain.     HPI Shawn Edwards presents for follow up   5 main concerns today   TMJ  Ongoing, has started to reach out to dentists about dental health and TMJ He is continuing to work to find a Education officer, community who accepts his insurance We have advised him to call his insurance to ask if they are able to locate a dentist for him  PT / DOE Doing well, Bufford Lope, PT has continued to focus on balance and dizziness. He does note that he is getting out of breath very quickly and his breathing is short with minimal exercise. No chest pain, palpitations, or different headaches/visual changes.  Tinnitus Feels like it is more inside his head - but reduced. Hearing is improved. Had a visit with audiology - was noted that he had trouble with low tones. He disputes this, and he has another visit with audiology set.   Neck and shoulder pain Ongoing, midline neck pain posterior and triangle of the neck pain. L>R He has been working on this in PT He is doing more PT exercises at home.  Sleep disturbance Wakes frequently gasping for air Nonrestorative sleep Wakes with headaches Daytime somnolence Falls asleep easily but cannot stay asleep Snores, sensation of choking while he sleeps.    Overall he endorses that he is feeling better. He seems to be more focused on having an internal locus of control rather than helplessness.  He does note that he had moved his appt with Dr. Jannifer Franklin in neuropsych and would prefer to schedule with someone he can see outpatient through [facility].    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   Vitamin D deficiency     Past Surgical History:  Procedure Laterality Date   AUDIOLOGICAL EVALUATION  02/28/2020       NO PAST SURGERIES      Family History  Family history unknown: Yes    Social History   Socioeconomic History   Marital status: Single    Spouse name: Not on file   Number of children: 0   Years of education: Not on file   Highest education level: Associate degree: academic program  Occupational History   Occupation: disabled  Tobacco Use   Smoking status: Never   Smokeless tobacco: Never  Vaping Use   Vaping Use: Never used  Substance and Sexual Activity   Alcohol use: Never   Drug use: Never   Sexual activity: Not on file  Other Topics Concern   Not on file  Social History Narrative   Lives alone   Right handed   Caffeine: sometimes soda   Social Determinants of Health   Financial Resource Strain: Not on file  Food Insecurity: Not on file  Transportation Needs: Not on file  Physical Activity: Not on file  Stress: Not on file  Social Connections: Not on file  Intimate Partner Violence: Not on file    Outpatient Medications Prior to Visit  Medication  Sig Dispense Refill   albuterol (VENTOLIN HFA) 108 (90 Base) MCG/ACT inhaler Inhale 2 puffs into the lungs every 6 (six) hours as needed for wheezing or shortness of breath. 8 g 0   ALPRAZolam (XANAX) 0.5 MG tablet Take 1 tablet (0.5 mg total) by mouth at bedtime as needed for anxiety. 60 tablet 0   beclomethasone (QVAR REDIHALER) 80 MCG/ACT inhaler Inhale 1 puff into the lungs 2 (two) times daily. 1 each 3   buPROPion (WELLBUTRIN XL) 300 MG 24 hr tablet Take 1 tablet (300 mg total) by mouth daily. 90 tablet 1   butalbital-acetaminophen-caffeine (FIORICET) 50-325-40 MG tablet Take 1 tablet by mouth every 6 (six) hours as needed for headache. 14 tablet 0   cetirizine (ZYRTEC) 10 MG tablet Take 1 tablet  (10 mg total) by mouth daily. 30 tablet 11   cyclobenzaprine (FLEXERIL) 5 MG tablet TAKE 1 TO 2 TABLETS(5 TO 10 MG) BY MOUTH THREE TIMES DAILY AS NEEDED FOR MUSCLE SPASMS. USE LOWEST EFFECTIVE DOSE 30 tablet 0   fluticasone (FLONASE) 50 MCG/ACT nasal spray Place 2 sprays into both nostrils daily. 16 g 6   hydrOXYzine (ATARAX/VISTARIL) 10 MG tablet Take 1 tablet (10 mg total) by mouth 3 (three) times daily as needed. 30 tablet 0   ketoconazole (NIZORAL) 2 % shampoo APPLY TOPICALLY 2 TIMES A WEEK 120 mL 0   meclizine (ANTIVERT) 25 MG tablet Take 1 tablet (25 mg total) by mouth 3 (three) times daily as needed for dizziness or nausea. 90 tablet 3   nortriptyline (PAMELOR) 50 MG capsule TAKE 2 CAPSULES(100 MG) BY MOUTH AT BEDTIME FOR HEADACHE 30 capsule 1   ondansetron (ZOFRAN) 4 MG tablet TAKE 1 TABLET BY MOUTH EVERY 8 HOURS AS NEEDED FOR NAUSEA 40 tablet 0   polyethylene glycol powder (GLYCOLAX/MIRALAX) 17 GM/SCOOP powder Take 17 g by mouth 2 (two) times daily as needed. 3350 g 1   scopolamine (TRANSDERM-SCOP, 1.5 MG,) 1 MG/3DAYS Place 1 patch (1.5 mg total) onto the skin every 3 (three) days. 10 patch 12   sertraline (ZOLOFT) 100 MG tablet TAKE 1 TABLET(100 MG) BY MOUTH DAILY 90 tablet 1   traZODone (DESYREL) 150 MG tablet TAKE 1 tablet (150mg ) by mouth before bed for sleep 90 tablet 1   No facility-administered medications prior to visit.    Allergies  Allergen Reactions   Pork-Derived Products     ROS Review of Systems  Constitutional: Negative.   HENT: Negative.    Eyes: Negative.   Respiratory: Negative.    Cardiovascular: Negative.   Gastrointestinal: Negative.   Genitourinary: Negative.   Musculoskeletal: Negative.   Skin: Negative.   Neurological: Negative.   Psychiatric/Behavioral: Negative.    All other systems reviewed and are negative.    Objective:    Physical Exam Constitutional:      General: He is not in acute distress.    Appearance: Normal appearance. He is  normal weight. He is not ill-appearing, toxic-appearing or diaphoretic.  Cardiovascular:     Rate and Rhythm: Normal rate and regular rhythm.     Heart sounds: Normal heart sounds. No murmur heard.   No friction rub. No gallop.  Pulmonary:     Effort: Pulmonary effort is normal. No respiratory distress.     Breath sounds: Normal breath sounds. No stridor. No wheezing, rhonchi or rales.  Chest:     Chest wall: No tenderness.  Neurological:     General: No focal deficit present.     Mental Status:  He is alert and oriented to person, place, and time. Mental status is at baseline.  Psychiatric:        Mood and Affect: Mood normal.        Behavior: Behavior normal.        Thought Content: Thought content normal.        Judgment: Judgment normal.    BP 112/80   Pulse 85   Temp 98 F (36.7 C) (Temporal)   Resp 18   Ht 5\' 8"  (1.727 m)   SpO2 100%   BMI 22.96 kg/m  Wt Readings from Last 3 Encounters:  05/16/20 151 lb (68.5 kg)  02/27/20 151 lb (68.5 kg)  02/16/20 151 lb (68.5 kg)     Health Maintenance Due  Topic Date Due   INFLUENZA VACCINE  Never done    There are no preventive care reminders to display for this patient.  Lab Results  Component Value Date   TSH 1.30 02/14/2021   Lab Results  Component Value Date   WBC 5.0 02/14/2021   HGB 15.2 02/14/2021   HCT 44.5 02/14/2021   MCV 90.1 02/14/2021   PLT 162.0 02/14/2021   Lab Results  Component Value Date   NA 138 02/14/2021   K 3.4 (L) 02/14/2021   CO2 29 02/14/2021   GLUCOSE 83 02/14/2021   BUN 14 02/14/2021   CREATININE 0.69 02/14/2021   BILITOT 0.9 02/14/2021   ALKPHOS 44 02/14/2021   AST 15 02/14/2021   ALT 16 02/14/2021   PROT 7.2 02/14/2021   ALBUMIN 4.5 02/14/2021   CALCIUM 9.1 02/14/2021   ANIONGAP 11 02/05/2019   GFR 114.88 02/14/2021   Lab Results  Component Value Date   CHOL 203 (H) 02/14/2021   Lab Results  Component Value Date   HDL 62.80 02/14/2021   Lab Results  Component  Value Date   LDLCALC 124 (H) 02/14/2021   Lab Results  Component Value Date   TRIG 81.0 02/14/2021   Lab Results  Component Value Date   CHOLHDL 3 02/14/2021   Lab Results  Component Value Date   HGBA1C 5.6 12/18/2020      Assessment & Plan:   Problem List Items Addressed This Visit   None Visit Diagnoses     B12 deficiency    -  Primary   Relevant Orders   B12 and Folate Panel   TSH   Vitamin D deficiency       Relevant Orders   TSH   Vitamin D (25 hydroxy)   Dizziness       Relevant Orders   Basic Metabolic Panel (BMET)   TSH   Ambulatory referral to Neurology   Depression, unspecified depression type       Relevant Orders   Ambulatory referral to Neurology   Sleep disturbance       Relevant Orders   Ambulatory referral to Neurology   Chronic post-traumatic headache, not intractable       Relevant Orders   Ambulatory referral to Neurology   Sleep related choking sensation       Relevant Orders   Ambulatory referral to Neurology   Dyspnea on exertion       Relevant Orders   Ambulatory referral to Cardiology       No orders of the defined types were placed in this encounter.   Follow-up: Return in about 2 months (around 07/30/2021) for chronic conditions.   PLAN Refer to Dr. 09/27/2021 for neuropsych eval. Suggest he call  his insurance to check on dental coverage Labs collected. Will follow up with the patient as warranted. Given sleep disturbance and coughing/choking during sleep, refer to sleep study. Refer to cardiology for DOE. Favor deconditioning. Discussed with patient who would prefer cardiac workup. Patient encouraged to call clinic with any questions, comments, or concerns.   Janeece Agee, NP

## 2021-05-30 NOTE — Patient Instructions (Addendum)
Mr. Cabello -   Randie Heinz to see you  I will coordinate referrals  I will be in touch soon  See you in a couple months.   Thank you  Rich     If you have lab work done today you will be contacted with your lab results within the next 2 weeks.  If you have not heard from [place] then please contact us. The fastest way to get your results is to register for My Chart.   IF you received an x-ray today, you will receive an invoice from [facility]. Please contact [facility] at [phone] with questions or concerns regarding your invoice.   IF you received labwork today, you will receive an invoice from [facility]. Please contact [facility] at [phone] with questions or concerns regarding your invoice.   Our billing staff will not be able to assist you with questions regarding bills from these companies.  You will be contacted with the lab results as soon as they are available. The fastest way to get your results is to activate your My Chart account. Instructions are located on the last page of this paperwork. If you have not heard from [place] regarding the results in 2 weeks, please contact this office.

## 2021-05-31 LAB — BASIC METABOLIC PANEL
BUN: 10 mg/dL (ref 7–25)
CO2: 26 mmol/L (ref 20–32)
Calcium: 10 mg/dL (ref 8.6–10.3)
Chloride: 100 mmol/L (ref 98–110)
Creat: 0.88 mg/dL (ref 0.60–1.29)
Glucose, Bld: 90 mg/dL (ref 65–99)
Potassium: 3.8 mmol/L (ref 3.5–5.3)
Sodium: 136 mmol/L (ref 135–146)

## 2021-05-31 LAB — VITAMIN D 25 HYDROXY (VIT D DEFICIENCY, FRACTURES): Vit D, 25-Hydroxy: 36 ng/mL (ref 30–100)

## 2021-05-31 LAB — B12 AND FOLATE PANEL
Folate: 19.5 ng/mL
Vitamin B-12: 661 pg/mL (ref 200–1100)

## 2021-05-31 LAB — TSH: TSH: 1.58 mIU/L (ref 0.40–4.50)

## 2021-06-02 ENCOUNTER — Other Ambulatory Visit: Payer: Self-pay

## 2021-06-02 ENCOUNTER — Ambulatory Visit: Payer: BC Managed Care – PPO | Admitting: Physical Therapy

## 2021-06-02 DIAGNOSIS — R2689 Other abnormalities of gait and mobility: Secondary | ICD-10-CM

## 2021-06-02 DIAGNOSIS — R208 Other disturbances of skin sensation: Secondary | ICD-10-CM

## 2021-06-02 DIAGNOSIS — M6281 Muscle weakness (generalized): Secondary | ICD-10-CM | POA: Diagnosis present

## 2021-06-02 DIAGNOSIS — M542 Cervicalgia: Secondary | ICD-10-CM

## 2021-06-02 DIAGNOSIS — R2681 Unsteadiness on feet: Secondary | ICD-10-CM

## 2021-06-02 DIAGNOSIS — R42 Dizziness and giddiness: Secondary | ICD-10-CM
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
## 2021-06-05 ENCOUNTER — Ambulatory Visit: Payer: BC Managed Care – PPO | Admitting: Audiologist

## 2021-06-09 ENCOUNTER — Ambulatory Visit: Payer: BC Managed Care – PPO | Admitting: Physical Therapy

## 2021-06-11 ENCOUNTER — Institutional Professional Consult (permissible substitution): Payer: BC Managed Care – PPO | Admitting: Pulmonary Disease

## 2021-06-12 ENCOUNTER — Ambulatory Visit: Payer: BC Managed Care – PPO | Admitting: Audiologist

## 2021-06-17 ENCOUNTER — Ambulatory Visit: Payer: BC Managed Care – PPO | Admitting: Physical Therapy

## 2021-06-17 ENCOUNTER — Other Ambulatory Visit: Payer: Self-pay

## 2021-06-17 DIAGNOSIS — R2681 Unsteadiness on feet: Secondary | ICD-10-CM

## 2021-06-17 DIAGNOSIS — R2689 Other abnormalities of gait and mobility: Secondary | ICD-10-CM

## 2021-06-17 DIAGNOSIS — R208 Other disturbances of skin sensation: Secondary | ICD-10-CM

## 2021-06-17 DIAGNOSIS — M6281 Muscle weakness (generalized): Secondary | ICD-10-CM

## 2021-06-17 DIAGNOSIS — M542 Cervicalgia: Secondary | ICD-10-CM

## 2021-06-17 DIAGNOSIS — R42 Dizziness and giddiness: Secondary | ICD-10-CM
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
## 2021-06-23 ENCOUNTER — Telehealth: Payer: Self-pay | Admitting: Registered Nurse

## 2021-06-23 NOTE — Telephone Encounter (Signed)
Pt called in asking for a refills on  trazadone, sertraline, scopolamine, zofran, pamelor, alprazolam, bupropion, butalbital, cetirizine, cyclobenzaprine, flonase, and meclizine. Pt uses [facility] on [address]   Please advise

## 2021-06-24 ENCOUNTER — Other Ambulatory Visit: Payer: Self-pay | Admitting: Registered Nurse

## 2021-06-24 ENCOUNTER — Ambulatory Visit: Payer: BC Managed Care – PPO | Admitting: Physical Therapy

## 2021-06-24 DIAGNOSIS — M62838 Other muscle spasm: Secondary | ICD-10-CM

## 2021-06-24 DIAGNOSIS — R112 Nausea with vomiting, unspecified: Secondary | ICD-10-CM

## 2021-06-25 ENCOUNTER — Other Ambulatory Visit: Payer: Self-pay | Admitting: Registered Nurse

## 2021-06-25 DIAGNOSIS — G479 Sleep disorder, unspecified: Secondary | ICD-10-CM

## 2021-06-25 DIAGNOSIS — R059 Cough, unspecified: Secondary | ICD-10-CM

## 2021-06-25 DIAGNOSIS — G44329 Chronic post-traumatic headache, not intractable: Secondary | ICD-10-CM

## 2021-06-25 DIAGNOSIS — M62838 Other muscle spasm: Secondary | ICD-10-CM

## 2021-06-25 DIAGNOSIS — F32A Depression, unspecified: Secondary | ICD-10-CM

## 2021-06-25 DIAGNOSIS — H938X3 Other specified disorders of ear, bilateral: Secondary | ICD-10-CM

## 2021-06-25 DIAGNOSIS — R112 Nausea with vomiting, unspecified: Secondary | ICD-10-CM

## 2021-06-25 DIAGNOSIS — R42 Dizziness and giddiness: Secondary | ICD-10-CM

## 2021-06-25 MED ORDER — BUTALBITAL-APAP-CAFFEINE 50-325-40 MG PO TABS: 1.0000 | ORAL_TABLET | Freq: Four times a day (QID) | ORAL | 0 refills | Status: DC | PRN

## 2021-06-25 MED ORDER — ONDANSETRON HCL 4 MG PO TABS: 4.0000 mg | ORAL_TABLET | Freq: Three times a day (TID) | ORAL | 0 refills | Status: DC | PRN

## 2021-06-25 MED ORDER — HYDROXYZINE HCL 10 MG PO TABS: 10.0000 mg | ORAL_TABLET | Freq: Three times a day (TID) | ORAL | 0 refills | Status: DC | PRN

## 2021-06-25 MED ORDER — SCOPOLAMINE 1 MG/3DAYS TD PT72: 1.0000 | MEDICATED_PATCH | TRANSDERMAL | 12 refills | Status: DC

## 2021-06-25 MED ORDER — MECLIZINE HCL 25 MG PO TABS: 25.0000 mg | ORAL_TABLET | Freq: Three times a day (TID) | ORAL | 3 refills | Status: DC | PRN

## 2021-06-25 MED ORDER — SERTRALINE HCL 100 MG PO TABS: 100.0000 mg | ORAL_TABLET | Freq: Every day | ORAL | 1 refills | Status: DC

## 2021-06-25 MED ORDER — NORTRIPTYLINE HCL 50 MG PO CAPS: 50.0000 mg | ORAL_CAPSULE | Freq: Every day | ORAL | 1 refills | Status: DC

## 2021-06-25 MED ORDER — TRAZODONE HCL 150 MG PO TABS: ORAL_TABLET | ORAL | 1 refills | Status: DC

## 2021-06-25 MED ORDER — BUPROPION HCL ER (XL) 300 MG PO TB24: 300.0000 mg | ORAL_TABLET | Freq: Every day | ORAL | 1 refills | Status: DC

## 2021-06-25 MED ORDER — CYCLOBENZAPRINE HCL 5 MG PO TABS: 5.0000 mg | ORAL_TABLET | Freq: Three times a day (TID) | ORAL | 0 refills | Status: AC | PRN

## 2021-06-25 NOTE — Telephone Encounter (Signed)
Sent Thanks Rich

## 2021-06-25 NOTE — Telephone Encounter (Signed)
Patient needs a medication refills looks like on all of his medication.

## 2021-06-30 ENCOUNTER — Ambulatory Visit: Payer: Self-pay | Admitting: Physical Therapy

## 2021-07-02 ENCOUNTER — Ambulatory Visit: Payer: BC Managed Care – PPO | Admitting: Audiologist

## 2021-07-02 ENCOUNTER — Ambulatory Visit: Payer: BC Managed Care – PPO | Attending: Registered Nurse | Admitting: Audiologist

## 2021-07-02 ENCOUNTER — Other Ambulatory Visit: Payer: Self-pay

## 2021-07-02 ENCOUNTER — Telehealth: Payer: Self-pay | Admitting: Audiologist

## 2021-07-02 DIAGNOSIS — M6281 Muscle weakness (generalized): Secondary | ICD-10-CM | POA: Diagnosis present

## 2021-07-02 DIAGNOSIS — R2681 Unsteadiness on feet: Secondary | ICD-10-CM | POA: Insufficient documentation

## 2021-07-02 DIAGNOSIS — M542 Cervicalgia: Secondary | ICD-10-CM | POA: Diagnosis present

## 2021-07-02 DIAGNOSIS — R2689 Other abnormalities of gait and mobility: Secondary | ICD-10-CM | POA: Insufficient documentation

## 2021-07-02 DIAGNOSIS — R42 Dizziness and giddiness: Secondary | ICD-10-CM | POA: Insufficient documentation

## 2021-07-02 DIAGNOSIS — H9313 Tinnitus, bilateral: Secondary | ICD-10-CM | POA: Insufficient documentation

## 2021-07-02 DIAGNOSIS — R208 Other disturbances of skin sensation: Secondary | ICD-10-CM | POA: Insufficient documentation

## 2021-07-02 NOTE — Telephone Encounter (Signed)
Shawn Edwards had a recent hearing test with an audiologist at another facility. They determined he has mild hearing loss. There is no reason for him to come in for a repeat test with [place] a few weeks later. He should continue to follow up with the provider at [facility] if he wants to pursue hearing aids. Otherwise there is no reason for another audiologic evaluation this soon. Patient was called but did not answer. Appointment for today has been canceled. A voicemail was left using an interpretor.

## 2021-07-02 NOTE — Procedures (Signed)
Outpatient Audiology and [facility] [address] [place], [place]  [address] [phone]  AUDIOLOGICAL  EVALUATION  NAME: Shawn Edwards     DOB:   June 13, 1981      MRN: [id number]                                                                                     DATE: 07/02/2021     REFERENT: Janeece Agee, NP STATUS: Outpatient DIAGNOSIS: Normal Hearing, Tinnitus    History: Shawn Edwards was seen for an audiological evaluation. Shawn Edwards was informed that initially this appointment was canceled since he recently had a test with an audiologist at [facility]. He said he would like another hearing test check for additional progress in the recovery of his hearing. He also wants to follow up [facility] audiology since this is where he was seen previously. Megan agreed to testing and understands that it may not be covered.  Shawn Edwards reports that he hears much better. His english has greatly improved since his last visit with me. He states that he speaks five languages, and previously the dizziness, tinnitus, and memory loss made it difficult for him to recall words in English so he preferred interpretors. Today Shawn Edwards was seen without an interpretor. Shawn Edwards reports using gingko and other herbal supplements for the tinnitus. He is eating only organic foods and a mediterranean diet. The tinnitus is now in his head instead of his ear. He says it is still present but not as loud as before. Shawn Edwards was able to complete testing with the lights on. He was also able to walk short distances with a cane. He was able to complete speech testing in english. He is having sensitivity to sharp scraping sounds, like a metal fork on a plate but otherwise is tolerating sound better. Shawn Edwards is excited by his progress.  See previous audiology notes for Shawn Edwards's audiologic and medical history.    Evaluation:  Otoscopy showed a clear view of the tympanic membranes, bilaterally Tympanometry results were  consistent with normal middle ear pressure, bilaterally   Audiometric testing was completed using conventional audiometry with insert transducer. Speech Recognition Thresholds were consistent with pure tone averages. Word Recognition was excellent at conversation level using common english words from NU6 list at 50dB. Pure tone thresholds show normal hearing in both ears. Test results are consistent with normal hearing and good speech understanding.   Results:  The test results were reviewed with Shawn Edwards. He has normal hearing in both ears. His hearing has improve greatly since I first saw him. He was told not to use earplugs. He needs to focus on enriching is sound environment to help him with the tinnitus. This means he should play music in the house when home alone. Use background noise like a fountain when in silence and use white noise when sleeping. He needs to stop avoiding sound so he can start habituating back the noise of the world. By reducing the time he sits in silence he will reduce the time he aware of the tinnitus. Sleeping well, reducing his TMJ symptoms, and reducing over all stress will help as well. [name] reported understanding. He asked when he needs to  come back. His hearing is normal, there is no need for continued regular audiology follow up. If he is still concerned and is experiencing tinnitus, we can see him again in a year. He reported understanding.   Recommendations: 1.   No further audiologic testing is needed unless future hearing concerns arise.   Ammie Ferrier  Audiologist, Au.D., CCC-A 07/02/2021  2:18 PM  Cc: Janeece Agee, NP

## 2021-07-04 ENCOUNTER — Other Ambulatory Visit: Payer: Self-pay

## 2021-07-04 ENCOUNTER — Ambulatory Visit: Payer: BC Managed Care – PPO | Admitting: Physical Therapy

## 2021-07-04 DIAGNOSIS — R42 Dizziness and giddiness: Secondary | ICD-10-CM

## 2021-07-04 DIAGNOSIS — M6281 Muscle weakness (generalized): Secondary | ICD-10-CM

## 2021-07-04 DIAGNOSIS — H9313 Tinnitus, bilateral: Secondary | ICD-10-CM | POA: Diagnosis not present

## 2021-07-04 DIAGNOSIS — R2689 Other abnormalities of gait and mobility: Secondary | ICD-10-CM

## 2021-07-04 DIAGNOSIS — R208 Other disturbances of skin sensation: Secondary | ICD-10-CM

## 2021-07-04 DIAGNOSIS — R2681 Unsteadiness on feet: Secondary | ICD-10-CM
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
## 2021-07-07 ENCOUNTER — Other Ambulatory Visit: Payer: Self-pay

## 2021-07-07 ENCOUNTER — Ambulatory Visit: Payer: BC Managed Care – PPO | Admitting: Physical Therapy

## 2021-07-07 DIAGNOSIS — R42 Dizziness and giddiness: Secondary | ICD-10-CM

## 2021-07-07 DIAGNOSIS — R2689 Other abnormalities of gait and mobility: Secondary | ICD-10-CM

## 2021-07-07 DIAGNOSIS — R208 Other disturbances of skin sensation: Secondary | ICD-10-CM

## 2021-07-07 DIAGNOSIS — R2681 Unsteadiness on feet: Secondary | ICD-10-CM

## 2021-07-07 DIAGNOSIS — M542 Cervicalgia: Secondary | ICD-10-CM

## 2021-07-07 DIAGNOSIS — M6281 Muscle weakness (generalized): Secondary | ICD-10-CM

## 2021-07-07 DIAGNOSIS — H9313 Tinnitus, bilateral: Secondary | ICD-10-CM | POA: Diagnosis not present
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
## 2021-07-08 ENCOUNTER — Encounter: Payer: Self-pay | Admitting: Internal Medicine

## 2021-07-08 ENCOUNTER — Ambulatory Visit (INDEPENDENT_AMBULATORY_CARE_PROVIDER_SITE_OTHER): Payer: BC Managed Care – PPO | Admitting: Internal Medicine

## 2021-07-08 VITALS — BP 122/90 | HR 80 | Ht 67.72 in | Wt 158.4 lb

## 2021-07-08 DIAGNOSIS — J301 Allergic rhinitis due to pollen: Secondary | ICD-10-CM

## 2021-07-08 NOTE — Patient Instructions (Addendum)
Please schedule follow up scheduled with myself in 6 months.  If my schedule is not open yet, we will contact you with a reminder closer to that time. Please call [phone] if you haven't heard from [place] a month before.   Get blood work today. I will contact you with results on mychart.   Flonase - 1 spray on each side of your nose twice a day for first week, then 1 spray on each side.   Instructions for use: If you also use a saline nasal spray or rinse, use that first. Position the head with the chin slightly tucked. Use the right hand to spray into the left nostril and the right hand to spray into the left nostril.   Point the bottle away from the septum of your nose (cartilage that divides the two sides of your nose).  Hold the nostril closed on the opposite side from where you will spray Spray once and gently sniff to pull the medicine into the higher parts of your nose.  Don't sniff too hard as the medicine will drain down the back of your throat instead. Repeat with a second spray on the same side if prescribed. Repeat on the other side of your nose.  You can also take anti-histamine like zyrtec or benadryl over the counter for sinus congestion related to allergies as needed.
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
## 2021-07-09 LAB — RESPIRATORY ALLERGY PROFILE REGION II ~~LOC~~
Allergen, A. alternata, m6: <0.1 kU/L
Allergen, Cedar tree, t12: 0.13 kU/L — ABNORMAL HIGH
Allergen, Comm Silver Birch, t9: 0.13 kU/L — ABNORMAL HIGH
Allergen, Cottonwood, t14: 0.15 kU/L — ABNORMAL HIGH
Allergen, D pternoyssinus,d7: <0.1 kU/L
Allergen, Mouse Urine Protein, e78: <0.1 kU/L
Allergen, Mulberry, t76: <0.1 kU/L
Allergen, Oak,t7: 0.15 kU/L — ABNORMAL HIGH
Allergen, P. notatum, m1: <0.1 kU/L
Aspergillus fumigatus, m3: <0.1 kU/L
Bermuda Grass: 0.15 kU/L — ABNORMAL HIGH
Box Elder IgE: 0.14 kU/L — ABNORMAL HIGH
CLADOSPORIUM HERBARUM (M2) IGE: <0.1 kU/L
COMMON RAGWEED (SHORT) (W1) IGE: 0.18 kU/L — ABNORMAL HIGH
Cat Dander: <0.1 kU/L
Class: 0
Class: 0
Class: 0
Class: 0
Class: 0
Class: 0
Class: 0
Class: 0
Class: 0
Class: 0
Cockroach: 0.11 kU/L — ABNORMAL HIGH
D. farinae: <0.1 kU/L
Dog Dander: <0.1 kU/L
Elm IgE: 0.15 kU/L — ABNORMAL HIGH
IgE (Immunoglobulin E), Serum: 14 kU/L (ref <=114)
Johnson Grass: 0.15 kU/L — ABNORMAL HIGH
Pecan/Hickory Tree IgE: 0.13 kU/L — ABNORMAL HIGH
Rough Pigweed  IgE: 0.11 kU/L — ABNORMAL HIGH
Sheep Sorrel IgE: 0.13 kU/L — ABNORMAL HIGH
Timothy Grass: 0.13 kU/L — ABNORMAL HIGH

## 2021-07-09 LAB — INTERPRETATION:

## 2021-07-10 ENCOUNTER — Other Ambulatory Visit: Payer: Self-pay

## 2021-07-10 ENCOUNTER — Encounter: Payer: Self-pay | Admitting: Cardiology

## 2021-07-10 ENCOUNTER — Ambulatory Visit (INDEPENDENT_AMBULATORY_CARE_PROVIDER_SITE_OTHER): Payer: BC Managed Care – PPO | Admitting: Cardiology

## 2021-07-10 VITALS — BP 120/74 | HR 81 | Ht 67.72 in | Wt 160.0 lb

## 2021-07-10 DIAGNOSIS — R0609 Other forms of dyspnea: Secondary | ICD-10-CM | POA: Diagnosis not present

## 2021-07-10 DIAGNOSIS — R072 Precordial pain: Secondary | ICD-10-CM

## 2021-07-10 DIAGNOSIS — R079 Chest pain, unspecified: Secondary | ICD-10-CM

## 2021-07-10 MED ORDER — METOPROLOL TARTRATE 100 MG PO TABS: 100.0000 mg | ORAL_TABLET | Freq: Once | ORAL | 0 refills | Status: DC

## 2021-07-10 NOTE — Patient Instructions (Signed)
Medication Instructions:   Your physician recommends that you continue on your current medications as directed. Please refer to the Current Medication list given to you today.  *If you need a refill on your cardiac medications before your next appointment, please call your pharmacy*   Lab Work:  TODAY--BMET  If you have labs (blood work) drawn today and your tests are completely normal, you will receive your results only by: MyChart Message (if you have MyChart) OR A paper copy in the mail If you have any lab test that is abnormal or we need to change your treatment, we will call you to review the results.   Testing/Procedures:  Your physician has requested that you have an echocardiogram. Echocardiography is a painless test that uses sound waves to create images of your heart. It provides your doctor with information about the size and shape of your heart and how well your hearts chambers and valves are working. This procedure takes approximately one hour. There are no restrictions for this procedure.     Your cardiac CT will be scheduled at one of the below locations:   [facility] [address] [place], [place] [address] [phone]   If scheduled at [facility], please arrive at the [facility] main entrance (entrance A) of [facility] 30 minutes prior to test start time. You can use the FREE valet parking offered at the main entrance (encouraged to control the heart rate for the test) Proceed to the [facility] Radiology Department (first floor) to check-in and test prep.  Please follow these instructions carefully (unless otherwise directed):  Hold all erectile dysfunction medications at least 3 days (72 hrs) prior to test.  On the Night Before the Test: Be sure to Drink plenty of water. Do not consume any caffeinated/decaffeinated beverages or chocolate 12 hours prior to your test. Do not take any antihistamines 12 hours prior to your  test.  On the Day of the Test: Drink plenty of water until 1 hour prior to the test. Do not eat any food 4 hours prior to the test. You may take your regular medications prior to the test.  Take metoprolol 100 MG BY MOUTH (Lopressor) two hours prior to test.  After the Test: Drink plenty of water. After receiving IV contrast, you may experience a mild flushed feeling. This is normal. On occasion, you may experience a mild rash up to 24 hours after the test. This is not dangerous. If this occurs, you can take Benadryl 25 mg and increase your fluid intake. If you experience trouble breathing, this can be serious. If it is severe call 911 IMMEDIATELY. If it is mild, please call our office. If you take any of these medications: Glipizide/Metformin, Avandament, Glucavance, please do not take 48 hours after completing test unless otherwise instructed.  Please allow 2-4 weeks for scheduling of routine cardiac CTs. Some insurance companies require a pre-authorization which may delay scheduling of this test.   For non-scheduling related questions, please contact the cardiac imaging nurse navigator should you have any questions/concerns: Rockwell Alexandria, Cardiac Imaging Nurse Navigator Larey Brick, Cardiac Imaging Nurse Navigator  Heart and Vascular Services Direct Office Dial: [phone]   For scheduling needs, including cancellations and rescheduling, please call [place], [phone].   Follow-Up: At [facility], you and your health needs are our priority.  As part of our continuing mission to provide you with exceptional heart care, we have created designated Provider Care Teams.  These  Care Teams include your primary Cardiologist (physician) and Advanced Practice Providers (APPs -  Physician Assistants and Nurse Practitioners) who all work together to provide you with the care you need, when you need it.  We recommend signing up for the patient portal called "MyChart".  Sign  up information is provided on this After Visit Summary.  MyChart is used to connect with patients for Virtual Visits (Telemedicine).  Patients are able to view lab/test results, encounter notes, upcoming appointments, etc.  Non-urgent messages can be sent to your provider as well.   To learn more about what you can do with MyChart, go to [email or web address].    Your next appointment:   6 month(s)  The format for your next appointment:   In Person  Provider:   DR. Shari Prows

## 2021-07-11 LAB — BASIC METABOLIC PANEL
BUN/Creatinine Ratio: 15 (ref 9–20)
BUN: 12 mg/dL (ref 6–24)
CO2: 25 mmol/L (ref 20–29)
Calcium: 9.6 mg/dL (ref 8.7–10.2)
Chloride: 100 mmol/L (ref 96–106)
Creatinine, Ser: 0.8 mg/dL (ref 0.76–1.27)
Glucose: 95 mg/dL (ref 70–99)
Potassium: 4.1 mmol/L (ref 3.5–5.2)
Sodium: 140 mmol/L (ref 134–144)
eGFR: 113 mL/min/{1.73_m2} (ref >59)

## 2021-07-14 ENCOUNTER — Other Ambulatory Visit: Payer: Self-pay

## 2021-07-14 ENCOUNTER — Ambulatory Visit: Payer: BC Managed Care – PPO | Admitting: Physical Therapy

## 2021-07-14 DIAGNOSIS — R2681 Unsteadiness on feet: Secondary | ICD-10-CM

## 2021-07-14 DIAGNOSIS — H9313 Tinnitus, bilateral: Secondary | ICD-10-CM | POA: Diagnosis not present

## 2021-07-14 DIAGNOSIS — M6281 Muscle weakness (generalized): Secondary | ICD-10-CM

## 2021-07-14 DIAGNOSIS — R42 Dizziness and giddiness: Secondary | ICD-10-CM

## 2021-07-14 DIAGNOSIS — R208 Other disturbances of skin sensation: Secondary | ICD-10-CM

## 2021-07-14 DIAGNOSIS — R2689 Other abnormalities of gait and mobility: Secondary | ICD-10-CM

## 2021-07-14 DIAGNOSIS — M542 Cervicalgia: Secondary | ICD-10-CM
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
## 2021-07-16 ENCOUNTER — Ambulatory Visit: Payer: BC Managed Care – PPO | Admitting: Family Medicine

## 2021-07-29 ENCOUNTER — Telehealth: Payer: Self-pay | Admitting: *Deleted

## 2021-07-29 NOTE — Telephone Encounter (Signed)
-----   Message from [place] sent at 07/29/2021  8:40 AM EST ----- Regarding: RE: CARDIAC CT PER [place] I just tried to call again went to voicemail. A message was left. ----- Message ----- From: Loa Socks, LPN Sent: 01/27/1637   8:31 AM EST To: Lorrin Jackson, # Subject: FW: CARDIAC CT PER [place]                   Any word on when this could possibly be scheduled?  Thanks for all you do.  Emmakate Hypes   ----- Message ----- From: Loa Socks, LPN Sent: 45/36/4680   2:12 PM EST To: Florina Ou, Loa Socks, LPN, # Subject: CARDIAC CT PER [place]                       This pt needs to have a cardiac ct done for chest pain per Dr. Shari Prows. Order is in.  I am going to get his bmet today. Can you please arrange and shoot me the date?  Thanks ladies, Lajoyce Corners

## 2021-08-01 ENCOUNTER — Ambulatory Visit: Payer: BC Managed Care – PPO | Admitting: Registered Nurse

## 2021-08-05 ENCOUNTER — Other Ambulatory Visit: Payer: Self-pay | Admitting: Registered Nurse

## 2021-08-05 ENCOUNTER — Ambulatory Visit (HOSPITAL_COMMUNITY): Payer: BC Managed Care – PPO

## 2021-08-05 DIAGNOSIS — G479 Sleep disorder, unspecified: Secondary | ICD-10-CM

## 2021-08-08 ENCOUNTER — Ambulatory Visit: Payer: BC Managed Care – PPO | Admitting: Physical Therapy

## 2021-08-11 ENCOUNTER — Telehealth: Payer: Self-pay | Admitting: Cardiology

## 2021-08-11 NOTE — Telephone Encounter (Signed)
Called [facility] back to inquire more information about what service they are requesting from our office.  Per the rep Victorino Dike, she is unsure why Irving Shows reached out to our office about getting orders for a Cardiac CT from Dr. Shari Prows, for they do not even offer this benefit to their clients for a specialized test of this sort.  Per Victorino Dike Rep at [facility], she advised to disregard request Irving Shows called into our office, for a specialized CT as a Cardiac CT, has to be done by the Primary Ordering Provider to be done, which is Dr. Shari Prows.  Victorino Dike apologized on behalf of [facility] Shows calling about this, and no further action needed on this encounter at this time.

## 2021-08-11 NOTE — Telephone Encounter (Signed)
Shawn Edwards from [facility] called. The patient needs to get orders and ICD Codes for the CTA Dr. Shari Prows requested.   This is an advanced imaging benefit that the patient has through his insurance. When this service schedules the test it is covered 100% for the patient. Please fax the order to the company at  [phone]

## 2021-08-13 ENCOUNTER — Encounter (HOSPITAL_COMMUNITY): Payer: Self-pay | Admitting: Emergency Medicine

## 2021-08-15 ENCOUNTER — Ambulatory Visit: Payer: BC Managed Care – PPO | Attending: Registered Nurse | Admitting: Physical Therapy

## 2021-08-15 ENCOUNTER — Telehealth: Payer: Self-pay | Admitting: *Deleted

## 2021-08-15 NOTE — Telephone Encounter (Signed)
Note below referenced from appt notes from our Cardiac CT Scheduler:  14 DAY LETTER SENT - 08/14/21 PT HAS BEEN CALLED 3X labs 07/10/21

## 2021-08-15 NOTE — Telephone Encounter (Signed)
-----   Message from Brittany L Lynch sent at 07/29/2021  8:40 AM EST ----- °Regarding: RE: CARDIAC CT PER [place] °I just tried to call again went to voicemail. A message was left. °----- Message ----- °From: Audray Rumore M, LPN °Sent: 07/29/2021   8:31 AM EST °To: Brittany L Lynch, # °Subject: FW: CARDIAC CT PER [place]                  ° °Any word on when this could possibly be scheduled?  Thanks for all you do. ° °Analeia Ismael  ° °----- Message ----- °From: Naraya Stoneberg M, LPN °Sent: 07/10/2021   2:12 PM EST °To: Amanda Hepler, Arleny Kruger M Emmary Culbreath, LPN, # °Subject: CARDIAC CT PER [place]                      ° °This pt needs to have a cardiac ct done for chest pain per Dr. Pemberton. °Order is in.  I am going to get his bmet today. °Can you please arrange and shoot me the date? ° °Thanks ladies, °Vernesha Talbot  ° ° ° ° °

## 2021-08-21 ENCOUNTER — Ambulatory Visit: Payer: BC Managed Care – PPO | Admitting: Registered Nurse

## 2021-08-21 ENCOUNTER — Telehealth: Payer: Self-pay

## 2021-08-21 ENCOUNTER — Ambulatory Visit: Payer: BC Managed Care – PPO | Admitting: Family Medicine
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
## 2021-08-22 ENCOUNTER — Encounter: Payer: Self-pay | Admitting: Registered Nurse

## 2021-08-22 ENCOUNTER — Ambulatory Visit: Payer: BC Managed Care – PPO | Admitting: Family Medicine

## 2021-08-22 ENCOUNTER — Ambulatory Visit: Payer: BC Managed Care – PPO | Admitting: Physical Therapy
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
## 2021-08-25 ENCOUNTER — Ambulatory Visit: Payer: BC Managed Care – PPO | Admitting: Registered Nurse

## 2021-08-25 ENCOUNTER — Ambulatory Visit (HOSPITAL_COMMUNITY): Payer: BC Managed Care – PPO

## 2021-08-29 ENCOUNTER — Ambulatory Visit: Payer: BC Managed Care – PPO | Admitting: Physical Therapy

## 2021-08-29 ENCOUNTER — Telehealth: Payer: Self-pay | Admitting: Physical Therapy

## 2021-08-29 NOTE — Telephone Encounter (Signed)
Pt did not show for today's PT session.  This was patient's 3rd no-show for PT.  Each visit PT has attempted to reach pt by phone and has left 3 VM and 1 email, requesting a call back from pt to discuss current status and if pt would be able to continue with therapy at this time.  No return call or email back from patient.  Per clinic policy, after 3 no-shows, remaining visits have been cancelled.  Will not D/C patient at this time in case pt contacts clinic and is able to return to therapy.  Rico Junker, PT, DPT 08/29/21    2:14 PM

## 2021-09-01 ENCOUNTER — Telehealth: Payer: Self-pay | Admitting: Family Medicine

## 2021-09-01 NOTE — Telephone Encounter (Signed)
Patient called stating that he has been having a hard time with his vision. Due to that, he has had to change multiple appointments and had to have a friend use his MyChart to do so.  He has also missed appointments with his therapist so she was not able to reschedule him (see phone note from 02/03 by Bufford Lope).  Shawn Edwards expressed to me how much he appreciated [place] and Audra. He said that he was very thankful for the care he has been given and he apologies for the trouble of missed appointments and schedule changes.  He is currently scheduled for 09/18/2021 with Dr Denyse Amass and plans to attend that appointment at this time.  I called and spoke to [facility]. They said that due to his new vision issues, he would need to be seen by [place] and then new orders could be sent to them for him to continue rehab.   FYI\

## 2021-09-02 ENCOUNTER — Ambulatory Visit: Payer: BC Managed Care – PPO | Admitting: Registered Nurse

## 2021-09-02 ENCOUNTER — Ambulatory Visit: Payer: BC Managed Care – PPO | Admitting: Family Medicine

## 2021-09-05 ENCOUNTER — Ambulatory Visit: Payer: BC Managed Care – PPO | Admitting: Physical Therapy

## 2021-09-12 ENCOUNTER — Ambulatory Visit: Payer: BC Managed Care – PPO | Admitting: Physical Therapy

## 2021-09-17 ENCOUNTER — Ambulatory Visit: Payer: BC Managed Care – PPO | Admitting: Registered Nurse

## 2021-09-17 ENCOUNTER — Telehealth: Payer: Self-pay

## 2021-09-18 ENCOUNTER — Other Ambulatory Visit: Payer: Self-pay

## 2021-09-18 ENCOUNTER — Ambulatory Visit (INDEPENDENT_AMBULATORY_CARE_PROVIDER_SITE_OTHER): Payer: BC Managed Care – PPO

## 2021-09-18 ENCOUNTER — Ambulatory Visit (INDEPENDENT_AMBULATORY_CARE_PROVIDER_SITE_OTHER): Payer: BC Managed Care – PPO | Admitting: Family Medicine

## 2021-09-18 VITALS — BP 120/90 | HR 80 | Ht 67.0 in

## 2021-09-18 DIAGNOSIS — M25512 Pain in left shoulder: Secondary | ICD-10-CM

## 2021-09-18 DIAGNOSIS — G479 Sleep disorder, unspecified: Secondary | ICD-10-CM | POA: Diagnosis not present

## 2021-09-18 DIAGNOSIS — F0781 Postconcussional syndrome: Secondary | ICD-10-CM | POA: Diagnosis not present

## 2021-09-18 DIAGNOSIS — F447 Conversion disorder with mixed symptom presentation: Secondary | ICD-10-CM | POA: Diagnosis not present

## 2021-09-18 DIAGNOSIS — G8929 Other chronic pain: Secondary | ICD-10-CM | POA: Diagnosis not present

## 2021-09-18 MED ORDER — QUETIAPINE FUMARATE 25 MG PO TABS: 25.0000 mg | ORAL_TABLET | Freq: Every day | ORAL | 1 refills | Status: AC

## 2021-09-18 NOTE — Patient Instructions (Addendum)
Thank you for coming in today.   Please get an Xray today before you leave   I've sent the prescription to your pharmacy.  You should hear from MRI scheduling within 1 week. If you do not hear please let me know.    Recheck back after MRI
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
## 2021-09-19 ENCOUNTER — Ambulatory Visit: Payer: BC Managed Care – PPO | Admitting: Physical Therapy

## 2021-09-22 ENCOUNTER — Ambulatory Visit (HOSPITAL_COMMUNITY): Payer: BC Managed Care – PPO | Attending: Cardiology

## 2021-09-22 ENCOUNTER — Other Ambulatory Visit: Payer: Self-pay

## 2021-09-22 DIAGNOSIS — R0609 Other forms of dyspnea: Secondary | ICD-10-CM | POA: Diagnosis not present

## 2021-09-22 LAB — ECHOCARDIOGRAM COMPLETE
Area-P 1/2: 3.78 cm2
S' Lateral: 2.6 cm

## 2021-09-22 NOTE — Progress Notes (Signed)
Left shoulder x-ray looks normal to radiology.  MRI should be helpful.

## 2021-09-26 ENCOUNTER — Encounter: Payer: Self-pay | Admitting: Registered Nurse

## 2021-09-26 ENCOUNTER — Telehealth: Payer: Self-pay

## 2021-09-26 ENCOUNTER — Ambulatory Visit: Payer: BC Managed Care – PPO | Admitting: Physical Therapy

## 2021-09-26 ENCOUNTER — Other Ambulatory Visit: Payer: Self-pay

## 2021-09-26 ENCOUNTER — Telehealth: Payer: Self-pay | Admitting: Family Medicine

## 2021-09-26 ENCOUNTER — Ambulatory Visit (INDEPENDENT_AMBULATORY_CARE_PROVIDER_SITE_OTHER): Payer: BC Managed Care – PPO | Admitting: Registered Nurse

## 2021-09-26 VITALS — BP 134/84 | HR 84 | Temp 98.0°F | Resp 18 | Ht 67.0 in | Wt 166.6 lb

## 2021-09-26 DIAGNOSIS — E559 Vitamin D deficiency, unspecified: Secondary | ICD-10-CM

## 2021-09-26 DIAGNOSIS — F0781 Postconcussional syndrome: Secondary | ICD-10-CM

## 2021-09-26 DIAGNOSIS — H8190 Unspecified disorder of vestibular function, unspecified ear: Secondary | ICD-10-CM

## 2021-09-26 DIAGNOSIS — H539 Unspecified visual disturbance: Secondary | ICD-10-CM

## 2021-09-26 DIAGNOSIS — G479 Sleep disorder, unspecified: Secondary | ICD-10-CM | POA: Diagnosis not present

## 2021-09-26 DIAGNOSIS — F447 Conversion disorder with mixed symptom presentation: Secondary | ICD-10-CM

## 2021-09-26 DIAGNOSIS — E538 Deficiency of other specified B group vitamins: Secondary | ICD-10-CM

## 2021-09-26 DIAGNOSIS — R42 Dizziness and giddiness: Secondary | ICD-10-CM

## 2021-09-26 DIAGNOSIS — F32A Depression, unspecified: Secondary | ICD-10-CM
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
## 2021-09-26 NOTE — Telephone Encounter (Signed)
-----   Message from Dierdre Highman, PT sent at 09/22/2021  5:20 PM EST ----- ?Regarding: RE: more visitis? ?That's great to hear that he is doing better!  Our last few sessions were very limited because of his shoulder pain. ? ?Yes, I would like to continue working with him and continue the incremental progress; that would be great if you could enter a new PT eval and treat order.  I will have the front office work on getting him scheduled. ? ?I had mentioned to him that there is a neuro-optometrist in [place]: Idamae Lusher, OD at [employer], that has been working with a lot of our concussion, TBI and stroke patients that I think would be able to do a more thorough assessment on his vision and give [place] very specific things to work on.  He has been seen at [facility] but I think their assessment was limited because at the time Reuel wasn't able to keep his eyes open and was having more difficulty with hearing/processing.  My experience with [facility] is they focus more on patients requiring surgery and less on optometry and visual rehab.  But yes, he would need a referral for that as well and then I can work with Dr. Glennis Brink on getting him scheduled. ? ?Thank you for your continued assistance and guidance with [place], ?Bufford Lope   ?----- Message ----- ?From: Rodolph Bong, MD ?Sent: 09/19/2021   6:13 AM EST ?To: Dierdre Highman, PT ?Subject: more visitis?                                 ? ?I saw Genevieve  today for the first time in a while. ? ?He no showed me a few times too.  I think he is having difficulty with transportation. However I was very happy to see that he looked better today than I have ever seen him.  ?His speech was more fluid and articulate, he was able to remember details, and he had less tremor. ? ?I think your work with him has been life-changing. ? ?He still has a long way to go.  Would you like me to refer him back to you? ?I think slow incremental progress is probably the way to go with him. ? ?He  did mention that he thinks he might be waiting for a neurologist or neuro-ophthalmologist referral to [facility] to call him back.  I do not see any referral in epic.  Are you aware of anything like this? ? ?Clayburn Pert ? ? ?

## 2021-09-26 NOTE — Telephone Encounter (Signed)
Done  Thanks  [name]

## 2021-09-26 NOTE — Telephone Encounter (Signed)
Pt wanted to make sure Richard send referrals through on [facility] and referral to have his eyes exam ?

## 2021-09-26 NOTE — Telephone Encounter (Signed)
Error

## 2021-09-26 NOTE — Telephone Encounter (Signed)
FYI

## 2021-09-26 NOTE — Telephone Encounter (Signed)
New PT ordered and new neuro-ophthalmology ordered ?

## 2021-09-26 NOTE — Patient Instructions (Addendum)
Shawn Edwards -  ? ?Great to see you. ? ?I will refer you to Dr. Christia Reading for an ENT opinion. His office will call you ? ?I prefer the seroquel to the trazodone. I would recommend that you take seroquel every night instead of the trazodone. ? ?I will send an order for an MRI of the neck.  ? ?I will send a referral for home health physical therapy. They will call you to set this up.  ? ?Let's meet again in 3 months. ? ?Thank you, ? ?[name]  ? ? ? ? ?????? ????? - ? ????? ?? ????. ? ??????? ??? ????? ????? ???? ?????? ??? ??? ?? ????? ?????? ????????. ????? ??? ???? ?? ? ???? ???? ?????????? ??? ??????????. ???? ??? ???? ?????????? ?? ???? ????? ?? ??????????. ? ?????? ????? ?????? ????? ??????? ?????????? ??????. ? ?????? ????? ?????? ??????? ????? ????????. ??? ?????? ?? ?????? ???. ? ??????? ??? ???? ??? 3 ????. ? ?????? ??? ? ???? ? ? ?If you have lab work done today you will be contacted with your lab results within the next 2 weeks.  If you have not heard from [place] then please contact us. The fastest way to get your results is to register for My Chart. ? ? ?IF you received an x-ray today, you will receive an invoice from [facility]. Please contact [facility] at [phone] with questions or concerns regarding your invoice.  ? ?IF you received labwork today, you will receive an invoice from [facility]. Please contact [facility] at [phone] with questions or concerns regarding your invoice.  ? ?Our billing staff will not be able to assist you with questions regarding bills from these companies. ? ?You will be contacted with the lab results as soon as they are available. The fastest way to get your results is to activate your My Chart account. Instructions are located on the last page of this paperwork. If you have not heard from [place] regarding the results in 2 weeks, please contact this office. ?  ? ? ?

## 2021-09-29 ENCOUNTER — Telehealth: Payer: Self-pay | Admitting: *Deleted

## 2021-09-29 NOTE — Telephone Encounter (Signed)
-----   Message from Lorrin Jackson sent at 09/29/2021  5:19 PM EST ----- ?Regarding: ct heart ?Scheduled 10/24/21 at 4:30 ? ?

## 2021-10-01 LAB — COMPREHENSIVE METABOLIC PANEL
AG Ratio: 1.6 (calc) (ref 1.0–2.5)
ALT: 19 U/L (ref 9–46)
AST: 16 U/L (ref 10–40)
Albumin: 4.4 g/dL (ref 3.6–5.1)
Alkaline phosphatase (APISO): 49 U/L (ref 36–130)
BUN: 15 mg/dL (ref 7–25)
CO2: 27 mmol/L (ref 20–32)
Calcium: 9.7 mg/dL (ref 8.6–10.3)
Chloride: 103 mmol/L (ref 98–110)
Creat: 0.82 mg/dL (ref 0.60–1.29)
Globulin: 2.8 g/dL (calc) (ref 1.9–3.7)
Glucose, Bld: 99 mg/dL (ref 65–99)
Potassium: 3.8 mmol/L (ref 3.5–5.3)
Sodium: 139 mmol/L (ref 135–146)
Total Bilirubin: 0.4 mg/dL (ref 0.2–1.2)
Total Protein: 7.2 g/dL (ref 6.1–8.1)

## 2021-10-01 LAB — LIPID PANEL
Cholesterol: 184 mg/dL (ref <200)
HDL: 64 mg/dL (ref >=40)
LDL Cholesterol (Calc): 101 mg/dL (calc) — ABNORMAL HIGH
Non-HDL Cholesterol (Calc): 120 mg/dL (calc) (ref <130)
Total CHOL/HDL Ratio: 2.9 (calc) (ref <5.0)
Triglycerides: 91 mg/dL (ref <150)

## 2021-10-01 LAB — IRON,TIBC AND FERRITIN PANEL
%SAT: 25 % (calc) (ref 20–48)
Ferritin: 27 ng/mL — ABNORMAL LOW (ref 38–380)
Iron: 78 ug/dL (ref 50–180)
TIBC: 316 mcg/dL (calc) (ref 250–425)

## 2021-10-01 LAB — VITAMIN D 25 HYDROXY (VIT D DEFICIENCY, FRACTURES): Vit D, 25-Hydroxy: 24 ng/mL — ABNORMAL LOW (ref 30–100)

## 2021-10-01 LAB — B12 AND FOLATE PANEL
Folate: 12.8 ng/mL
Vitamin B-12: 423 pg/mL (ref 200–1100)

## 2021-10-01 LAB — EXTRA SPECIMEN

## 2021-10-01 LAB — ZINC

## 2021-10-01 LAB — HEMOGLOBIN A1C
Hgb A1c MFr Bld: 5.4 % of total Hgb (ref <5.7)
Mean Plasma Glucose: 108 mg/dL
eAG (mmol/L): 6 mmol/L

## 2021-10-01 LAB — TESTOSTERONE: Testosterone: 480 ng/dL (ref 250–827)

## 2021-10-03 ENCOUNTER — Other Ambulatory Visit: Payer: Self-pay

## 2021-10-03 DIAGNOSIS — G479 Sleep disorder, unspecified: Secondary | ICD-10-CM

## 2021-10-03 DIAGNOSIS — G44329 Chronic post-traumatic headache, not intractable: Secondary | ICD-10-CM

## 2021-10-03 DIAGNOSIS — H938X3 Other specified disorders of ear, bilateral: Secondary | ICD-10-CM

## 2021-10-03 DIAGNOSIS — F32A Depression, unspecified: Secondary | ICD-10-CM

## 2021-10-03 DIAGNOSIS — R059 Cough, unspecified: Secondary | ICD-10-CM

## 2021-10-03 DIAGNOSIS — R112 Nausea with vomiting, unspecified: Secondary | ICD-10-CM

## 2021-10-03 DIAGNOSIS — R42 Dizziness and giddiness: Secondary | ICD-10-CM

## 2021-10-03 MED ORDER — BUPROPION HCL ER (XL) 300 MG PO TB24: 300.0000 mg | ORAL_TABLET | Freq: Every day | ORAL | 1 refills | Status: DC

## 2021-10-03 MED ORDER — HYDROXYZINE HCL 10 MG PO TABS: 10.0000 mg | ORAL_TABLET | Freq: Three times a day (TID) | ORAL | 0 refills | Status: AC | PRN

## 2021-10-03 MED ORDER — SERTRALINE HCL 100 MG PO TABS: 100.0000 mg | ORAL_TABLET | Freq: Every day | ORAL | 1 refills | Status: DC

## 2021-10-03 MED ORDER — MECLIZINE HCL 25 MG PO TABS: 25.0000 mg | ORAL_TABLET | Freq: Three times a day (TID) | ORAL | 3 refills | Status: DC | PRN

## 2021-10-03 MED ORDER — ALPRAZOLAM 0.5 MG PO TABS: 0.5000 mg | ORAL_TABLET | Freq: Every evening | ORAL | 0 refills | Status: DC | PRN

## 2021-10-03 MED ORDER — ONDANSETRON HCL 4 MG PO TABS: 4.0000 mg | ORAL_TABLET | Freq: Three times a day (TID) | ORAL | 0 refills | Status: DC | PRN

## 2021-10-03 MED ORDER — NORTRIPTYLINE HCL 50 MG PO CAPS: 50.0000 mg | ORAL_CAPSULE | Freq: Every day | ORAL | 1 refills | Status: DC

## 2021-10-03 NOTE — Telephone Encounter (Signed)
Patient is requesting a refill of the following medications: ?Requested Prescriptions  ? ?Pending Prescriptions Disp Refills  ? ALPRAZolam (XANAX) 0.5 MG tablet 60 tablet 0  ?  Sig: Take 1 tablet (0.5 mg total) by mouth at bedtime as needed for anxiety.  ? buPROPion (WELLBUTRIN XL) 300 MG 24 hr tablet 90 tablet 1  ?  Sig: Take 1 tablet (300 mg total) by mouth daily.  ? sertraline (ZOLOFT) 100 MG tablet 90 tablet 1  ?  Sig: Take 1 tablet (100 mg total) by mouth daily.  ? ondansetron (ZOFRAN) 4 MG tablet 40 tablet 0  ?  Sig: Take 1 tablet (4 mg total) by mouth every 8 (eight) hours as needed. for nausea  ? nortriptyline (PAMELOR) 50 MG capsule 30 capsule 1  ?  Sig: Take 1 capsule (50 mg total) by mouth at bedtime.  ? meclizine (ANTIVERT) 25 MG tablet 90 tablet 3  ?  Sig: Take 1 tablet (25 mg total) by mouth 3 (three) times daily as needed for dizziness or nausea.  ? hydrOXYzine (ATARAX) 10 MG tablet 30 tablet 0  ?  Sig: Take 1 tablet (10 mg total) by mouth 3 (three) times daily as needed.  ? ? ?Date of patient request: 10/03/21 ?Last office visit: 09/26/21 ?Date of last refill: 06/25/21, 02/19/21,  ?Last refill amount: 90, 60, 30 ? ? ?

## 2021-10-03 NOTE — Telephone Encounter (Signed)
Encourage patient to contact the pharmacy for refills or they can request refills through [facility] ? ?(Please schedule appointment if patient has not been seen in over a year) ? ? ? ?WHAT PHARMACY WOULD THEY LIKE THIS SENT [facility] [phone] - [place], [place] - [address] AT [facility] [address]  ? ?MEDICATION NAME & DOSE:ALPRAZolam (XANAX) 0.5 MG tablet , buPROPion (WELLBUTRIN XL) 300 MG 24 hr tablet , cetirizine (ZYRTEC) 10 MG tablet , hydrOXYzine (ATARAX) 10 MG tablet , meclizine (ANTIVERT) 25 MG tablet , nortriptyline (PAMELOR) 50 MG capsule , ondansetron (ZOFRAN) 4 MG tablet , sertraline (ZOLOFT) 100 MG tablet  ? ?NOTES/COMMENTS FROM PATIENT: ? ? ? ? ? ?Front office please notify patient: ?It takes 48-72 hours to process rx refill requests ?Ask patient to call pharmacy to ensure rx is ready before heading there.  ? ?

## 2021-10-10 ENCOUNTER — Ambulatory Visit
Admission: RE | Admit: 2021-10-10 | Discharge: 2021-10-10 | Disposition: A | Discharge status: Home / Self Care | Payer: BC Managed Care – PPO | Source: Ambulatory Visit | Attending: Family Medicine | Admitting: Family Medicine

## 2021-10-10 ENCOUNTER — Other Ambulatory Visit: Payer: Self-pay

## 2021-10-10 DIAGNOSIS — G8929 Other chronic pain: Secondary | ICD-10-CM

## 2021-10-14 NOTE — Progress Notes (Signed)
Shoulder MRI shows a partial rotator cuff tear and some arthritis at the small joint at the top of the shoulder.  Recommend return to clinic in the near future to talk about the results in full detail with an interpreter.

## 2021-10-22 ENCOUNTER — Encounter (HOSPITAL_COMMUNITY): Payer: Self-pay

## 2021-10-22 NOTE — Telephone Encounter (Signed)
Error

## 2021-10-24 ENCOUNTER — Ambulatory Visit (HOSPITAL_COMMUNITY): Admission: RE | Admit: 2021-10-24 | Payer: BC Managed Care – PPO | Source: Ambulatory Visit

## 2021-11-06 ENCOUNTER — Other Ambulatory Visit: Payer: Self-pay | Admitting: Family Medicine

## 2021-11-06 DIAGNOSIS — G479 Sleep disorder, unspecified: Secondary | ICD-10-CM

## 2021-11-07 ENCOUNTER — Ambulatory Visit: Payer: BC Managed Care – PPO

## 2021-11-17 ENCOUNTER — Other Ambulatory Visit: Payer: Self-pay | Admitting: Registered Nurse

## 2021-11-17 DIAGNOSIS — J302 Other seasonal allergic rhinitis: Secondary | ICD-10-CM

## 2021-11-17 DIAGNOSIS — G44329 Chronic post-traumatic headache, not intractable: Secondary | ICD-10-CM

## 2021-11-17 DIAGNOSIS — R112 Nausea with vomiting, unspecified: Secondary | ICD-10-CM

## 2021-11-26 ENCOUNTER — Telehealth: Payer: Self-pay | Admitting: *Deleted

## 2021-11-26 ENCOUNTER — Ambulatory Visit: Payer: BC Managed Care – PPO

## 2021-11-26 NOTE — Telephone Encounter (Signed)
-----   Message from Lorrin Jackson sent at 11/26/2021  1:15 PM EDT ----- ?Regarding: ct heart ?He called and cancel ct scan due to health issues. He was called to r/s no answer a message was left . ? ?

## 2021-11-26 NOTE — Telephone Encounter (Signed)
Will forward to Dr. Shari Prows as an Lorain Childes. ?

## 2021-11-28 ENCOUNTER — Ambulatory Visit (HOSPITAL_COMMUNITY): Payer: BC Managed Care – PPO

## 2021-12-09 ENCOUNTER — Encounter: Payer: Self-pay | Admitting: Cardiology

## 2021-12-26 ENCOUNTER — Ambulatory Visit: Payer: BC Managed Care – PPO

## 2022-01-02 ENCOUNTER — Ambulatory Visit (HOSPITAL_COMMUNITY): Payer: BC Managed Care – PPO

## 2022-01-02 ENCOUNTER — Other Ambulatory Visit (HOSPITAL_COMMUNITY): Payer: BC Managed Care – PPO

## 2022-01-06 ENCOUNTER — Ambulatory Visit: Payer: BC Managed Care – PPO | Admitting: Cardiology

## 2022-01-09 ENCOUNTER — Ambulatory Visit: Payer: BC Managed Care – PPO

## 2022-01-28 ENCOUNTER — Other Ambulatory Visit: Payer: Self-pay | Admitting: Family Medicine

## 2022-01-28 ENCOUNTER — Other Ambulatory Visit: Payer: Self-pay

## 2022-01-28 ENCOUNTER — Other Ambulatory Visit: Payer: Self-pay | Admitting: Registered Nurse

## 2022-01-28 DIAGNOSIS — R42 Dizziness and giddiness: Secondary | ICD-10-CM

## 2022-01-28 DIAGNOSIS — R0609 Other forms of dyspnea: Secondary | ICD-10-CM

## 2022-01-28 DIAGNOSIS — J302 Other seasonal allergic rhinitis: Secondary | ICD-10-CM

## 2022-01-28 DIAGNOSIS — F32A Depression, unspecified: Secondary | ICD-10-CM

## 2022-01-28 DIAGNOSIS — G44329 Chronic post-traumatic headache, not intractable: Secondary | ICD-10-CM

## 2022-01-28 DIAGNOSIS — G479 Sleep disorder, unspecified: Secondary | ICD-10-CM

## 2022-01-28 DIAGNOSIS — R112 Nausea with vomiting, unspecified: Secondary | ICD-10-CM

## 2022-01-28 DIAGNOSIS — R079 Chest pain, unspecified: Secondary | ICD-10-CM

## 2022-01-28 NOTE — Telephone Encounter (Signed)
Patient is requesting a refill of the following medications: Requested Prescriptions   Pending Prescriptions Disp Refills   ALPRAZolam (XANAX) 0.5 MG tablet [Pharmacy Med Name: ALPRAZOLAM 0.5MG  TABLETS] 60 tablet     Sig: TAKE 1 TABLET(0.5 MG) BY MOUTH AT BEDTIME AS NEEDED FOR ANXIETY   butalbital-acetaminophen-caffeine (FIORICET) 50-325-40 MG tablet [Pharmacy Med Name: BUT/ACETAMINOPHEN/CAFF 50-325-40 TB] 14 tablet     Sig: TAKE 1 TABLET BY MOUTH EVERY 6 HOURS AS NEEDED FOR HEADACHE   Meclizine HCl 25 MG CHEW [Pharmacy Med Name: MECLIZINE 25MG  (O-T-C) CHEW TABLETS] 90 tablet 3    Sig: CHEW 1 TABLET BY MOUTH EVERY MORNING, EVERY NOON, AND EVERY NIGHT AT BEDTIME   ondansetron (ZOFRAN) 4 MG tablet [Pharmacy Med Name: ONDANSETRON 4MG  TABLETS] 40 tablet 0    Sig: TAKE 1 TABLET(4 MG) BY MOUTH EVERY 8 HOURS AS NEEDED FOR NAUSEA    Date of patient request: 01/28/22 Last office visit: 09/26/21 Date of last refill: 10/03/21 Last refill amount: 90, 40

## 2022-01-29 MED ORDER — METOPROLOL TARTRATE 100 MG PO TABS: 100.0000 mg | ORAL_TABLET | Freq: Once | ORAL | 0 refills | Status: AC

## 2022-01-29 MED ORDER — SCOPOLAMINE 1 MG/3DAYS TD PT72: 1.0000 | MEDICATED_PATCH | TRANSDERMAL | 12 refills | Status: AC

## 2022-01-29 MED ORDER — FLUTICASONE PROPIONATE 50 MCG/ACT NA SUSP: 2.0000 | Freq: Every day | NASAL | 6 refills | Status: AC

## 2022-01-29 MED ORDER — MECLIZINE HCL 25 MG PO TABS: 25.0000 mg | ORAL_TABLET | Freq: Three times a day (TID) | ORAL | 3 refills | Status: AC | PRN

## 2022-01-29 MED ORDER — ONDANSETRON HCL 4 MG PO TABS: 4.0000 mg | ORAL_TABLET | Freq: Two times a day (BID) | ORAL | 0 refills | Status: DC | PRN

## 2022-01-29 MED ORDER — TRAZODONE HCL 50 MG PO TABS
25.0000 mg | ORAL_TABLET | Freq: Every evening | ORAL | 0 refills | Status: DC | PRN
Start: 2022-01-29 — End: 2022-03-11

## 2022-01-29 MED ORDER — ALPRAZOLAM 0.5 MG PO TABS: 0.5000 mg | ORAL_TABLET | Freq: Every evening | ORAL | 0 refills | Status: AC | PRN

## 2022-01-29 MED ORDER — CETIRIZINE HCL 10 MG PO TABS: ORAL_TABLET | ORAL | 11 refills | Status: AC

## 2022-01-29 MED ORDER — NORTRIPTYLINE HCL 50 MG PO CAPS: 50.0000 mg | ORAL_CAPSULE | Freq: Every day | ORAL | 1 refills | Status: DC

## 2022-01-29 MED ORDER — SERTRALINE HCL 100 MG PO TABS: 100.0000 mg | ORAL_TABLET | Freq: Every day | ORAL | 1 refills | Status: DC

## 2022-01-29 NOTE — Telephone Encounter (Signed)
Noted  

## 2022-01-29 NOTE — Telephone Encounter (Signed)
PDMP consulted. Seems to be spacing out alprazolam use as intended.  Last fill on 12/01/21 for #30 for daily prn use.  Last visit on 09/26/21, has not yet scheduled follow up  Jari Sportsman, NP

## 2022-02-11 ENCOUNTER — Telehealth (HOSPITAL_COMMUNITY): Payer: Self-pay | Admitting: Emergency Medicine

## 2022-02-11 NOTE — Telephone Encounter (Signed)
Attempted to call patient regarding upcoming cardiac CT appointment. °Left message on voicemail with name and callback number °Minal Stuller RN Navigator Cardiac Imaging °[facility] °[phone] Office °[phone] Cell ° °

## 2022-02-12 ENCOUNTER — Encounter: Payer: Self-pay | Admitting: *Deleted

## 2022-02-13 ENCOUNTER — Ambulatory Visit (HOSPITAL_COMMUNITY): Payer: BC Managed Care – PPO
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
## 2022-02-18 ENCOUNTER — Ambulatory Visit: Payer: BC Managed Care – PPO | Admitting: Cardiology

## 2022-03-09 ENCOUNTER — Ambulatory Visit: Payer: BC Managed Care – PPO | Admitting: Cardiology

## 2022-03-10 ENCOUNTER — Other Ambulatory Visit: Payer: Self-pay | Admitting: Family Medicine

## 2022-03-10 DIAGNOSIS — G479 Sleep disorder, unspecified: Secondary | ICD-10-CM

## 2022-03-11 ENCOUNTER — Other Ambulatory Visit: Payer: Self-pay

## 2022-03-11 DIAGNOSIS — G479 Sleep disorder, unspecified: Secondary | ICD-10-CM

## 2022-03-11 DIAGNOSIS — G44329 Chronic post-traumatic headache, not intractable: Secondary | ICD-10-CM

## 2022-03-11 DIAGNOSIS — R42 Dizziness and giddiness: Secondary | ICD-10-CM

## 2022-03-11 MED ORDER — NORTRIPTYLINE HCL 50 MG PO CAPS: 50.0000 mg | ORAL_CAPSULE | Freq: Every day | ORAL | 1 refills | Status: DC

## 2022-03-17 ENCOUNTER — Encounter (HOSPITAL_COMMUNITY): Payer: Self-pay

## 2022-03-17 ENCOUNTER — Emergency Department (HOSPITAL_COMMUNITY)
Admission: EM | Admit: 2022-03-17 | Discharge: 2022-03-17 | Disposition: A | Discharge status: Home / Self Care | Payer: BC Managed Care – PPO | Attending: Emergency Medicine | Admitting: Emergency Medicine

## 2022-03-17 ENCOUNTER — Other Ambulatory Visit: Payer: Self-pay

## 2022-03-17 DIAGNOSIS — L237 Allergic contact dermatitis due to plants, except food: Secondary | ICD-10-CM | POA: Diagnosis present

## 2022-03-17 LAB — CBC WITH DIFFERENTIAL/PLATELET
Abs Immature Granulocytes: 0.08 10*3/uL — ABNORMAL HIGH (ref 0.00–0.07)
Basophils Absolute: 0 10*3/uL (ref 0.0–0.1)
Basophils Relative: 0 %
Eosinophils Absolute: 0 10*3/uL (ref 0.0–0.5)
Eosinophils Relative: 0 %
HCT: 43.9 % (ref 39.0–52.0)
Hemoglobin: 15.1 g/dL (ref 13.0–17.0)
Immature Granulocytes: 1 %
Lymphocytes Relative: 11 %
Lymphs Abs: 1.4 10*3/uL (ref 0.7–4.0)
MCH: 31.3 pg (ref 26.0–34.0)
MCHC: 34.4 g/dL (ref 30.0–36.0)
MCV: 91.1 fL (ref 80.0–100.0)
Monocytes Absolute: 0.6 10*3/uL (ref 0.1–1.0)
Monocytes Relative: 5 %
Neutro Abs: 10.7 10*3/uL — ABNORMAL HIGH (ref 1.7–7.7)
Neutrophils Relative %: 83 %
Platelets: 263 10*3/uL (ref 150–400)
RBC: 4.82 MIL/uL (ref 4.22–5.81)
RDW: 12.3 % (ref 11.5–15.5)
WBC: 12.9 10*3/uL — ABNORMAL HIGH (ref 4.0–10.5)
nRBC: 0 % (ref 0.0–0.2)

## 2022-03-17 LAB — COMPREHENSIVE METABOLIC PANEL
ALT: 26 U/L (ref 0–44)
AST: 16 U/L (ref 15–41)
Albumin: 3.9 g/dL (ref 3.5–5.0)
Alkaline Phosphatase: 44 U/L (ref 38–126)
Anion gap: 8 (ref 5–15)
BUN: 19 mg/dL (ref 6–20)
CO2: 28 mmol/L (ref 22–32)
Calcium: 10.1 mg/dL (ref 8.9–10.3)
Chloride: 104 mmol/L (ref 98–111)
Creatinine, Ser: 0.9 mg/dL (ref 0.61–1.24)
GFR, Estimated: >60 mL/min (ref >60)
Glucose, Bld: 114 mg/dL — ABNORMAL HIGH (ref 70–99)
Potassium: 4 mmol/L (ref 3.5–5.1)
Sodium: 140 mmol/L (ref 135–145)
Total Bilirubin: 0.5 mg/dL (ref 0.3–1.2)
Total Protein: 7.3 g/dL (ref 6.5–8.1)

## 2022-03-17 MED ORDER — TRIAMCINOLONE ACETONIDE 0.1 % EX CREA: 1.0000 | TOPICAL_CREAM | Freq: Two times a day (BID) | CUTANEOUS | 0 refills | Status: AC

## 2022-03-17 NOTE — ED Triage Notes (Addendum)
Pt states he has been treated for poison ivy to his right ankle. Pt states it has not improved and now has an area with yellow drainage and entire area on ankle is purple in color. Pt denies fevers.

## 2022-03-17 NOTE — ED Provider Notes (Signed)
[facility] DEPT Provider Note   CSN: [id number] Arrival date & time: 03/17/22  1459     History  Chief Complaint  Patient presents with   Poison Ivy    Shawn Edwards is a 41 y.o. male with past medical history significant for anxiety, depression who presents with concern for poison ivy to bilateral ankles that patient has been experiencing since Thursday.  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  Patient was seen evaluated by urgent care on Saturday, provided with prednisone taper, as well as doxycycline.  He reports that since then his rash is slightly worsened, he reports that he is having some blisters/bullae form because of itching them.  He reports that it is itchy but not particularly painful.  He denies any fever, chills.  He was told to come in for further evaluation due to the purpleish color of his rash.  Reports that he has had similar rash after poison ivy exposure before.   Poison Ivy       Home Medications Prior to Admission medications   Medication Sig Start Date End Date Taking? Authorizing Provider  triamcinolone cream (KENALOG) 0.1 % Apply 1 Application topically 2 (two) times daily. 03/17/22  Yes Shamia Uppal H, PA-C  albuterol (VENTOLIN HFA) 108 (90 Base) MCG/ACT inhaler Inhale 2 puffs into the lungs every 6 (six) hours as needed for wheezing or shortness of breath. 05/21/21   Maximiano Coss, NP  ALPRAZolam Duanne Moron) 0.5 MG tablet Take 1 tablet (0.5 mg total) by mouth at bedtime as needed for anxiety. 01/29/22   Maximiano Coss, NP  beclomethasone (QVAR REDIHALER) 80 MCG/ACT inhaler Inhale 1 puff into the lungs 2 (two) times daily. 05/21/21   Maximiano Coss, NP  butalbital-acetaminophen-caffeine (FIORICET) [phone] MG tablet TAKE 1 TABLET BY MOUTH EVERY 6 HOURS AS NEEDED FOR HEADACHE 11/17/21   Maximiano Coss, NP  cetirizine  (ZYRTEC) 10 MG tablet TAKE 1 TABLET(10 MG) BY MOUTH DAILY 01/29/22   Maximiano Coss, NP  cyclobenzaprine (FLEXERIL) 5 MG tablet Take 1 tablet (5 mg total) by mouth 3 (three) times daily as needed for muscle spasms. 06/25/21   Maximiano Coss, NP  fluticasone (FLONASE) 50 MCG/ACT nasal spray Place 2 sprays into both nostrils daily. 01/29/22   Maximiano Coss, NP  hydrOXYzine (ATARAX) 10 MG tablet Take 1 tablet (10 mg total) by mouth 3 (three) times daily as needed. 10/03/21   Maximiano Coss, NP  ketoconazole (NIZORAL) 2 % shampoo APPLY TOPICALLY 2 TIMES A WEEK 01/14/21   Maximiano Coss, NP  meclizine (ANTIVERT) 25 MG tablet Take 1 tablet (25 mg total) by mouth 3 (three) times daily as needed for dizziness or nausea. 01/29/22   Maximiano Coss, NP  metoprolol tartrate (LOPRESSOR) 100 MG tablet Take 1 tablet (100 mg total) by mouth once for 1 dose. Take 90-120 minutes prior to scan. 01/29/22 01/29/22  Maximiano Coss, NP  nortriptyline (PAMELOR) 50 MG capsule Take 1 capsule (50 mg total) by mouth at bedtime. 03/11/22   Wendie Agreste, MD  ondansetron (ZOFRAN) 4 MG tablet Take 1 tablet (4 mg total) by mouth 2 (two) times daily as needed for nausea or vomiting. 01/29/22   Maximiano Coss, NP  polyethylene glycol powder (GLYCOLAX/MIRALAX) 17 GM/SCOOP powder Take 17 g by mouth 2 (two) times daily as needed. 05/21/21   Maximiano Coss, NP  QUEtiapine (SEROQUEL) 25 MG tablet Take 1 tablet (25 mg  total) by mouth at bedtime. 09/18/21   Rodolph Bong, MD  scopolamine (TRANSDERM-SCOP, 1.5 MG,) 1 MG/3DAYS Place 1 patch (1.5 mg total) onto the skin every 3 (three) days. 01/29/22   Janeece Agee, NP      Allergies    Pork-derived products    Review of Systems   Review of Systems  Skin:  Positive for rash.  All other systems reviewed and are negative.   Physical Exam Updated Vital Signs BP 129/86 (BP Location: Left Arm)   Pulse 66   Temp 98.7 F (37.1 C) (Oral)   Resp 18   SpO2 94%  Physical Exam Vitals and  nursing note reviewed.  Constitutional:      General: He is not in acute distress.    Appearance: Normal appearance.  HENT:     Head: Normocephalic and atraumatic.  Eyes:     General:        Right eye: No discharge.        Left eye: No discharge.  Cardiovascular:     Rate and Rhythm: Normal rate and regular rhythm.  Pulmonary:     Effort: Pulmonary effort is normal. No respiratory distress.  Musculoskeletal:        General: No deformity.  Skin:    General: Skin is warm and dry.     Comments: Firm, plaque-like purple rash on bilateral ankles, right significantly worse than left with some bullae.  Negative Nikolsky sign.  No significant redness, cellulitis, purulent drainage noted.  Please see pictures for further evaluation.  Neurological:     Mental Status: He is alert and oriented to person, place, and time.  Psychiatric:        Mood and Affect: Mood normal.        Behavior: Behavior normal.          ED Results / Procedures / Treatments   Labs (all labs ordered are listed, but only abnormal results are displayed) Labs Reviewed  CBC WITH DIFFERENTIAL/PLATELET - Abnormal; Notable for the following components:      Result Value   WBC 12.9 (*)    Neutro Abs 10.7 (*)    Abs Immature Granulocytes 0.08 (*)    All other components within normal limits  COMPREHENSIVE METABOLIC PANEL - Abnormal; Notable for the following components:   Glucose, Bld 114 (*)    All other components within normal limits    EKG None  Radiology No results found.  Procedures Procedures    Medications Ordered in ED Medications - No data to display  ED Course/ Medical Decision Making/ A&P                           Medical Decision Making Risk Prescription drug management.   Patient with undifferentiated rash of bilateral ankles, presumed to be from poison ivy exposure.  He is already taking prednisone, doxycycline, reports that he has been trying to keep the rash dry, and take cold  showers, he reports that he is not been applying any steroid cream other than topical hydrocortisone from the pharmacy.  My emergent differential diagnosis includes poison ivy, oak, or other contact dermatitis versus bullous pemphigoid versus infectious rash, versus lichen planus, versus poison ivy with secondary versus prurigo nodule due to excessive itching other.  Less clinical concern for any emergent rash such as SJS, TN, versus other as patient has negative Nikolsky sign, no target lesions.  After examination I agree with urgent care  prescribed treatment, recommend patient continue his prednisone taper, doxycycline, will prescribe him some triamcinolone.  Encouraged him to wrap the lesions wet gauze/or socks with Vaseline, Aquaphor, CeraVe, Cetaphil or other gentle lotion.  Encouraged him to follow-up with dermatology if he is not having significant improvement of symptoms despite all of the above.  Patient is discharged in stable condition at this time, extensive return precautions given. Final Clinical Impression(s) / ED Diagnoses Final diagnoses:  Poison ivy dermatitis    Rx / DC Orders ED Discharge Orders          Ordered    triamcinolone cream (KENALOG) 0.1 %  2 times daily        03/17/22 2020              [place] 03/17/22 2212    Gwyneth Sprout, MD 03/17/22 2321

## 2022-03-17 NOTE — ED Provider Triage Note (Signed)
Emergency Medicine Provider Triage Evaluation Note  Shawn Edwards , a 41 y.o. male  was evaluated in triage.  Pt complains of rash to right ankle for the past few days. The patient was already seen at [facility] and placed on doxycycline and prednisone. When being re-evaluated, he was told to come into the ER. He reports the rash is itching. Denies any fever.  Review of Systems  Positive:  Negative:   Physical Exam  BP (!) 132/91   Pulse 79   Temp 99.2 F (37.3 C)   Resp 17   SpO2 98%  Gen:   Awake, no distress   Resp:  Normal effort  MSK:   Moves extremities without difficulty  Other:         Medical Decision Making  Medically screening exam initiated at 3:47 PM.  Appropriate orders placed.  Shawn Edwards was informed that the remainder of the evaluation will be completed by another provider, this initial triage assessment does not replace that evaluation, and the importance of remaining in the ED until their evaluation is complete.  Will order basic labs.   Achille Rich, [place] 03/17/22 1549

## 2022-03-17 NOTE — Discharge Instructions (Addendum)
Continue taking be prednisone that you are prescribed, take all 3 tablets at once for the rest of the 7 days, then 2 tablets at once for the next 7 days, then 1 tablet for the final 7 days.  Continue taking the doxycycline that you are prescribed.  You can use the new triamcinolone prescription I have given you topically up to twice daily on the affected area for up to 2 weeks at a time.  Avoid use on nonaffected skin.  Like we discussed you can soak some gauze or socks in either the triamcinolone or other cream or ointment such as Vaseline, Aquaphor, CeraVe, Cetaphil, these are all over-the-counter medications.  You want to keep the areas soaked because it will help with the dry rash and itching to allow the area to stay wet.  I have attached the name of a dermatologist, recommend that you see them for further evaluation and probable biopsy of the affected area if your symptoms are ongoing.  ????? ?? ????? ????????? ??????? ??? ?????? ??????? ??????? ??? ????? ????? ?????? ??????? ?? ????? ??? ????? ???? ?????? ?????? ???????? ?? ????? ?????? ???? ?????? ?????? ???????. ????? ?? ????? ????????????? ??????? ??. ????? ??????? ???? ?????????????? ??????? ???? ??????? ?? ??????? ??? ??? ??? ????? ?????? ??? ??????? ??????? ???? ??? ??? ??????? ?? ????? ???????. ???? ???????? ??? ????? ??? ??????.  ??? ??????? ????? ??? ??? ????? ?? ??????? ?? ?????????????? ?? ?? ???? ?? ???? ??? ??? ????????? ?? ?????????? ?? ??????? ?? ???????? ????? ????? ?? ?????? ???? ????. ??? ???? ?? ???? ??????? ????? ??? ??? ?????? ?? ???? ????? ?????? ????? ?????? ?????? ??????? ??????? ????.  ??? ????? ??? ???? ??????? ???????? ????? ??????? ?????? ???? ?? ??????? ???? ???? ?????? ??????? ??????? ??? ???? ??????? ??????.

## 2022-04-22 ENCOUNTER — Ambulatory Visit
Admission: EM | Admit: 2022-04-22 | Discharge: 2022-04-22 | Disposition: A | Discharge status: Home / Self Care | Payer: BC Managed Care – PPO | Attending: Physician Assistant | Admitting: Physician Assistant

## 2022-04-22 DIAGNOSIS — L309 Dermatitis, unspecified: Secondary | ICD-10-CM

## 2022-04-22 MED ORDER — PREDNISONE 20 MG PO TABS: 40.0000 mg | ORAL_TABLET | Freq: Every day | ORAL | 0 refills | Status: AC

## 2022-04-22 MED ORDER — TRIAMCINOLONE ACETONIDE 0.5 % EX OINT: 1.0000 | TOPICAL_OINTMENT | Freq: Two times a day (BID) | CUTANEOUS | 0 refills | Status: AC
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
## 2022-04-22 NOTE — ED Triage Notes (Signed)
Pt c/o exposure to poison ivy to right leg 4 weeks ago. He states he was prescribed prednisone and is currently taking it. The patient states he is still having irritation to the left leg.

## 2022-04-22 NOTE — ED Notes (Signed)
Patient educated on new prescribed medication (frequency, dose, and to stop old prednisone & triamcinolone prescription).

## 2022-05-01 ENCOUNTER — Other Ambulatory Visit: Payer: Self-pay | Admitting: Lab

## 2022-05-01 DIAGNOSIS — R112 Nausea with vomiting, unspecified: Secondary | ICD-10-CM

## 2022-05-01 MED ORDER — ONDANSETRON HCL 4 MG PO TABS: 4.0000 mg | ORAL_TABLET | Freq: Two times a day (BID) | ORAL | 0 refills | Status: AC | PRN

## 2022-05-01 NOTE — Progress Notes (Unsigned)
Called and informed pt that zofran has been called in

## 2022-05-14 ENCOUNTER — Telehealth (HOSPITAL_COMMUNITY): Payer: Self-pay | Admitting: *Deleted

## 2022-05-14 NOTE — Telephone Encounter (Signed)
Attempted to call patient through an [place] interpreter Mordecai Rasmussen, ID# [phone]) regarding upcoming cardiac CT appointment. Left message on voicemail with name and callback number  Gordy Clement RN Navigator Cardiac [facility] [phone] Office [phone] Cell

## 2022-05-15 ENCOUNTER — Ambulatory Visit (HOSPITAL_COMMUNITY): Admission: RE | Admit: 2022-05-15 | Payer: BC Managed Care – PPO | Source: Ambulatory Visit

## 2022-06-05 ENCOUNTER — Other Ambulatory Visit: Payer: Self-pay | Admitting: Family Medicine

## 2022-06-05 DIAGNOSIS — G44329 Chronic post-traumatic headache, not intractable: Secondary | ICD-10-CM

## 2022-06-05 DIAGNOSIS — G479 Sleep disorder, unspecified: Secondary | ICD-10-CM

## 2022-06-05 DIAGNOSIS — R42 Dizziness and giddiness: Secondary | ICD-10-CM

## 2022-06-05 NOTE — Telephone Encounter (Signed)
Mackenzine sent the pt a list of providers on 05/26/22 now the pt wants a refill on the Nortriptyline 50 mg
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
## 2022-06-25 ENCOUNTER — Ambulatory Visit: Payer: BC Managed Care – PPO | Attending: Cardiology | Admitting: Cardiology

## 2022-06-25 ENCOUNTER — Encounter: Payer: Self-pay | Admitting: *Deleted

## 2022-06-25 ENCOUNTER — Encounter: Payer: Self-pay | Admitting: Cardiology

## 2022-06-25 VITALS — BP 122/82 | HR 85 | Ht 68.0 in | Wt 165.6 lb

## 2022-06-25 DIAGNOSIS — R079 Chest pain, unspecified: Secondary | ICD-10-CM

## 2022-06-25 NOTE — Patient Instructions (Signed)
Medication Instructions: NO CHANGES *If you need a refill on your cardiac medications before your next appointment, please call your pharmacy*   Lab Work: LIPID SAME DAYS AS TREADMILL  If you have labs (blood work) drawn today and your tests are completely normal, you will receive your results only by: MyChart Message (if you have MyChart) OR A paper copy in the mail If you have any lab test that is abnormal or we need to change your treatment, we will call you to review the results.   Testing/Procedures: Your physician has requested that you have an exercise tolerance test. For further information please visit [email or web address] Please also follow instruction sheet, as given.    Follow-Up: At [facility], you and your health needs are our priority.  As part of our continuing mission to provide you with exceptional heart care, we have created designated Provider Care Teams.  These Care Teams include your primary Cardiologist (physician) and Advanced Practice Providers (APPs -  Physician Assistants and Nurse Practitioners) who all work together to provide you with the care you need, when you need it.  We recommend signing up for the patient portal called "MyChart".  Sign up information is provided on this After Visit Summary.  MyChart is used to connect with patients for Virtual Visits (Telemedicine).  Patients are able to view lab/test results, encounter notes, upcoming appointments, etc.  Non-urgent messages can be sent to your provider as well.   To learn more about what you can do with MyChart, go to [email or web address].    Your next appointment:   6 month(s)  The format for your next appointment:   In Person  Provider:  DR Shari Prows   Other Instructions NONE  Important Information About Sugar

## 2022-07-02 NOTE — Addendum Note (Signed)
Addended by: Loa Socks on: 07/02/2022 03:46 PM   Modules accepted: Orders

## 2022-07-06 ENCOUNTER — Other Ambulatory Visit: Payer: Self-pay | Admitting: Cardiology

## 2022-07-06 DIAGNOSIS — R079 Chest pain, unspecified: Secondary | ICD-10-CM

## 2022-07-06 DIAGNOSIS — R072 Precordial pain: Secondary | ICD-10-CM

## 2022-07-09 ENCOUNTER — Ambulatory Visit: Payer: BC Managed Care – PPO

## 2022-07-09 ENCOUNTER — Ambulatory Visit: Payer: BC Managed Care – PPO | Attending: Cardiology

## 2022-07-09 DIAGNOSIS — R079 Chest pain, unspecified: Secondary | ICD-10-CM | POA: Diagnosis not present

## 2022-07-09 LAB — LIPID PANEL
Chol/HDL Ratio: 3.3 ratio (ref 0.0–5.0)
Cholesterol, Total: 190 mg/dL (ref 100–199)
HDL: 58 mg/dL (ref >39)
LDL Chol Calc (NIH): 118 mg/dL — ABNORMAL HIGH (ref 0–99)
Triglycerides: 74 mg/dL (ref 0–149)
VLDL Cholesterol Cal: 14 mg/dL (ref 5–40)

## 2022-07-09 LAB — EXERCISE TOLERANCE TEST
Angina Index: 0
Estimated workload: 11.6
Exercise duration (min): 10 min
Exercise duration (sec): 0 s
MPHR: 178 {beats}/min
Peak HR: 164 {beats}/min
Percent HR: 92 %
RPE: 16
Rest HR: 75 {beats}/min

## 2022-07-10 ENCOUNTER — Telehealth: Payer: Self-pay | Admitting: Cardiology

## 2022-07-10 DIAGNOSIS — R9431 Abnormal electrocardiogram [ECG] [EKG]: Secondary | ICD-10-CM

## 2022-07-10 DIAGNOSIS — R079 Chest pain, unspecified: Secondary | ICD-10-CM

## 2022-07-10 NOTE — Telephone Encounter (Signed)
-----   Message from Quintella Reichert, MD sent at 07/10/2022  8:59 AM EST ----- LDL ok unless he has CAD.  SInce he did not get his coronary CTA it is difficult to assess future cardiac risk - please order a coronary Ca score and if coronary Ca present then would recommend statin therapy

## 2022-07-10 NOTE — Telephone Encounter (Signed)
Pt states he received a call, not sure who it was.

## 2022-08-21 ENCOUNTER — Telehealth: Payer: Self-pay | Admitting: *Deleted

## 2022-08-21 NOTE — Telephone Encounter (Signed)
[place], [place] Triage FYI,  After 3 attempts to schedule CT cardiac score and no answer or return calls from pt, this order has been closed.  He is a patient of Dr Radford Pax.

## 2022-11-22 NOTE — Addendum Note (Signed)
Addended by: Laurance Flatten on: 11/22/2022 09:09 PM   Modules accepted: Orders
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
## 2022-12-25 ENCOUNTER — Ambulatory Visit: Payer: BC Managed Care – PPO | Attending: Cardiology | Admitting: Cardiology

## 2022-12-25 ENCOUNTER — Encounter: Payer: Self-pay | Admitting: Cardiology
